# Patient Record
Sex: Male | Born: 1955 | Race: White | Hispanic: No | Smoking: Current every day smoker
Health system: Southern US, Community
[De-identification: ages and names within clinical notes are randomized; demographics above are authoritative.]

## PROBLEM LIST (undated history)

## (undated) DIAGNOSIS — I700 Atherosclerosis of aorta: Secondary | ICD-10-CM

## (undated) DIAGNOSIS — Z8673 Personal history of transient ischemic attack (TIA), and cerebral infarction without residual deficits: Secondary | ICD-10-CM

## (undated) DIAGNOSIS — M1990 Unspecified osteoarthritis, unspecified site: Secondary | ICD-10-CM

## (undated) DIAGNOSIS — IMO0001 Reserved for inherently not codable concepts without codable children: Secondary | ICD-10-CM

## (undated) DIAGNOSIS — F419 Anxiety disorder, unspecified: Secondary | ICD-10-CM

## (undated) DIAGNOSIS — I10 Essential (primary) hypertension: Secondary | ICD-10-CM

## (undated) DIAGNOSIS — I639 Cerebral infarction, unspecified: Secondary | ICD-10-CM

## (undated) DIAGNOSIS — F1910 Other psychoactive substance abuse, uncomplicated: Secondary | ICD-10-CM

## (undated) DIAGNOSIS — M549 Dorsalgia, unspecified: Secondary | ICD-10-CM

## (undated) DIAGNOSIS — R569 Unspecified convulsions: Secondary | ICD-10-CM

## (undated) DIAGNOSIS — H9190 Unspecified hearing loss, unspecified ear: Secondary | ICD-10-CM

## (undated) HISTORY — PX: INNER EAR SURGERY: SHX679

## (undated) HISTORY — DX: Atherosclerosis of aorta: I70.0

## (undated) HISTORY — DX: Personal history of transient ischemic attack (TIA), and cerebral infarction without residual deficits: Z86.73

---

## 1991-03-24 HISTORY — PX: BACK SURGERY: SHX140

## 1998-09-03 ENCOUNTER — Ambulatory Visit (HOSPITAL_COMMUNITY): Admission: RE | Admit: 1998-09-03 | Discharge: 1998-09-03 | Payer: Self-pay | Admitting: Neurosurgery

## 1998-09-03 ENCOUNTER — Encounter: Payer: Self-pay | Admitting: Neurosurgery

## 2001-03-23 HISTORY — PX: FINGER SURGERY: SHX640

## 2008-01-16 ENCOUNTER — Emergency Department (HOSPITAL_COMMUNITY): Admission: EM | Admit: 2008-01-16 | Discharge: 2008-01-16 | Payer: Self-pay | Admitting: Emergency Medicine

## 2008-06-23 ENCOUNTER — Emergency Department (HOSPITAL_COMMUNITY): Admission: EM | Admit: 2008-06-23 | Discharge: 2008-06-23 | Payer: Self-pay | Admitting: Emergency Medicine

## 2009-02-01 ENCOUNTER — Emergency Department (HOSPITAL_COMMUNITY): Admission: EM | Admit: 2009-02-01 | Discharge: 2009-02-02 | Payer: Self-pay | Admitting: Emergency Medicine

## 2010-04-16 ENCOUNTER — Emergency Department (HOSPITAL_COMMUNITY)
Admission: EM | Admit: 2010-04-16 | Discharge: 2010-04-16 | Payer: Self-pay | Source: Home / Self Care | Admitting: Emergency Medicine

## 2010-06-25 LAB — CBC
HCT: 47.4 % (ref 39.0–52.0)
Hemoglobin: 16.4 g/dL (ref 13.0–17.0)
MCHC: 34.5 g/dL (ref 30.0–36.0)
RDW: 13.8 % (ref 11.5–15.5)

## 2010-06-25 LAB — BASIC METABOLIC PANEL
CO2: 28 mEq/L (ref 19–32)
Calcium: 9.5 mg/dL (ref 8.4–10.5)
Glucose, Bld: 86 mg/dL (ref 70–99)
Sodium: 135 mEq/L (ref 135–145)

## 2010-06-25 LAB — RAPID URINE DRUG SCREEN, HOSP PERFORMED
Amphetamines: NOT DETECTED
Barbiturates: NOT DETECTED
Benzodiazepines: POSITIVE — AB
Cocaine: NOT DETECTED
Opiates: NOT DETECTED
Tetrahydrocannabinol: NOT DETECTED

## 2010-06-25 LAB — DIFFERENTIAL
Basophils Absolute: 0.1 10*3/uL (ref 0.0–0.1)
Basophils Relative: 1 % (ref 0–1)
Eosinophils Relative: 3 % (ref 0–5)
Monocytes Absolute: 0.7 10*3/uL (ref 0.1–1.0)

## 2011-06-05 ENCOUNTER — Other Ambulatory Visit (HOSPITAL_COMMUNITY): Payer: Self-pay | Admitting: Orthopaedic Surgery

## 2011-06-08 ENCOUNTER — Encounter (HOSPITAL_COMMUNITY): Payer: Self-pay | Admitting: Pharmacy Technician

## 2011-06-10 ENCOUNTER — Encounter (HOSPITAL_COMMUNITY): Payer: Self-pay

## 2011-06-10 ENCOUNTER — Encounter (HOSPITAL_COMMUNITY)
Admission: RE | Admit: 2011-06-10 | Discharge: 2011-06-10 | Disposition: A | Discharge status: Home / Self Care | Payer: PRIVATE HEALTH INSURANCE | Source: Ambulatory Visit | Attending: Orthopaedic Surgery | Admitting: Orthopaedic Surgery

## 2011-06-10 ENCOUNTER — Ambulatory Visit (HOSPITAL_COMMUNITY)
Admission: RE | Admit: 2011-06-10 | Discharge: 2011-06-10 | Disposition: A | Discharge status: Home / Self Care | Payer: PRIVATE HEALTH INSURANCE | Source: Ambulatory Visit | Attending: Anesthesiology | Admitting: Anesthesiology

## 2011-06-10 ENCOUNTER — Other Ambulatory Visit: Payer: Self-pay

## 2011-06-10 DIAGNOSIS — S43429A Sprain of unspecified rotator cuff capsule, initial encounter: Secondary | ICD-10-CM | POA: Insufficient documentation

## 2011-06-10 DIAGNOSIS — X58XXXA Exposure to other specified factors, initial encounter: Secondary | ICD-10-CM | POA: Insufficient documentation

## 2011-06-10 DIAGNOSIS — Z01818 Encounter for other preprocedural examination: Secondary | ICD-10-CM | POA: Insufficient documentation

## 2011-06-10 HISTORY — DX: Unspecified osteoarthritis, unspecified site: M19.90

## 2011-06-10 HISTORY — DX: Unspecified convulsions: R56.9

## 2011-06-10 HISTORY — DX: Unspecified hearing loss, unspecified ear: H91.90

## 2011-06-10 HISTORY — DX: Reserved for inherently not codable concepts without codable children: IMO0001

## 2011-06-10 HISTORY — DX: Dorsalgia, unspecified: M54.9

## 2011-06-10 HISTORY — DX: Anxiety disorder, unspecified: F41.9

## 2011-06-10 LAB — CBC
HCT: 41.9 % (ref 39.0–52.0)
MCHC: 35.1 g/dL (ref 30.0–36.0)
MCV: 96.1 fL (ref 78.0–100.0)
RDW: 12.2 % (ref 11.5–15.5)

## 2011-06-10 LAB — BASIC METABOLIC PANEL
BUN: 14 mg/dL (ref 6–23)
CO2: 30 mEq/L (ref 19–32)
Chloride: 91 mEq/L — ABNORMAL LOW (ref 96–112)
Creatinine, Ser: 1.06 mg/dL (ref 0.50–1.35)

## 2011-06-10 NOTE — Pre-Procedure Instructions (Signed)
20 BUD KAESER  06/10/2011   Your procedure is scheduled on:  Tues, Mar 26 @ 1230PM  Report to [facility] at 1030AM.  Call this number if you have problems the morning of surgery: [phone]   Remember:   Do not eat food:After Midnight.  May have clear liquids: up to 4 Hours before arrival.  Clear liquids include soda, tea, black coffee, apple or grape juice, broth.  Take these medicines the morning of surgery with A SIP OF WATER: Tenoretic,Valium,and Dilantin   Do not wear jewelry, make-up or nail polish.  Do not wear lotions, powders, or perfumes.    Do not bring valuables to the hospital.   Contacts, dentures or bridgework may not be worn into surgery.  Leave suitcase in the car. After surgery it may be brought to your room.  For patients admitted to the hospital, checkout time is 11:00 AM the day of discharge.   Patients discharged the day of surgery will not be allowed to drive home.    Special Instructions: CHG Shower Use Special Wash: 1/2 bottle night before surgery and 1/2 bottle morning of surgery.   Please read over the following fact sheets that you were given: Pain Booklet, Coughing and Deep Breathing, MRSA Information and Surgical Site Infection Prevention

## 2011-06-10 NOTE — Progress Notes (Deleted)
Pt denies ever having a heart attack but the health dept did an ekg about 6-62yrs ago and told him that he had had one

## 2011-06-10 NOTE — Progress Notes (Signed)
Pt doesn't have a cardiologist  Pt goes to [facility] for b/p meds  Denies having a heart cath/echo/stress test

## 2011-06-10 NOTE — Pre-Procedure Instructions (Signed)
20 Robert Walls  06/10/2011   Your procedure is scheduled on:  Tues, Mar 26 @ 1230pm  Report to [facility] at 1030 AM.  Call this number if you have problems the morning of surgery: [phone]   Remember:   Do not eat food:After Midnight.  May have clear liquids: up to 4 Hours before arrival.(until 6:30 am)  Clear liquids include soda, tea, black coffee, apple or grape juice, broth,water  Take these medicines the morning of surgery with A SIP OF WATER: Tenoretic,Dilantin,and Valium   Do not wear jewelry, make-up or nail polish.  Do not wear lotions, powders, or perfumes. You may wear deodorant.  Do not shave 48 hours prior to surgery.  Do not bring valuables to the hospital.  Contacts, dentures or bridgework may not be worn into surgery.  Leave suitcase in the car. After surgery it may be brought to your room.  For patients admitted to the hospital, checkout time is 11:00 AM the day of discharge.   Patients discharged the day of surgery will not be allowed to drive home.    Special Instructions: CHG Shower Use Special Wash: 1/2 bottle night before surgery and 1/2 bottle morning of surgery.   Please read over the following fact sheets that you were given: Pain Booklet, Coughing and Deep Breathing, MRSA Information and Surgical Site Infection Prevention

## 2011-06-10 NOTE — Progress Notes (Addendum)
Pt denies ever having a heart attack but states that 6-60yrs ago he had an EKG at the [facility] in [facility] that told him that he had   Requested ekg from [facility] for comparsion

## 2011-06-10 NOTE — Consult Note (Signed)
Anesthesia:  Patient is a 56 year old male scheduled for a right shoulder arthroscopy with debridement and possible rotator cuff tear repair on 06/16/11.  His history includes smoking, HTN, seizure (reportedly when he stopped Xanax too abruptly) and is now on Dilantin, history of concussion and spinal fracture in '93 after falling from a tree, anxiety, impaired hearing, arthritis, prior back surgery '93.  His PCP is through the [facility] Department.  Labs noted.  CXR no acute cardiopulmonary process.  EKG shows NSR, minimal voltage criteria for LVH, inferior T wave abnormality (III, avF).  These changes were present on an EKG from 06/25/10, but less apparent in 2010.  I called and spoke with Mr. Kimmons.  He denies CP, SOB.  He is not terribly active but continues to work in remodeling and can go up a flight of stairs without difficulty.    I reviewed above with Anesthesiologist Dr. Randa Evens.  Since he has had fairly recent (July 2012) and on-going evaluation by his PCP, has been asymptomatic, METS is >4 (probably a 5), and a stable EKG since last year, would plan to proceed unless there is a change in his status.

## 2011-06-10 NOTE — Progress Notes (Signed)
Per health dept no ekg from 6-60yrs ago on file but does have one from 2010/2012-to fax along with last visit

## 2011-06-15 MED ORDER — CEFAZOLIN SODIUM 1-5 GM-% IV SOLN
1.0000 g | INTRAVENOUS | Status: AC
  Administered 2011-06-16: 1 g via INTRAVENOUS
  Filled 2011-06-15: qty 50

## 2011-06-16 ENCOUNTER — Encounter (HOSPITAL_COMMUNITY): Payer: Self-pay | Admitting: Vascular Surgery

## 2011-06-16 ENCOUNTER — Ambulatory Visit (HOSPITAL_COMMUNITY)
Admission: RE | Admit: 2011-06-16 | Discharge: 2011-06-16 | Disposition: A | Discharge status: Home / Self Care | Payer: PRIVATE HEALTH INSURANCE | Source: Ambulatory Visit | Attending: Orthopaedic Surgery | Admitting: Orthopaedic Surgery

## 2011-06-16 ENCOUNTER — Encounter (HOSPITAL_COMMUNITY)
Admission: RE | Disposition: A | Discharge status: Home / Self Care | Payer: Self-pay | Source: Ambulatory Visit | Attending: Orthopaedic Surgery

## 2011-06-16 ENCOUNTER — Encounter (HOSPITAL_COMMUNITY): Payer: Self-pay | Admitting: *Deleted

## 2011-06-16 ENCOUNTER — Ambulatory Visit (HOSPITAL_COMMUNITY): Payer: PRIVATE HEALTH INSURANCE | Admitting: Vascular Surgery

## 2011-06-16 DIAGNOSIS — I10 Essential (primary) hypertension: Secondary | ICD-10-CM | POA: Insufficient documentation

## 2011-06-16 DIAGNOSIS — R569 Unspecified convulsions: Secondary | ICD-10-CM | POA: Insufficient documentation

## 2011-06-16 DIAGNOSIS — H9190 Unspecified hearing loss, unspecified ear: Secondary | ICD-10-CM | POA: Insufficient documentation

## 2011-06-16 DIAGNOSIS — F411 Generalized anxiety disorder: Secondary | ICD-10-CM | POA: Insufficient documentation

## 2011-06-16 DIAGNOSIS — M75101 Unspecified rotator cuff tear or rupture of right shoulder, not specified as traumatic: Secondary | ICD-10-CM

## 2011-06-16 DIAGNOSIS — M75120 Complete rotator cuff tear or rupture of unspecified shoulder, not specified as traumatic: Secondary | ICD-10-CM | POA: Insufficient documentation

## 2011-06-16 DIAGNOSIS — M12811 Other specific arthropathies, not elsewhere classified, right shoulder: Secondary | ICD-10-CM

## 2011-06-16 SURGERY — SHOULDER ARTHROSCOPY WITH ROTATOR CUFF REPAIR AND SUBACROMIAL DECOMPRESSION
Anesthesia: General | Site: Shoulder | Laterality: Right | Wound class: Clean

## 2011-06-16 MED ORDER — HYDROMORPHONE HCL PF 1 MG/ML IJ SOLN
0.2500 mg | INTRAMUSCULAR | Status: DC | PRN
  Administered 2011-06-16 (×4): 0.5 mg via INTRAVENOUS

## 2011-06-16 MED ORDER — PROPOFOL 10 MG/ML IV EMUL
INTRAVENOUS | Status: DC | PRN
  Administered 2011-06-16: 200 mg via INTRAVENOUS

## 2011-06-16 MED ORDER — ROCURONIUM BROMIDE 100 MG/10ML IV SOLN
INTRAVENOUS | Status: DC | PRN
  Administered 2011-06-16: 50 mg via INTRAVENOUS

## 2011-06-16 MED ORDER — FENTANYL CITRATE 0.05 MG/ML IJ SOLN
INTRAMUSCULAR | Status: DC | PRN
  Administered 2011-06-16: 50 ug via INTRAVENOUS
  Administered 2011-06-16 (×2): 100 ug via INTRAVENOUS

## 2011-06-16 MED ORDER — HYDROMORPHONE HCL PF 1 MG/ML IJ SOLN
INTRAMUSCULAR | Status: AC
  Filled 2011-06-16: qty 1

## 2011-06-16 MED ORDER — MIDAZOLAM HCL 5 MG/5ML IJ SOLN
INTRAMUSCULAR | Status: DC | PRN
  Administered 2011-06-16: 2 mg via INTRAVENOUS

## 2011-06-16 MED ORDER — VECURONIUM BROMIDE 10 MG IV SOLR
INTRAVENOUS | Status: DC | PRN
  Administered 2011-06-16: 2 mg via INTRAVENOUS
  Administered 2011-06-16: 3 mg via INTRAVENOUS
  Administered 2011-06-16: 1 mg via INTRAVENOUS

## 2011-06-16 MED ORDER — NEOSTIGMINE METHYLSULFATE 1 MG/ML IJ SOLN
INTRAMUSCULAR | Status: DC | PRN
  Administered 2011-06-16: 4 mg via INTRAVENOUS

## 2011-06-16 MED ORDER — BUPIVACAINE-EPINEPHRINE PF 0.5-1:200000 % IJ SOLN
INTRAMUSCULAR | Status: DC | PRN
  Administered 2011-06-16: 25 mL

## 2011-06-16 MED ORDER — ONDANSETRON HCL 4 MG/2ML IJ SOLN: 4.0000 mg | Freq: Once | INTRAMUSCULAR | Status: DC | PRN

## 2011-06-16 MED ORDER — LACTATED RINGERS IV SOLN
INTRAVENOUS | Status: DC
  Administered 2011-06-16 (×2): via INTRAVENOUS

## 2011-06-16 MED ORDER — GLYCOPYRROLATE 0.2 MG/ML IJ SOLN
INTRAMUSCULAR | Status: DC | PRN
  Administered 2011-06-16: 0.6 mg via INTRAVENOUS

## 2011-06-16 MED ORDER — OXYCODONE-ACETAMINOPHEN 5-325 MG PO TABS: 1.0000 | ORAL_TABLET | ORAL | Status: AC | PRN

## 2011-06-16 MED ORDER — SODIUM CHLORIDE 0.9 % IR SOLN
Status: DC | PRN
  Administered 2011-06-16: 3000 mL

## 2011-06-16 MED ORDER — EPHEDRINE SULFATE 50 MG/ML IJ SOLN
INTRAMUSCULAR | Status: DC | PRN
  Administered 2011-06-16: 15 mg via INTRAVENOUS
  Administered 2011-06-16 (×4): 5 mg via INTRAVENOUS

## 2011-06-16 MED ORDER — MORPHINE SULFATE 2 MG/ML IJ SOLN: 0.0500 mg/kg | INTRAMUSCULAR | Status: DC | PRN

## 2011-06-16 MED ORDER — ONDANSETRON HCL 4 MG/2ML IJ SOLN
INTRAMUSCULAR | Status: DC | PRN
  Administered 2011-06-16: 4 mg via INTRAVENOUS

## 2011-06-16 SURGICAL SUPPLY — 59 items
ANCH SUT 4.5 SUBCORTICAL WNG (Anchor) ×1 IMPLANT
ANCHOR PALADIN W/HIFI SUT 5.0 (Anchor) ×1 IMPLANT
ANCHOR SUT POPLOK 4.5 (Anchor) ×1 IMPLANT
BLADE CUDA 4.2 (BLADE) ×2 IMPLANT
BLADE SURG 11 STRL SS (BLADE) ×2 IMPLANT
BLADE SURG ROTATE 9660 (MISCELLANEOUS) IMPLANT
BUR OVAL 6.0 (BURR) ×1 IMPLANT
BUR VERTEX HOODED 4.5 (BURR) IMPLANT
CANNULA 5.75X7 CRYSTAL CLEAR (CANNULA) ×1 IMPLANT
CANNULA SHOULDER 7CM (CANNULA) ×4 IMPLANT
CANNULA TWIST IN 8.25X7CM (CANNULA) ×1 IMPLANT
CLOTH BEACON ORANGE TIMEOUT ST (SAFETY) ×2 IMPLANT
COVER SURGICAL LIGHT HANDLE (MISCELLANEOUS) ×2 IMPLANT
DECANTER SPIKE VIAL GLASS SM (MISCELLANEOUS) IMPLANT
DRAPE INCISE IOBAN 66X45 STRL (DRAPES) ×1 IMPLANT
DRAPE SHOULDER BEACH CHAIR (DRAPES) ×2 IMPLANT
DRAPE SURG 17X23 STRL (DRAPES) ×2 IMPLANT
DRAPE U-SHAPE 47X51 STRL (DRAPES) ×2 IMPLANT
DRSG PAD ABDOMINAL 8X10 ST (GAUZE/BANDAGES/DRESSINGS) ×3 IMPLANT
DURAPREP 26ML APPLICATOR (WOUND CARE) ×2 IMPLANT
ELECT REM PT RETURN 9FT ADLT (ELECTROSURGICAL)
ELECTRODE REM PT RTRN 9FT ADLT (ELECTROSURGICAL) IMPLANT
GAUZE XEROFORM 1X8 LF (GAUZE/BANDAGES/DRESSINGS) ×2 IMPLANT
GLOVE BIOGEL PI IND STRL 8 (GLOVE) ×1 IMPLANT
GLOVE BIOGEL PI INDICATOR 8 (GLOVE) ×1
GLOVE ORTHO TXT STRL SZ7.5 (GLOVE) ×2 IMPLANT
GOWN PREVENTION PLUS LG XLONG (DISPOSABLE) IMPLANT
GOWN PREVENTION PLUS XLARGE (GOWN DISPOSABLE) ×2 IMPLANT
GOWN STRL NON-REIN LRG LVL3 (GOWN DISPOSABLE) ×4 IMPLANT
KIT BASIN OR (CUSTOM PROCEDURE TRAY) ×2 IMPLANT
KIT ROOM TURNOVER OR (KITS) ×2 IMPLANT
KIT SHOULDER TRACTION (DRAPES) ×2 IMPLANT
MANIFOLD NEPTUNE II (INSTRUMENTS) ×2 IMPLANT
NDL 1/2 CIR CATGUT .05X1.09 (NEEDLE) IMPLANT
NDL HYPO 25GX1X1/2 BEV (NEEDLE) IMPLANT
NDL SCORPION (NEEDLE) IMPLANT
NDL SCORPION MULTI FIRE (NEEDLE) IMPLANT
NDL SPNL 18GX3.5 QUINCKE PK (NEEDLE) ×1 IMPLANT
NEEDLE 1/2 CIR CATGUT .05X1.09 (NEEDLE) IMPLANT
NEEDLE HYPO 25GX1X1/2 BEV (NEEDLE) IMPLANT
NEEDLE SCORPION (NEEDLE) IMPLANT
NEEDLE SCORPION MULTI FIRE (NEEDLE) ×2 IMPLANT
NEEDLE SPNL 18GX3.5 QUINCKE PK (NEEDLE) ×2 IMPLANT
NS IRRIG 1000ML POUR BTL (IV SOLUTION) ×2 IMPLANT
PACK SHOULDER (CUSTOM PROCEDURE TRAY) ×2 IMPLANT
PAD ARMBOARD 7.5X6 YLW CONV (MISCELLANEOUS) ×4 IMPLANT
SET ARTHROSCOPY TUBING (MISCELLANEOUS) ×2
SET ARTHROSCOPY TUBING LN (MISCELLANEOUS) ×1 IMPLANT
SPONGE GAUZE 4X4 12PLY (GAUZE/BANDAGES/DRESSINGS) ×2 IMPLANT
SPONGE LAP 4X18 X RAY DECT (DISPOSABLE) ×2 IMPLANT
STAPLER VISISTAT 35W (STAPLE) IMPLANT
STRIP CLOSURE SKIN 1/2X4 (GAUZE/BANDAGES/DRESSINGS) IMPLANT
SUT ETHILON 3 0 PS 1 (SUTURE) ×1 IMPLANT
SYR CONTROL 10ML LL (SYRINGE) ×1 IMPLANT
TAPE CLOTH SURG 6X10 WHT LF (GAUZE/BANDAGES/DRESSINGS) ×1 IMPLANT
TOWEL OR 17X24 6PK STRL BLUE (TOWEL DISPOSABLE) ×2 IMPLANT
TOWEL OR 17X26 10 PK STRL BLUE (TOWEL DISPOSABLE) ×2 IMPLANT
WAND 90 DEG TURBOVAC W/CORD (SURGICAL WAND) ×2 IMPLANT
WATER STERILE IRR 1000ML POUR (IV SOLUTION) ×2 IMPLANT

## 2011-06-16 NOTE — Progress Notes (Signed)
Orthopedic Tech Progress Note Patient Details:  Robert Walls August 31, 1955 [id number]  Other Ortho Devices Type of Ortho Device: Other (comment) Ortho Device Location: [name] shoulder sling Ortho Device Interventions: Application   [name] T 06/16/2011, 2:36 PM

## 2011-06-16 NOTE — Brief Op Note (Signed)
06/16/2011  2:30 PM  PATIENT:  Robert Walls  56 y.o. male  PRE-OPERATIVE DIAGNOSIS:  Right shoulder rotator cuff tear  POST-OPERATIVE DIAGNOSIS:  Right shoulder rotator cuff tear  PROCEDURE:  Procedure(s) (LRB): SHOULDER ARTHROSCOPY WITH ROTATOR CUFF REPAIR AND SUBACROMIAL DECOMPRESSION (Right)  SURGEON:  Surgeon(s) and Role:    * Kathryne Hitch, MD - Primary  PHYSICIAN ASSISTANT:   ASSISTANTS: none   ANESTHESIA:   regional and general  EBL:  Total I/O In: 1000 [I.V.:1000] Out: 50 [Blood:50]  BLOOD ADMINISTERED:none  DRAINS: none   LOCAL MEDICATIONS USED:  NONE  SPECIMEN:  No Specimen  DISPOSITION OF SPECIMEN:  N/A  COUNTS:  YES  TOURNIQUET:  * No tourniquets in log *  DICTATION: .Other Dictation: Dictation Number [id number]  PLAN OF CARE: Discharge to home after PACU  PATIENT DISPOSITION:  PACU - hemodynamically stable.   Delay start of Pharmacological VTE agent (>24hrs) due to surgical blood loss or risk of bleeding: not applicable

## 2011-06-16 NOTE — Preoperative (Signed)
Beta Blockers   Reason not to administer Beta Blockers:Not Applicable, last dose 06/16/11 at 05:30

## 2011-06-16 NOTE — Anesthesia Preprocedure Evaluation (Addendum)
Anesthesia Evaluation  Patient identified by MRN, date of birth, ID band Patient awake    Reviewed: Allergy & Precautions, H&P , NPO status , Patient's Chart, lab work & pertinent test results  Airway Mallampati: II TM Distance: >3 FB Neck ROM: Full    Dental  (+) Teeth Intact and Dental Advisory Given   Pulmonary  breath sounds clear to auscultation        Cardiovascular hypertension, Pt. on medications Rhythm:Regular Rate:Normal     Neuro/Psych Seizures -, Well Controlled,  Anxiety    GI/Hepatic   Endo/Other    Renal/GU      Musculoskeletal   Abdominal   Peds  Hematology   Anesthesia Other Findings   Reproductive/Obstetrics                          Anesthesia Physical Anesthesia Plan  ASA: II  Anesthesia Plan:    Post-op Pain Management:    Induction: Intravenous  Airway Management Planned: Oral ETT  Additional Equipment:   Intra-op Plan:   Post-operative Plan:   Informed Consent: I have reviewed the patients History and Physical, chart, labs and discussed the procedure including the risks, benefits and alternatives for the proposed anesthesia with the patient or authorized representative who has indicated his/her understanding and acceptance.   Dental advisory given  Plan Discussed with: CRNA, Anesthesiologist and Surgeon  Anesthesia Plan Comments:         Anesthesia Quick Evaluation

## 2011-06-16 NOTE — H&P (Signed)
Robert Walls is an 56 y.o. male.   Chief Complaint:   Right shoulder pain and weakness HPI:   56 yo male with right shoulder pain and weakness.  An ultrasound shows a full-thickness rotator cuff tear.  He wishes to proceed with a arthroscopic intervention to attempt to repair the cuff.  He understands fully the risks and benefits involved.  Past Medical History  Diagnosis Date  . Impaired hearing   . Hypertension     takes Tenoretic daily and Clonidine nightly  . Seizures     only 1 seizure 66yrs ago;takes Dilantin tid  . Concussion 1993  . Arthritis   . Back pain   . Joint pain   . Joint swelling   . Urinary frequency   . Nocturia   . Anxiety     takes Valium prn    Past Surgical History  Procedure Date  . Inner ear surgery     as a child  . Back surgery 1993  . Finger surgery 2003    left ring finger    Family History  Problem Relation Age of Onset  . Anesthesia problems Neg Hx   . Hypotension Neg Hx   . Malignant hyperthermia Neg Hx   . Pseudochol deficiency Neg Hx    Social History:  reports that he has been smoking.  He does not have any smokeless tobacco history on file. He reports that he drinks alcohol. He reports that he does not use illicit drugs.  Allergies:  Allergies  Allergen Reactions  . Lisinopril Other (See Comments)    Depleted sodium; resulted in hospitalization    Medications Prior to Admission  Medication Dose Route Frequency Provider Last Rate Last Dose  . ceFAZolin (ANCEF) IVPB 1 g/50 mL premix  1 g Intravenous 60 min Pre-Op Kathryne Hitch, MD      . lactated ringers infusion   Intravenous Continuous Rivka Barbara, MD 50 mL/hr at 06/16/11 1139     Medications Prior to Admission  Medication Sig Dispense Refill  . phenytoin (DILANTIN) 100 MG ER capsule Take 100 mg by mouth 3 (three) times daily.        No results found for this or any previous visit (from the past 48 hour(s)). No results found.  Review of Systems  All  other systems reviewed and are negative.    Blood pressure 162/98, pulse 66, temperature 98.5 F (36.9 C), temperature source Oral, resp. rate 20, SpO2 97.00%. Physical Exam  Constitutional: He is oriented to person, place, and time. He appears well-developed and well-nourished.  HENT:  Head: Normocephalic and atraumatic.  Eyes: EOM are normal. Pupils are equal, round, and reactive to light.  Neck: Normal range of motion. Neck supple.  Cardiovascular: Normal rate and regular rhythm.   Respiratory: Effort normal and breath sounds normal.  GI: Soft. Bowel sounds are normal.  Musculoskeletal:       Right shoulder: He exhibits decreased range of motion, tenderness and decreased strength.  Neurological: He is alert and oriented to person, place, and time.  Skin: Skin is warm and dry.  Psychiatric: He has a normal mood and affect.     Assessment/Plan To the OR for a right shoulder arthroscopy.  Kathryne Hitch 06/16/2011, 12:07 PM

## 2011-06-16 NOTE — Anesthesia Postprocedure Evaluation (Signed)
  Anesthesia Post-op Note  Patient: Robert Walls  Procedure(s) Performed: Procedure(s) (LRB): SHOULDER ARTHROSCOPY WITH ROTATOR CUFF REPAIR AND SUBACROMIAL DECOMPRESSION (Right)  Patient Location: PACU  Anesthesia Type: GA combined with regional for post-op pain  Level of Consciousness: awake, alert  and oriented  Airway and Oxygen Therapy: Patient Spontanous Breathing and Patient connected to nasal cannula oxygen  Post-op Pain: moderate  Post-op Assessment: Post-op Vital signs reviewed, Patient's Cardiovascular Status Stable, Respiratory Function Stable, Patent Airway, No signs of Nausea or vomiting and Pain level controlled  Post-op Vital Signs: Reviewed and stable  Complications: No apparent anesthesia complications

## 2011-06-16 NOTE — Discharge Instructions (Signed)
Instructions Following General Anesthetic, Adult A nurse specialized in giving anesthesia (anesthetist) or a doctor specialized in giving anesthesia (anesthesiologist) gave you a medicine that made you sleep while a procedure was performed. For as long as 24 hours following this procedure, you may feel:  Dizzy.   Weak.   Drowsy.  AFTER THE PROCEDURE After surgery, you will be taken to the recovery area where a nurse will monitor your progress. You will be allowed to go home when you are awake, stable, taking fluids well, and without complications. For the first 24 hours following an anesthetic:  Have a responsible person with you.   Do not drive a car. If you are alone, do not take public transportation.   Do not drink alcohol.   Do not take medicine that has not been prescribed by your caregiver.   Do not sign important papers or make important decisions.   You may resume normal diet and activities as directed.   Change bandages (dressings) as directed.   Only take over-the-counter or prescription medicines for pain, discomfort, or fever as directed by your caregiver.  If you have questions or problems that seem related to the anesthetic, call the hospital and ask for the anesthetist or anesthesiologist on call. SEEK IMMEDIATE MEDICAL CARE IF:   You develop a rash.   You have difficulty breathing.   You have chest pain.   You develop any allergic problems.  Document Released: 06/15/2000 Document Revised: 02/26/2011 Document Reviewed: 01/24/2007 Exit[employer] Patient Information 2012 [employer]. 

## 2011-06-16 NOTE — Anesthesia Procedure Notes (Addendum)
Anesthesia Regional Block:  Interscalene brachial plexus block  Pre-Anesthetic Checklist: ,, timeout performed, Correct Patient, Correct Site, Correct Laterality, Correct Procedure, Correct Position, site marked, Risks and benefits discussed,  Surgical consent,  Pre-op evaluation,  At surgeon's request and post-op pain management  Laterality: Right and Upper  Prep: chloraprep       Needles:  Injection technique: Single-shot  Needle Type: Echogenic Needle     Needle Length: 5cm 5 cm Needle Gauge: 22 and 22 G    Additional Needles:  Procedures: ultrasound guided Interscalene brachial plexus block Narrative:  Start time: 06/16/2011 12:21 PM End time: 06/16/2011 12:29 PM Injection made incrementally with aspirations every 5 mL.  Performed by: Personally  Anesthesiologist: Sheldon Silvan  Supraclavicular block Procedure Name: Intubation Date/Time: 06/16/2011 12:40 PM Performed by: De Nurse Pre-anesthesia Checklist: Patient identified, Emergency Drugs available, Suction available, Patient being monitored and Timeout performed Patient Re-evaluated:Patient Re-evaluated prior to inductionOxygen Delivery Method: Circle system utilized Preoxygenation: Pre-oxygenation with 100% oxygen Intubation Type: IV induction Ventilation: Mask ventilation without difficulty Laryngoscope Size: Mac and 4 Grade View: Grade I Tube type: Oral Tube size: 7.5 mm Number of attempts: 1 Airway Equipment and Method: Stylet Placement Confirmation: ETT inserted through vocal cords under direct vision,  positive ETCO2 and breath sounds checked- equal and bilateral Secured at: 23 cm Tube secured with: Tape Dental Injury: Teeth and Oropharynx as per pre-operative assessment

## 2011-06-16 NOTE — Transfer of Care (Signed)
Immediate Anesthesia Transfer of Care Note  Patient: Robert Walls  Procedure(s) Performed: Procedure(s) (LRB): SHOULDER ARTHROSCOPY WITH ROTATOR CUFF REPAIR AND SUBACROMIAL DECOMPRESSION (Right)  Patient Location: PACU  Anesthesia Type: General and GA combined with regional for post-op pain  Level of Consciousness: awake, alert  and oriented  Airway & Oxygen Therapy: Patient Spontanous Breathing and Patient connected to nasal cannula oxygen  Post-op Assessment: Report given to PACU RN and Post -op Vital signs reviewed and stable  Post vital signs: Reviewed and stable  Complications: No apparent anesthesia complications

## 2011-06-17 NOTE — Op Note (Signed)
NAME:  Robert Walls, REALI NO.:  [id number]  MEDICAL RECORD NO.:  [id number]  LOCATION:  [facility]                         FACILITY:  [facility]  PHYSICIAN:  Vanita Panda. Magnus Ivan, M.D.DATE OF BIRTH:  07-26-1955  DATE OF PROCEDURE:  06/16/2011 DATE OF DISCHARGE:  06/16/2011                              OPERATIVE REPORT   PREOPERATIVE DIAGNOSIS:  Right shoulder full-thickness rotator cuff tear.  POSTOPERATIVE DIAGNOSIS:  Right shoulder full-thickness rotator cuff tear.  PROCEDURE:  Right shoulder arthroscopy with extensive debridement, and arthroscopic rotator cuff repair.  SURGEON:  Vanita Panda. Magnus Ivan, MD  ANESTHESIA: 1. Regional right shoulder block. 2. General.  BLOOD LOSS:  Minimal.  COMPLICATIONS:  None.  INDICATIONS:  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  He has had shoulder pain for some time now.  He was sent to our office for vocational rehab hoping to rehabilitate his shoulder and get him back to a meaningful manual labor.  An ultrasound in our office did showed a full-thickness rotator cuff tear.  We recommended an arthroscopic intervention to the shoulder including debridement and repair the rotator cuff given his young age  and his desire to still perform at least moderate physical demand, labor and work with his arm overhead.  The risks and benefits of this were explained to him in detail, and he did wish to proceed with surgery.  PROCEDURE DESCRIPTION:  After informed consent was obtained, appropriate right shoulder was marked.  Anesthesia obtained with a regional shoulder block.  He was then brought to the operating room, placed supine on the operating table.  General anesthesia was then obtained.  He was then fashioned in a beach-chair position with appropriate positioning of the head and neck and padding of the down on operative left arm.  His right arm was then prepped  and draped with DuraPrep and sterile drapes and was placed in in-line skeletal traction with 45 degrees of forward flexion and neutral rotation.  A time-out was called, and he was identified as the correct patient, correct right shoulder.  I then made a posterior arthroscopy portal off the posterolateral edge of the acromion and glenohumeral joint.  Right away you could see there was significant partial tearing of the biceps tendon and the subscapularis.  To the rotator interval, I made an anterior incision and used a soft tissue ablation wand to debride significant synovitis in the glenohumeral joint as well as the undersurface of the rotator cuff, subscapularis, and biceps tendon.  Next, I entered the subpectoral space through the posterior portal and we could see that there was a full-thickness rotator cuff tear.  I cleaned the footprint of the rotator cuff using a high-speed bur and then arthroscopic shaver to clean the subacromial space. I also used a bur to perform partial acromioplasty.  I then made several portals for suture management.  I placed a Linvatec 5.0 suture anchor into the footprint area of the rotator cuff and then using the scorpion suture passer, placed horizontal mattress sutures from the front to the back of the rotator cuff.  I then tied these down over sliding knots and did  a supplemental PushLock on the subdeltoid area to bring the rotator cuff back down to a resting position.  I then removed all instrumentation and closed the portal sites with interrupted nylon suture.  Xeroform followed by well-padded sterile dressing was applied, and the patient's shoulder was placed in a shoulder abduction pillow sling.  He was then awakened, extubated, and taken to recovery room in stable condition.  All final counts were correct.  There were no complications noted.     Vanita Panda. Magnus Ivan, M.D.     CYB/MEDQ  D:  06/16/2011  T:  06/17/2011  Job:  [id number]

## 2012-12-21 ENCOUNTER — Telehealth: Payer: Self-pay | Admitting: Orthopedic Surgery

## 2012-12-21 ENCOUNTER — Encounter (HOSPITAL_COMMUNITY): Payer: Self-pay

## 2012-12-21 ENCOUNTER — Emergency Department (HOSPITAL_COMMUNITY)
Admission: EM | Admit: 2012-12-21 | Discharge: 2012-12-21 | Disposition: A | Discharge status: Home / Self Care | Payer: Self-pay | Attending: Emergency Medicine | Admitting: Emergency Medicine

## 2012-12-21 DIAGNOSIS — Y9389 Activity, other specified: Secondary | ICD-10-CM | POA: Insufficient documentation

## 2012-12-21 DIAGNOSIS — S39012A Strain of muscle, fascia and tendon of lower back, initial encounter: Secondary | ICD-10-CM

## 2012-12-21 DIAGNOSIS — Z8739 Personal history of other diseases of the musculoskeletal system and connective tissue: Secondary | ICD-10-CM | POA: Insufficient documentation

## 2012-12-21 DIAGNOSIS — G40909 Epilepsy, unspecified, not intractable, without status epilepticus: Secondary | ICD-10-CM | POA: Insufficient documentation

## 2012-12-21 DIAGNOSIS — F411 Generalized anxiety disorder: Secondary | ICD-10-CM | POA: Insufficient documentation

## 2012-12-21 DIAGNOSIS — G8929 Other chronic pain: Secondary | ICD-10-CM | POA: Insufficient documentation

## 2012-12-21 DIAGNOSIS — Z8669 Personal history of other diseases of the nervous system and sense organs: Secondary | ICD-10-CM | POA: Insufficient documentation

## 2012-12-21 DIAGNOSIS — F17200 Nicotine dependence, unspecified, uncomplicated: Secondary | ICD-10-CM | POA: Insufficient documentation

## 2012-12-21 DIAGNOSIS — Y929 Unspecified place or not applicable: Secondary | ICD-10-CM | POA: Insufficient documentation

## 2012-12-21 DIAGNOSIS — I10 Essential (primary) hypertension: Secondary | ICD-10-CM | POA: Insufficient documentation

## 2012-12-21 DIAGNOSIS — S335XXA Sprain of ligaments of lumbar spine, initial encounter: Secondary | ICD-10-CM | POA: Insufficient documentation

## 2012-12-21 DIAGNOSIS — Z79899 Other long term (current) drug therapy: Secondary | ICD-10-CM | POA: Insufficient documentation

## 2012-12-21 DIAGNOSIS — X500XXA Overexertion from strenuous movement or load, initial encounter: Secondary | ICD-10-CM | POA: Insufficient documentation

## 2012-12-21 MED ORDER — TRAMADOL HCL 50 MG PO TABS: 50.0000 mg | ORAL_TABLET | Freq: Four times a day (QID) | ORAL | Status: DC | PRN

## 2012-12-21 MED ORDER — PREDNISONE 10 MG PO TABS: ORAL_TABLET | ORAL | Status: DC

## 2012-12-21 MED ORDER — METHOCARBAMOL 500 MG PO TABS: ORAL_TABLET | ORAL | Status: DC

## 2012-12-21 NOTE — ED Notes (Signed)
Pt reports back pain for several years, denies any new or recent injury,

## 2012-12-21 NOTE — ED Notes (Signed)
Pt with chronic lower back pain but pain has been gradually increasing, denies new injury but his work requires repetitive movements that worsen pain, took hydrocodone 10mg  this morning with min. Relief, describes pain as aching

## 2012-12-21 NOTE — Telephone Encounter (Signed)
This is not a problem we see

## 2012-12-21 NOTE — Telephone Encounter (Signed)
i do not see any xrays  Have any been done ??

## 2012-12-21 NOTE — Telephone Encounter (Signed)
Robert Walls (Joshua Prosch's dad) went to [facility] for back pain.  He is requesting to be seen here as a followup.  Said Dr. Jeral Fruit put a rod in his back around 1994.  Told him I did not think we would be able to schedule here but would ask you.  He will be self pay and is aware of the minimum payment Please advise

## 2012-12-22 NOTE — ED Provider Notes (Signed)
CSN: [id number]     Arrival date & time 12/21/12  1121 History   First MD Initiated Contact with Patient 12/21/12 1133     Chief Complaint  Patient presents with  . Back Pain   (Consider location/radiation/quality/duration/timing/severity/associated sxs/prior Treatment) Patient is a 57 y.o. male presenting with back pain. The history is provided by the patient.  Back Pain Location:  Lumbar spine Quality:  Aching Radiates to:  Does not radiate Pain severity:  Moderate Pain is:  Same all the time Onset quality:  Gradual Timing:  Constant Progression:  Worsening Chronicity:  Chronic Context comment:  Patient reports bending over and lifting frequently at work Relieved by:  Nothing Worsened by:  Bending and twisting Ineffective treatments:  Heating pad and OTC medications Associated symptoms: no abdominal pain, no abdominal swelling, no bladder incontinence, no bowel incontinence, no chest pain, no dysuria, no fever, no headaches, no leg pain, no numbness, no paresthesias, no pelvic pain, no perianal numbness, no tingling and no weakness   Risk factors comment:  Hx of chronic low back pain since 1994, had "rods" placed in his lower back secondary to spinal fractures   Past Medical History  Diagnosis Date  . Impaired hearing   . Hypertension     takes Tenoretic daily and Clonidine nightly  . Seizures     only 1 seizure 6yrs ago;takes Dilantin tid  . Concussion 1993  . Arthritis   . Back pain   . Joint pain   . Joint swelling   . Urinary frequency   . Nocturia   . Anxiety     takes Valium prn   Past Surgical History  Procedure Laterality Date  . Inner ear surgery      as a child  . Back surgery  1993  . Finger surgery  2003    left ring finger   Family History  Problem Relation Age of Onset  . Anesthesia problems Neg Hx   . Hypotension Neg Hx   . Malignant hyperthermia Neg Hx   . Pseudochol deficiency Neg Hx    History  Substance Use Topics  . Smoking status:  Current Every Day Smoker -- 0.50 packs/day for 8 years    Types: Cigarettes  . Smokeless tobacco: Not on file  . Alcohol Use: Yes     Comment: weekends    Review of Systems  Constitutional: Negative for fever.  Respiratory: Negative for shortness of breath.   Cardiovascular: Negative for chest pain.  Gastrointestinal: Negative for vomiting, abdominal pain, constipation and bowel incontinence.  Genitourinary: Negative for bladder incontinence, dysuria, hematuria, flank pain, decreased urine volume, difficulty urinating and pelvic pain.       No perineal numbness or incontinence of urine or feces  Musculoskeletal: Positive for back pain. Negative for joint swelling.  Skin: Negative for rash.  Neurological: Negative for tingling, weakness, numbness, headaches and paresthesias.  All other systems reviewed and are negative.    Allergies  Lisinopril  Home Medications   Current Outpatient Rx  Name  Route  Sig  Dispense  Refill  . atenolol-chlorthalidone (TENORETIC) 50-25 MG per tablet   Oral   Take 1 tablet by mouth every morning.         . cloNIDine (CATAPRES) 0.1 MG tablet   Oral   Take 0.1 mg by mouth every evening.         . diazepam (VALIUM) 5 MG tablet   Oral   Take 5 mg by mouth every 8 (eight)  hours as needed. For anxiety         . folic acid (FOLVITE) 1 MG tablet   Oral   Take 1 mg by mouth daily.         . methocarbamol (ROBAXIN) 500 MG tablet      2 tablets by mouth 3 times a day prn muscle spasms   42 tablet   0   . phenytoin (DILANTIN) 100 MG ER capsule   Oral   Take 100 mg by mouth 3 (three) times daily.         . predniSONE (DELTASONE) 10 MG tablet      Take 6 tablets day one, 5 tablets day two, 4 tablets day three, 3 tablets day four, 2 tablets day five, then 1 tablet day six   21 tablet   0   . thiamine (VITAMIN B-1) 100 MG tablet   Oral   Take 100 mg by mouth daily.          BP 168/105  Pulse 80  Temp(Src) 98.1 F (36.7 C)  (Oral)  Resp 20  Ht 5\' 7"  (1.702 m)  Wt 160 lb (72.576 kg)  BMI 25.05 kg/m2  SpO2 99% Physical Exam  Nursing note and vitals reviewed. Constitutional: He is oriented to person, place, and time. He appears well-developed and well-nourished. No distress.  HENT:  Head: Normocephalic and atraumatic.  Neck: Normal range of motion. Neck supple.  Cardiovascular: Normal rate, regular rhythm, normal heart sounds and intact distal pulses.   No murmur heard. Pulmonary/Chest: Effort normal and breath sounds normal. No respiratory distress.  Abdominal: Soft. He exhibits no distension. There is no tenderness.  Musculoskeletal: He exhibits tenderness. He exhibits no edema.       Lumbar back: He exhibits tenderness and pain. He exhibits normal range of motion, no swelling, no deformity, no laceration and normal pulse.  ttp of the  lumbar spine and paraspinal muscles. Well healed surgical scars that extend from lower thoracic to lumbar region. DP pulses are brisk and symmetrical.  Distal sensation intact.  Hip Flexors/Extensors are intact  Neurological: He is alert and oriented to person, place, and time. He has normal strength. No sensory deficit. He exhibits normal muscle tone. Coordination and gait normal.  Reflex Scores:      Patellar reflexes are 2+ on the right side and 2+ on the left side.      Achilles reflexes are 2+ on the right side and 2+ on the left side. Skin: Skin is warm and dry. No rash noted.    ED Course  Procedures (including critical care time) Labs Review Labs Reviewed - No data to display Imaging Review No results found.  MDM   1. Lumbar strain, initial encounter    Patient has worsening of chronic low back pain.  Reports frequent bending and lifting at his job.  Patient ambulates well.  No focal neuro deficits.  No concerning sx's for emergent neurological or infectious process.   Patient requesting referral to Dr. Mort Sawyers office.  VSS.  Appears stable for discharge.       Paton Crum L. Trisha Mangle, PA-C 12/22/12 2120

## 2012-12-22 NOTE — Telephone Encounter (Signed)
Advised the patient of Dr. Mort Sawyers reply

## 2012-12-24 NOTE — ED Provider Notes (Signed)
Medical screening examination/treatment/procedure(s) were performed by non-physician practitioner and as supervising physician I was immediately available for consultation/collaboration.  Taeshaun Rames M Fahmida Jurich, MD 12/24/12 0903 

## 2013-09-20 DIAGNOSIS — Z8673 Personal history of transient ischemic attack (TIA), and cerebral infarction without residual deficits: Secondary | ICD-10-CM

## 2013-09-20 HISTORY — DX: Personal history of transient ischemic attack (TIA), and cerebral infarction without residual deficits: Z86.73

## 2013-10-09 ENCOUNTER — Emergency Department (HOSPITAL_COMMUNITY): Payer: Medicaid Other

## 2013-10-09 ENCOUNTER — Inpatient Hospital Stay (HOSPITAL_COMMUNITY)
Admission: EM | Admit: 2013-10-09 | Discharge: 2013-10-13 | DRG: 064 | Disposition: A | Discharge status: Home / Self Care | Payer: Medicaid Other | Attending: Family Medicine | Admitting: Family Medicine

## 2013-10-09 ENCOUNTER — Encounter (HOSPITAL_COMMUNITY): Payer: Self-pay | Admitting: Emergency Medicine

## 2013-10-09 DIAGNOSIS — R4182 Altered mental status, unspecified: Secondary | ICD-10-CM

## 2013-10-09 DIAGNOSIS — F1020 Alcohol dependence, uncomplicated: Secondary | ICD-10-CM | POA: Diagnosis present

## 2013-10-09 DIAGNOSIS — I10 Essential (primary) hypertension: Secondary | ICD-10-CM | POA: Diagnosis present

## 2013-10-09 DIAGNOSIS — Z23 Encounter for immunization: Secondary | ICD-10-CM

## 2013-10-09 DIAGNOSIS — H9190 Unspecified hearing loss, unspecified ear: Secondary | ICD-10-CM | POA: Diagnosis present

## 2013-10-09 DIAGNOSIS — G9340 Encephalopathy, unspecified: Secondary | ICD-10-CM | POA: Diagnosis present

## 2013-10-09 DIAGNOSIS — I639 Cerebral infarction, unspecified: Secondary | ICD-10-CM

## 2013-10-09 DIAGNOSIS — G40909 Epilepsy, unspecified, not intractable, without status epilepticus: Secondary | ICD-10-CM | POA: Diagnosis present

## 2013-10-09 DIAGNOSIS — F1210 Cannabis abuse, uncomplicated: Secondary | ICD-10-CM | POA: Diagnosis present

## 2013-10-09 DIAGNOSIS — F17200 Nicotine dependence, unspecified, uncomplicated: Secondary | ICD-10-CM | POA: Diagnosis present

## 2013-10-09 DIAGNOSIS — N179 Acute kidney failure, unspecified: Secondary | ICD-10-CM | POA: Diagnosis present

## 2013-10-09 DIAGNOSIS — M129 Arthropathy, unspecified: Secondary | ICD-10-CM | POA: Diagnosis present

## 2013-10-09 DIAGNOSIS — I6350 Cerebral infarction due to unspecified occlusion or stenosis of unspecified cerebral artery: Secondary | ICD-10-CM | POA: Diagnosis present

## 2013-10-09 DIAGNOSIS — Z8673 Personal history of transient ischemic attack (TIA), and cerebral infarction without residual deficits: Secondary | ICD-10-CM

## 2013-10-09 DIAGNOSIS — F1010 Alcohol abuse, uncomplicated: Secondary | ICD-10-CM

## 2013-10-09 DIAGNOSIS — R569 Unspecified convulsions: Secondary | ICD-10-CM

## 2013-10-09 DIAGNOSIS — F1011 Alcohol abuse, in remission: Secondary | ICD-10-CM

## 2013-10-09 HISTORY — DX: Essential (primary) hypertension: I10

## 2013-10-09 LAB — CBC WITH DIFFERENTIAL/PLATELET
BASOS PCT: 0 % (ref 0–1)
Basophils Absolute: 0 10*3/uL (ref 0.0–0.1)
EOS ABS: 0.2 10*3/uL (ref 0.0–0.7)
EOS PCT: 3 % (ref 0–5)
HCT: 40.4 % (ref 39.0–52.0)
Hemoglobin: 13.9 g/dL (ref 13.0–17.0)
LYMPHS ABS: 2.3 10*3/uL (ref 0.7–4.0)
Lymphocytes Relative: 29 % (ref 12–46)
MCH: 33 pg (ref 26.0–34.0)
MCHC: 34.4 g/dL (ref 30.0–36.0)
MCV: 96 fL (ref 78.0–100.0)
MONOS PCT: 8 % (ref 3–12)
Monocytes Absolute: 0.6 10*3/uL (ref 0.1–1.0)
Neutro Abs: 4.6 10*3/uL (ref 1.7–7.7)
Neutrophils Relative %: 60 % (ref 43–77)
PLATELETS: 197 10*3/uL (ref 150–400)
RBC: 4.21 MIL/uL — AB (ref 4.22–5.81)
RDW: 12.3 % (ref 11.5–15.5)
WBC: 7.8 10*3/uL (ref 4.0–10.5)

## 2013-10-09 LAB — URINE MICROSCOPIC-ADD ON

## 2013-10-09 LAB — COMPREHENSIVE METABOLIC PANEL
ALBUMIN: 3.8 g/dL (ref 3.5–5.2)
ALT: 20 U/L (ref 0–53)
ANION GAP: 11 (ref 5–15)
AST: 21 U/L (ref 0–37)
Alkaline Phosphatase: 69 U/L (ref 39–117)
BUN: 25 mg/dL — AB (ref 6–23)
CALCIUM: 8.9 mg/dL (ref 8.4–10.5)
CO2: 27 mEq/L (ref 19–32)
Chloride: 99 mEq/L (ref 96–112)
Creatinine, Ser: 1.72 mg/dL — ABNORMAL HIGH (ref 0.50–1.35)
GFR calc non Af Amer: 43 mL/min — ABNORMAL LOW (ref >90)
GFR, EST AFRICAN AMERICAN: 49 mL/min — AB (ref >90)
GLUCOSE: 103 mg/dL — AB (ref 70–99)
Potassium: 4.3 mEq/L (ref 3.7–5.3)
SODIUM: 137 meq/L (ref 137–147)
TOTAL PROTEIN: 7.4 g/dL (ref 6.0–8.3)
Total Bilirubin: 0.5 mg/dL (ref 0.3–1.2)

## 2013-10-09 LAB — URINALYSIS, ROUTINE W REFLEX MICROSCOPIC
Bilirubin Urine: NEGATIVE
Glucose, UA: NEGATIVE mg/dL
Ketones, ur: NEGATIVE mg/dL
LEUKOCYTES UA: NEGATIVE
NITRITE: NEGATIVE
PROTEIN: NEGATIVE mg/dL
Specific Gravity, Urine: 1.01 (ref 1.005–1.030)
UROBILINOGEN UA: 0.2 mg/dL (ref 0.0–1.0)
pH: 6 (ref 5.0–8.0)

## 2013-10-09 LAB — RAPID URINE DRUG SCREEN, HOSP PERFORMED
Amphetamines: NOT DETECTED
Barbiturates: NOT DETECTED
Benzodiazepines: POSITIVE — AB
Cocaine: NOT DETECTED
OPIATES: NOT DETECTED
TETRAHYDROCANNABINOL: POSITIVE — AB

## 2013-10-09 LAB — TROPONIN I: Troponin I: <0.3 ng/mL (ref <0.30)

## 2013-10-09 MED ORDER — THIAMINE HCL 100 MG/ML IJ SOLN
Freq: Once | INTRAVENOUS | Status: AC
  Administered 2013-10-09: 22:00:00 via INTRAVENOUS
  Filled 2013-10-09: qty 1000

## 2013-10-09 MED ORDER — LEVETIRACETAM 500 MG/5ML IV SOLN
INTRAVENOUS | Status: AC
  Filled 2013-10-09: qty 10

## 2013-10-09 MED ORDER — THIAMINE HCL 100 MG/ML IJ SOLN
INTRAMUSCULAR | Status: AC
  Filled 2013-10-09: qty 2

## 2013-10-09 MED ORDER — ONDANSETRON HCL 4 MG/2ML IJ SOLN
4.0000 mg | Freq: Four times a day (QID) | INTRAMUSCULAR | Status: DC | PRN
  Administered 2013-10-11: 4 mg via INTRAVENOUS
  Filled 2013-10-09: qty 2

## 2013-10-09 MED ORDER — M.V.I. ADULT IV INJ
INJECTION | INTRAVENOUS | Status: AC
  Filled 2013-10-09: qty 10

## 2013-10-09 MED ORDER — ONDANSETRON HCL 4 MG PO TABS: 4.0000 mg | ORAL_TABLET | Freq: Four times a day (QID) | ORAL | Status: DC | PRN

## 2013-10-09 MED ORDER — LEVETIRACETAM IN NACL 1000 MG/100ML IV SOLN
1000.0000 mg | Freq: Once | INTRAVENOUS | Status: AC
  Administered 2013-10-09: 1000 mg via INTRAVENOUS
  Filled 2013-10-09: qty 100

## 2013-10-09 MED ORDER — FOLIC ACID 5 MG/ML IJ SOLN
INTRAMUSCULAR | Status: AC
  Filled 2013-10-09: qty 0.2

## 2013-10-09 MED ORDER — ACETAMINOPHEN 325 MG PO TABS
650.0000 mg | ORAL_TABLET | Freq: Four times a day (QID) | ORAL | Status: DC | PRN
  Administered 2013-10-09 – 2013-10-10 (×2): 650 mg via ORAL
  Filled 2013-10-09 (×2): qty 2

## 2013-10-09 MED ORDER — TRAZODONE HCL 50 MG PO TABS
50.0000 mg | ORAL_TABLET | Freq: Every evening | ORAL | Status: DC | PRN
  Administered 2013-10-09 – 2013-10-12 (×4): 50 mg via ORAL
  Filled 2013-10-09 (×4): qty 1

## 2013-10-09 MED ORDER — SODIUM CHLORIDE 0.9 % IV SOLN
INTRAVENOUS | Status: DC
  Administered 2013-10-10 – 2013-10-11 (×3): via INTRAVENOUS

## 2013-10-09 MED ORDER — ASPIRIN 325 MG PO TABS
325.0000 mg | ORAL_TABLET | Freq: Every day | ORAL | Status: DC
  Administered 2013-10-09 – 2013-10-12 (×4): 325 mg via ORAL
  Filled 2013-10-09 (×4): qty 1

## 2013-10-09 MED ORDER — CLONIDINE HCL 0.2 MG PO TABS
0.3000 mg | ORAL_TABLET | Freq: Two times a day (BID) | ORAL | Status: DC
  Administered 2013-10-09 – 2013-10-10 (×2): 0.3 mg via ORAL
  Filled 2013-10-09 (×4): qty 1

## 2013-10-09 MED ORDER — LEVETIRACETAM IN NACL 500 MG/100ML IV SOLN
500.0000 mg | Freq: Two times a day (BID) | INTRAVENOUS | Status: DC
  Administered 2013-10-10 – 2013-10-12 (×6): 500 mg via INTRAVENOUS
  Filled 2013-10-09 (×8): qty 100

## 2013-10-09 MED ORDER — HEPARIN SODIUM (PORCINE) 5000 UNIT/ML IJ SOLN
5000.0000 [IU] | Freq: Three times a day (TID) | INTRAMUSCULAR | Status: DC
  Administered 2013-10-09 – 2013-10-13 (×9): 5000 [IU] via SUBCUTANEOUS
  Filled 2013-10-09 (×10): qty 1

## 2013-10-09 MED ORDER — LORAZEPAM 2 MG/ML IJ SOLN
1.0000 mg | INTRAMUSCULAR | Status: DC | PRN
  Administered 2013-10-10 – 2013-10-13 (×10): 1 mg via INTRAVENOUS
  Filled 2013-10-09 (×9): qty 1

## 2013-10-09 MED ORDER — SODIUM CHLORIDE 0.9 % IV SOLN: INTRAVENOUS | Status: DC

## 2013-10-09 NOTE — ED Provider Notes (Signed)
CSN: [id number]     Arrival date & time 10/09/13  1328 History  This chart was scribed for Glynn Octave, MD by Ardelia Mems, ED Scribe. This patient was seen in room APA04/APA04 and the patient's care was started at 2:30 PM.  Chief Complaint  Patient presents with  . Memory Loss    The history is provided by the patient. No language interpreter was used.    HPI Comments: Robert Walls is a 58 y.o. male with a history of HTN and seizures who presents to the Emergency Department complaining of memory loss. Pt was admitted last week for 3 days at [facility] after having had a seizure. Pt's son states that pt's memory has been very poor since being discharged from [place]. Pt's son states that he had 2 additional seizures last night. Pt reports that he had a history of seizures 2 years ago, which started recurring frequently over the past week. He states that he has been prescribed Dilantin in the past. Per pt's son, pt has been confused and asking repetitive questions since his initial seizure 1 week ago. Pt is complaining of a headache and lightheadness currently. He also states that he noticed bleeding out of right ear last night. He states that he began taking Clonidine for blood pressure after being discharged from [facility]. He states that he was not given any anti-seizure medications. He states that he is an occasionall alcohol user, (drinks 2 40s occasionally) and a former daily alcohol user. He denies any chest pain, abdominal pain, back pain,    Past Medical History  Diagnosis Date  . Impaired hearing   . Hypertension     takes Tenoretic daily and Clonidine nightly  . Seizures     only 1 seizure 56yrs ago;takes Dilantin tid  . Concussion 1993  . Arthritis   . Back pain   . Joint pain   . Joint swelling   . Urinary frequency   . Nocturia   . Anxiety     takes Valium prn   Past Surgical History  Procedure Laterality Date  . Inner ear surgery      as a child  . Back surgery   1993  . Finger surgery  2003    left ring finger   Family History  Problem Relation Age of Onset  . Anesthesia problems Neg Hx   . Hypotension Neg Hx   . Malignant hyperthermia Neg Hx   . Pseudochol deficiency Neg Hx    History  Substance Use Topics  . Smoking status: Current Every Day Smoker -- 0.50 packs/day for 8 years    Types: Cigarettes  . Smokeless tobacco: Not on file  . Alcohol Use: Yes     Comment: weekends    Review of Systems A complete 10 system review of systems was obtained and all systems are negative except as noted in the HPI and PMH.   Allergies  Lisinopril  Home Medications   Prior to Admission medications   Medication Sig Start Date End Date Taking? Authorizing Provider  Aspirin-Acetaminophen-Caffeine (GOODY HEADACHE PO) Take 1 packet by mouth 2 (two) times daily as needed (headaches/ backache).   Yes Historical Provider, MD  cloNIDine (CATAPRES) 0.3 MG tablet Take 0.3 mg by mouth 2 (two) times daily.   Yes Historical Provider, MD  traMADol (ULTRAM) 50 MG tablet Take 50 mg by mouth once.   Yes Historical Provider, MD   Triage Vitals: BP 144/74  Pulse 69  Temp(Src) 98.8 F (37.1  C) (Oral)  Ht 5\' 7"  (1.702 m)  Wt 155 lb (70.308 kg)  BMI 24.27 kg/m2  SpO2 99%  Physical Exam  Nursing note and vitals reviewed. Constitutional: He is oriented to person, place, and time. He appears well-developed and well-nourished. No distress.  HENT:  Head: Normocephalic and atraumatic.  Mouth/Throat: Oropharynx is clear and moist. No oropharyngeal exudate.  Right ear has dried blood in the canal. TM not visible. No active bleeding. No tragus or mastoid pain. Negative battle sign.   Eyes: Conjunctivae and EOM are normal. Pupils are equal, round, and reactive to light.  Neck: Normal range of motion. Neck supple.  Diffuse paraspinal C-spine pain  Cardiovascular: Normal rate, regular rhythm, normal heart sounds and intact distal pulses.   No murmur  heard. Pulmonary/Chest: Effort normal and breath sounds normal. No respiratory distress.  Abdominal: Soft. There is no tenderness. There is no rebound and no guarding.  Musculoskeletal: Normal range of motion. He exhibits no edema and no tenderness.  Neurological: He is alert and oriented to person, place, and time. No cranial nerve deficit. He exhibits normal muscle tone. Coordination normal.  No ataxia on finger to nose bilaterally. No pronator drift. 5/5 strength throughout. CN 2-12 intact. Negative Romberg. Equal grip strength. Sensation intact.  Skin: Skin is warm.  Appears pale  Psychiatric: He has a normal mood and affect. His behavior is normal.    ED Course  Procedures (including critical care time)  DIAGNOSTIC STUDIES: Oxygen Saturation is 99% on RA, normal by my interpretation.    COORDINATION OF CARE: 2:41 PM- Pt advised of plan for treatment and pt agrees.  Labs Review Labs Reviewed  CBC WITH DIFFERENTIAL - Abnormal; Notable for the following:    RBC 4.21 (*)    All other components within normal limits  COMPREHENSIVE METABOLIC PANEL - Abnormal; Notable for the following:    Glucose, Bld 103 (*)    BUN 25 (*)    Creatinine, Ser 1.72 (*)    GFR calc non Af Amer 43 (*)    GFR calc Af Amer 49 (*)    All other components within normal limits  URINALYSIS, ROUTINE W REFLEX MICROSCOPIC - Abnormal; Notable for the following:    APPearance HAZY (*)    Hgb urine dipstick MODERATE (*)    All other components within normal limits  URINE RAPID DRUG SCREEN (HOSP PERFORMED) - Abnormal; Notable for the following:    Benzodiazepines POSITIVE (*)    Tetrahydrocannabinol POSITIVE (*)    All other components within normal limits  URINE MICROSCOPIC-ADD ON - Abnormal; Notable for the following:    Squamous Epithelial / LPF FEW (*)    Bacteria, UA FEW (*)    Casts GRANULAR CAST (*)    All other components within normal limits  TROPONIN I  COMPREHENSIVE METABOLIC PANEL  CBC   PROTIME-INR  LIPID PANEL    Imaging Review Ct Head Wo Contrast  10/09/2013   CLINICAL DATA:  Seizures. Memory loss. Hit head during a seizure with bleeding from right ear. Neck pain, headache, and lightheadedness.  EXAM: CT HEAD WITHOUT CONTRAST  CT TEMPORAL BONES WITHOUT CONTRAST  CT CERVICAL SPINE WITHOUT CONTRAST  TECHNIQUE: Multidetector CT imaging of the head, temporal bones, and cervical spine were performed using the standard protocol without intravenous contrast. Multiplanar CT image reconstructions of the cervical spine and temporal bones were also generated.  COMPARISON:  None.  FINDINGS: CT HEAD FINDINGS  There is no evidence of acute cortical infarct,  intracranial hemorrhage, mass, midline shift, or extra-axial fluid collection. There is mild cerebral atrophy. Patchy periventricular white matter hypoattenuation is nonspecific but is compatible with minimal chronic small vessel ischemic disease. The visualized orbits are unremarkable. Visualized paranasal sinuses are clear. There are bilateral mastoid effusions.  CT TEMPORAL BONES FINDINGS  RIGHT: A small amount of debris is present in the external auditory canal. Superior portion of the tympanic membrane is not well seen. There is a moderate amount of fluid/ soft tissue throughout the tympanic cavity. The malleus and incus appear intact. The stapes is partially obscured by fluid/soft tissue. The internal auditory canal, cochlea, vestibule, and semicircular canals are unremarkable. The mastoid air cells are completely opacified. There are areas of osseous thinning involving the tegmen mastoideum, and dehiscence cannot be excluded.  LEFT: Focal soft tissue thickening/ cerumen is noted along the superior aspect of the external auditory canal. Tympanic membrane does not appear thickened, with portions of it are not well seen. There is a small amount of fluid/soft tissue in the tympanic cavity, predominantly in the epitympanum. The ossicles appear  intact. The ossicles appear intact. The tympanic cavity is clear. The internal auditory canal, cochlea, vestibule, and semicircular canals are unremarkable. There is complete opacification of the mastoid air cells. There are areas of osseous thinning involving the tegmen mastoideum, and dehiscence cannot be excluded.  CT CERVICAL SPINE FINDINGS  Vertebral alignment is normal. Vertebral body heights are preserved without compression fracture. Prevertebral soft tissues are within normal limits. Moderate anterior osteophyte formation is present at C5-6 and C6-7. Mild disc space narrowing is noted at these two levels, with small amount of vacuum disc phenomenon present at C5-6. Moderate osseous overgrowth is noted at the atlantodental articulation. No acute cervical spine fracture is identified. Mild to moderate facet arthrosis is noted in the mid to upper cervical spine. Mild biapical pleural thickening is partially visualized.  IMPRESSION: 1. No evidence of acute intracranial abnormality. 2. Complete opacification of the mastoid air cells bilaterally with partial opacification of the right greater than left middle year. No temporal bone fracture is identified, and findings may reflect otomastoiditis. 3. No acute osseous abnormality identified in the cervical spine.   Electronically Signed   By: Sebastian Ache   On: 10/09/2013 16:26   Ct Cervical Spine Wo Contrast  10/09/2013   CLINICAL DATA:  Seizures. Memory loss. Hit head during a seizure with bleeding from right ear. Neck pain, headache, and lightheadedness.  EXAM: CT HEAD WITHOUT CONTRAST  CT TEMPORAL BONES WITHOUT CONTRAST  CT CERVICAL SPINE WITHOUT CONTRAST  TECHNIQUE: Multidetector CT imaging of the head, temporal bones, and cervical spine were performed using the standard protocol without intravenous contrast. Multiplanar CT image reconstructions of the cervical spine and temporal bones were also generated.  COMPARISON:  None.  FINDINGS: CT HEAD FINDINGS   There is no evidence of acute cortical infarct, intracranial hemorrhage, mass, midline shift, or extra-axial fluid collection. There is mild cerebral atrophy. Patchy periventricular white matter hypoattenuation is nonspecific but is compatible with minimal chronic small vessel ischemic disease. The visualized orbits are unremarkable. Visualized paranasal sinuses are clear. There are bilateral mastoid effusions.  CT TEMPORAL BONES FINDINGS  RIGHT: A small amount of debris is present in the external auditory canal. Superior portion of the tympanic membrane is not well seen. There is a moderate amount of fluid/ soft tissue throughout the tympanic cavity. The malleus and incus appear intact. The stapes is partially obscured by fluid/soft tissue. The internal auditory canal,  cochlea, vestibule, and semicircular canals are unremarkable. The mastoid air cells are completely opacified. There are areas of osseous thinning involving the tegmen mastoideum, and dehiscence cannot be excluded.  LEFT: Focal soft tissue thickening/ cerumen is noted along the superior aspect of the external auditory canal. Tympanic membrane does not appear thickened, with portions of it are not well seen. There is a small amount of fluid/soft tissue in the tympanic cavity, predominantly in the epitympanum. The ossicles appear intact. The ossicles appear intact. The tympanic cavity is clear. The internal auditory canal, cochlea, vestibule, and semicircular canals are unremarkable. There is complete opacification of the mastoid air cells. There are areas of osseous thinning involving the tegmen mastoideum, and dehiscence cannot be excluded.  CT CERVICAL SPINE FINDINGS  Vertebral alignment is normal. Vertebral body heights are preserved without compression fracture. Prevertebral soft tissues are within normal limits. Moderate anterior osteophyte formation is present at C5-6 and C6-7. Mild disc space narrowing is noted at these two levels, with small  amount of vacuum disc phenomenon present at C5-6. Moderate osseous overgrowth is noted at the atlantodental articulation. No acute cervical spine fracture is identified. Mild to moderate facet arthrosis is noted in the mid to upper cervical spine. Mild biapical pleural thickening is partially visualized.  IMPRESSION: 1. No evidence of acute intracranial abnormality. 2. Complete opacification of the mastoid air cells bilaterally with partial opacification of the right greater than left middle year. No temporal bone fracture is identified, and findings may reflect otomastoiditis. 3. No acute osseous abnormality identified in the cervical spine.   Electronically Signed   By: Sebastian Ache   On: 10/09/2013 16:26   Mr Brain Wo Contrast  10/09/2013   CLINICAL DATA:  58 year old male with recent seizure activity. Headache and memory loss. Initial encounter.  EXAM: MRI HEAD WITHOUT CONTRAST  TECHNIQUE: Multiplanar, multiecho pulse sequences of the brain and surrounding structures were obtained without intravenous contrast.  COMPARISON:  Head, cervical spine and temporal bone CT without contrast 10/09/2013.  FINDINGS: Small curvilinear focus of restricted diffusion in the dorsal left thalamus very near but not definitely affecting the tail of the left hippocampus (series 100, image 17 and series 101, image 18). No hippocampal complex restricted diffusion. Small left of midline linear superior cerebellar artery region focus of restricted diffusion (series 100, image 12 and series 101, image 16).  Furthermore there is a subtle area of left insula diffusion restriction suspected on series 100, image 18.  No right hemisphere diffusion restriction. Major intracranial vascular flow voids are preserved, with some intracranial artery dolichoectasia.  Bilateral mastoid effusions, with facilitated diffusion. Mild paranasal sinus inflammatory changes. Negative visualized nasopharynx. Visualized orbit soft tissues are within normal  limits.  Chronic T2 and FLAIR hyperintensity in the right cerebellar ganglia (series 7, image 6). Scattered nonspecific cerebral white matter T2 and FLAIR hyperintensity, most pronounced in the anterior frontal lobes and greater on the left. There is also fairly extensive chronic encephalomalacia of the inferior right temporal lobe at the fusiform gyrus (series 10, image 25). T2 signal in the hippocampal complexes is within normal limits and symmetric.  No acute or chronic intracranial hemorrhage identified. No midline shift, mass effect, or evidence of intracranial mass lesion. No ventriculomegaly. Negative pituitary. Negative cervicomedullary junction and visualized cervical spine.  Normal bone marrow signal. Visualized scalp soft tissues are within normal limits. Tortuous cervical carotid arteries.  IMPRESSION: 1. 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 These are compatible with recent left posterior fossa small vessel ischemia. No associated mass effect or hemorrhage. 2. Superimposed small acute to subacute left insula infarct. This is in the left MCA territory, and could reflect synchronous small vessel disease or indicate recent Mild embolic event. 3. Nonspecific right temporal lobe encephalomalacia. Scattered nonspecific cerebral and cerebellar white matter signal abnormality. 4. Intra and extracranial artery dolichoectasia. 5. Bilateral mastoid effusions, appear to be simple/sterile.   Electronically Signed   By: Augusto Gamble M.D.   On: 10/09/2013 18:52   Ct Temporal Bones W/o Cm  10/09/2013   CLINICAL DATA:  Seizures. Memory loss. Hit head during a seizure with bleeding from right ear. Neck pain, headache, and lightheadedness.  EXAM: CT HEAD WITHOUT CONTRAST  CT TEMPORAL BONES WITHOUT CONTRAST  CT CERVICAL SPINE WITHOUT CONTRAST  TECHNIQUE: Multidetector CT imaging of the head, temporal bones, and cervical  spine were performed using the standard protocol without intravenous contrast. Multiplanar CT image reconstructions of the cervical spine and temporal bones were also generated.  COMPARISON:  None.  FINDINGS: CT HEAD FINDINGS  There is no evidence of acute cortical infarct, intracranial hemorrhage, mass, midline shift, or extra-axial fluid collection. There is mild cerebral atrophy. Patchy periventricular white matter hypoattenuation is nonspecific but is compatible with minimal chronic small vessel ischemic disease. The visualized orbits are unremarkable. Visualized paranasal sinuses are clear. There are bilateral mastoid effusions.  CT TEMPORAL BONES FINDINGS  RIGHT: A small amount of debris is present in the external auditory canal. Superior portion of the tympanic membrane is not well seen. There is a moderate amount of fluid/ soft tissue throughout the tympanic cavity. The malleus and incus appear intact. The stapes is partially obscured by fluid/soft tissue. The internal auditory canal, cochlea, vestibule, and semicircular canals are unremarkable. The mastoid air cells are completely opacified. There are areas of osseous thinning involving the tegmen mastoideum, and dehiscence cannot be excluded.  LEFT: Focal soft tissue thickening/ cerumen is noted along the superior aspect of the external auditory canal. Tympanic membrane does not appear thickened, with portions of it are not well seen. There is a small amount of fluid/soft tissue in the tympanic cavity, predominantly in the epitympanum. The ossicles appear intact. The ossicles appear intact. The tympanic cavity is clear. The internal auditory canal, cochlea, vestibule, and semicircular canals are unremarkable. There is complete opacification of the mastoid air cells. There are areas of osseous thinning involving the tegmen mastoideum, and dehiscence cannot be excluded.  CT CERVICAL SPINE FINDINGS  Vertebral alignment is normal. Vertebral body heights are  preserved without compression fracture. Prevertebral soft tissues are within normal limits. Moderate anterior osteophyte formation is present at C5-6 and C6-7. Mild disc space narrowing is noted at these two levels, with small amount of vacuum disc phenomenon present at C5-6. Moderate osseous overgrowth is noted at the atlantodental articulation. No acute cervical spine fracture is identified. Mild to moderate facet arthrosis is noted in the mid to upper cervical spine. Mild biapical pleural thickening is partially visualized.  IMPRESSION: 1. No evidence of acute intracranial abnormality. 2. Complete opacification of the mastoid air cells bilaterally with partial opacification of the right greater than left middle year. No temporal bone fracture is identified, and findings may reflect otomastoiditis. 3. No acute osseous abnormality identified in the cervical spine.   Electronically Signed   By: Sebastian Ache   On: 10/09/2013 16:26     EKG Interpretation   Date/Time:  Monday October 09 2013 14:54:28 EDT Ventricular Rate:  55 PR Interval:  148 QRS Duration: 86 QT Interval:  413 QTC Calculation: 395 R Axis:   81 Text Interpretation:  Sinus rhythm Probable LVH with secondary repol abnrm  Anterior Q waves, possibly due to LVH T wave inversions laterally  Confirmed by Manus GunningANCOUR  MD, Tin Engram [phone]([id number]) on 10/09/2013 4:42:52 PM      MDM   Final diagnoses:  Altered mental status, unspecified altered mental status type  Seizure disorder   Altered mental status for the past week. Admission to the [facility] for seizures discharged 3 days ago. History of seizures in the past but does not take medication. Denies heavy alcohol abuse. Denies regular Ultram use.  [facility] records obtained. Patient was admitted with suspected alcohol withdrawal. He had normal CT head. He was admitted for IV hydration and alcohol withdrawal protocol. He received Ativan twice. His Klonopin was increased. He was referred to  neurology as an outpatient.  CT head is negative.  no evidence of skull fracture. Temporal bones appear to be intact. There is complete opacification of mastoids bilaterally.  Case is discussed with Dr. Gerilyn Pilgrimoonquah of neurology who agrees with 1 g loading dose of Keppra and MRI Dr Karilyn CotaGosrani will admit patient.  Do not suspect patient's seizures are due to alcohol withdrawal as does not drink daily.  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 These may be responsible for patient's seizures. No evidence of alcohol withdrawal currently.      I personally performed the services described in this documentation, which was scribed in my presence. The recorded information has been reviewed and is accurate.   Glynn OctaveStephen Terril Chestnut, MD 10/09/13 2135

## 2013-10-09 NOTE — ED Notes (Signed)
Hospitalist in to eval  ?

## 2013-10-09 NOTE — ED Notes (Signed)
Pt and son are concerned that pt is have memory issues. Pt states he can remember bits and pieces about things and situations but at times cannot recall anything that has happened

## 2013-10-09 NOTE — ED Notes (Signed)
[facility] patient had memory loss which improved during hospital stay and was discharged 7/17. Since discharge patients son reports patient memory has become very poor, pt often repeats same questions, comments. Pt suffered two seizures last night, which per son lasted around 3-4 minutes each, during one of which he hit the right side of his head on couch. Right ear bleeding and headache started Sunday after seizures. Patient cannot recall any events from this weekend. Can not recall what he was doing prior to coming to ER although with sons prompting was able to recall going to his sisters. Short term memory intact to person, place. Pt unable to recall tidbits of information from long term, such as residence. Per son pt is normally "sharp" memory.

## 2013-10-09 NOTE — H&P (Signed)
[facility] History and Physical  Robert Walls ZOX:[id number] DOB: 21-Aug-1955 DOA: 10/09/2013  Referring physician: ER PCP: No PCP Per Patient   Chief Complaint: Altered mental status.  HPI: Robert Walls is a 57 y.o. male  This is a 59 year old man who presents with altered mental status. According to the ER notes, family member had witnessed the patient had 2 seizures yesterday. The patient apparently has had seizures before and these were thought perhaps to do with alcohol withdrawal. The patient tells me that he drinks two 24 ounce cans of beer almost every night. He has not had any alcohol in the last 3 nights. This morning he was confused and still appears to be confused by his own admission, but thinks he is getting better. He is now being admitted for further investigation and management.   Review of Systems:  Constitutional:  No weight loss, night sweats, Fevers, chills, fatigue.  HEENT:  No headaches, Difficulty swallowing,Tooth/dental problems,Sore throat,  No sneezing, itching, ear ache, nasal congestion, post nasal drip,  Cardio-vascular:  No chest pain, Orthopnea, PND, swelling in lower extremities, anasarca, dizziness, palpitations  GI:  No heartburn, indigestion, abdominal pain, nausea, vomiting, diarrhea, change in bowel habits, loss of appetite  Resp:  No shortness of breath with exertion or at rest. No excess mucus, no productive cough, No non-productive cough, No coughing up of blood.No change in color of mucus.No wheezing.No chest wall deformity  Skin:  no rash or lesions.  GU:  no dysuria, change in color of urine, no urgency or frequency. No flank pain.  Musculoskeletal:  No joint pain or swelling. No decreased range of motion. No back pain.  Psych:  No change in mood or affect. No depression or anxiety. No memory loss.   Past Medical History  Diagnosis Date  . Impaired hearing   . Hypertension     takes Tenoretic daily and Clonidine nightly  .  Seizures     only 1 seizure 31yrs ago;takes Dilantin tid  . Concussion 1993  . Arthritis   . Back pain   . Joint pain   . Joint swelling   . Urinary frequency   . Nocturia   . Anxiety     takes Valium prn   Past Surgical History  Procedure Laterality Date  . Inner ear surgery      as a child  . Back surgery  1993  . Finger surgery  2003    left ring finger   Social History:  reports that he has been smoking Cigarettes.  He has a 4 pack-year smoking history. He does not have any smokeless tobacco history on file. He reports that he drinks alcohol. He reports that he uses illicit drugs (Marijuana).  Allergies  Allergen Reactions  . Lisinopril Other (See Comments)    Depleted sodium; resulted in hospitalization, with the HCTZ     Family History  Problem Relation Age of Onset  . Anesthesia problems Neg Hx   . Hypotension Neg Hx   . Malignant hyperthermia Neg Hx   . Pseudochol deficiency Neg Hx      Prior to Admission medications   Medication Sig Start Date End Date Taking? Authorizing Provider  Aspirin-Acetaminophen-Caffeine (GOODY HEADACHE PO) Take 1 packet by mouth 2 (two) times daily as needed (headaches/ backache).   Yes Historical Provider, MD  cloNIDine (CATAPRES) 0.3 MG tablet Take 0.3 mg by mouth 2 (two) times daily.   Yes Historical Provider, MD  traMADol (ULTRAM) 50 MG tablet  Take 50 mg by mouth once.   Yes Historical Provider, MD   Physical Exam: Filed Vitals:   10/09/13 1332 10/09/13 1737  BP: 144/74 166/95  Pulse: 69 52  Temp: 98.8 F (37.1 C) 98 F (36.7 C)  TempSrc: Oral Oral  Resp:  14  Height: 5\' 7"  (1.702 m)   Weight: 70.308 kg (155 lb)   SpO2: 99%     Wt Readings from Last 3 Encounters:  10/09/13 70.308 kg (155 lb)  12/21/12 72.576 kg (160 lb)  06/16/11 77.565 kg (171 lb)    General:  Appears calm and comfortable. Appears to be alert and orientated with me in the emergency room. Eyes: PERRL, normal lids, irises & conjunctiva ENT: grossly  normal hearing, lips & tongue Neck: no LAD, masses or thyromegaly Cardiovascular: RRR, no m/r/g. No LE edema. Telemetry: SR, no arrhythmias  Respiratory: CTA bilaterally, no w/r/r. Normal respiratory effort. Abdomen: soft, ntnd Skin: no rash or induration seen on limited exam Musculoskeletal: grossly normal tone BUE/BLE Psychiatric: grossly normal mood and affect, speech fluent and appropriate Neurologic: grossly non-focal.          Labs on Admission:  Basic Metabolic Panel:  Recent Labs Lab 10/09/13 1516  NA 137  K 4.3  CL 99  CO2 27  GLUCOSE 103*  BUN 25*  CREATININE 1.72*  CALCIUM 8.9   Liver Function Tests:  Recent Labs Lab 10/09/13 1516  AST 21  ALT 20  ALKPHOS 69  BILITOT 0.5  PROT 7.4  ALBUMIN 3.8   No results found for this basename: LIPASE, AMYLASE,  in the last 168 hours No results found for this basename: AMMONIA,  in the last 168 hours CBC:  Recent Labs Lab 10/09/13 1516  WBC 7.8  NEUTROABS 4.6  HGB 13.9  HCT 40.4  MCV 96.0  PLT 197   Cardiac Enzymes:  Recent Labs Lab 10/09/13 1516  TROPONINI <0.30    BNP (last 3 results) No results found for this basename: PROBNP,  in the last 8760 hours CBG: No results found for this basename: GLUCAP,  in the last 168 hours  Radiological Exams on Admission: Ct Head Wo Contrast  10/09/2013   CLINICAL DATA:  Seizures. Memory loss. Hit head during a seizure with bleeding from right ear. Neck pain, headache, and lightheadedness.  EXAM: CT HEAD WITHOUT CONTRAST  CT TEMPORAL BONES WITHOUT CONTRAST  CT CERVICAL SPINE WITHOUT CONTRAST  TECHNIQUE: Multidetector CT imaging of the head, temporal bones, and cervical spine were performed using the standard protocol without intravenous contrast. Multiplanar CT image reconstructions of the cervical spine and temporal bones were also generated.  COMPARISON:  None.  FINDINGS: CT HEAD FINDINGS  There is no evidence of acute cortical infarct, intracranial hemorrhage,  mass, midline shift, or extra-axial fluid collection. There is mild cerebral atrophy. Patchy periventricular white matter hypoattenuation is nonspecific but is compatible with minimal chronic small vessel ischemic disease. The visualized orbits are unremarkable. Visualized paranasal sinuses are clear. There are bilateral mastoid effusions.  CT TEMPORAL BONES FINDINGS  RIGHT: A small amount of debris is present in the external auditory canal. Superior portion of the tympanic membrane is not well seen. There is a moderate amount of fluid/ soft tissue throughout the tympanic cavity. The malleus and incus appear intact. The stapes is partially obscured by fluid/soft tissue. The internal auditory canal, cochlea, vestibule, and semicircular canals are unremarkable. The mastoid air cells are completely opacified. There are areas of osseous thinning involving the tegmen mastoideum,  and dehiscence cannot be excluded.  LEFT: Focal soft tissue thickening/ cerumen is noted along the superior aspect of the external auditory canal. Tympanic membrane does not appear thickened, with portions of it are not well seen. There is a small amount of fluid/soft tissue in the tympanic cavity, predominantly in the epitympanum. The ossicles appear intact. The ossicles appear intact. The tympanic cavity is clear. The internal auditory canal, cochlea, vestibule, and semicircular canals are unremarkable. There is complete opacification of the mastoid air cells. There are areas of osseous thinning involving the tegmen mastoideum, and dehiscence cannot be excluded.  CT CERVICAL SPINE FINDINGS  Vertebral alignment is normal. Vertebral body heights are preserved without compression fracture. Prevertebral soft tissues are within normal limits. Moderate anterior osteophyte formation is present at C5-6 and C6-7. Mild disc space narrowing is noted at these two levels, with small amount of vacuum disc phenomenon present at C5-6. Moderate osseous  overgrowth is noted at the atlantodental articulation. No acute cervical spine fracture is identified. Mild to moderate facet arthrosis is noted in the mid to upper cervical spine. Mild biapical pleural thickening is partially visualized.  IMPRESSION: 1. No evidence of acute intracranial abnormality. 2. Complete opacification of the mastoid air cells bilaterally with partial opacification of the right greater than left middle year. No temporal bone fracture is identified, and findings may reflect otomastoiditis. 3. No acute osseous abnormality identified in the cervical spine.   Electronically Signed   By: Sebastian Ache   On: 10/09/2013 16:26   Ct Cervical Spine Wo Contrast  10/09/2013   CLINICAL DATA:  Seizures. Memory loss. Hit head during a seizure with bleeding from right ear. Neck pain, headache, and lightheadedness.  EXAM: CT HEAD WITHOUT CONTRAST  CT TEMPORAL BONES WITHOUT CONTRAST  CT CERVICAL SPINE WITHOUT CONTRAST  TECHNIQUE: Multidetector CT imaging of the head, temporal bones, and cervical spine were performed using the standard protocol without intravenous contrast. Multiplanar CT image reconstructions of the cervical spine and temporal bones were also generated.  COMPARISON:  None.  FINDINGS: CT HEAD FINDINGS  There is no evidence of acute cortical infarct, intracranial hemorrhage, mass, midline shift, or extra-axial fluid collection. There is mild cerebral atrophy. Patchy periventricular white matter hypoattenuation is nonspecific but is compatible with minimal chronic small vessel ischemic disease. The visualized orbits are unremarkable. Visualized paranasal sinuses are clear. There are bilateral mastoid effusions.  CT TEMPORAL BONES FINDINGS  RIGHT: A small amount of debris is present in the external auditory canal. Superior portion of the tympanic membrane is not well seen. There is a moderate amount of fluid/ soft tissue throughout the tympanic cavity. The malleus and incus appear intact. The  stapes is partially obscured by fluid/soft tissue. The internal auditory canal, cochlea, vestibule, and semicircular canals are unremarkable. The mastoid air cells are completely opacified. There are areas of osseous thinning involving the tegmen mastoideum, and dehiscence cannot be excluded.  LEFT: Focal soft tissue thickening/ cerumen is noted along the superior aspect of the external auditory canal. Tympanic membrane does not appear thickened, with portions of it are not well seen. There is a small amount of fluid/soft tissue in the tympanic cavity, predominantly in the epitympanum. The ossicles appear intact. The ossicles appear intact. The tympanic cavity is clear. The internal auditory canal, cochlea, vestibule, and semicircular canals are unremarkable. There is complete opacification of the mastoid air cells. There are areas of osseous thinning involving the tegmen mastoideum, and dehiscence cannot be excluded.  CT CERVICAL SPINE FINDINGS  Vertebral alignment is normal. Vertebral body heights are preserved without compression fracture. Prevertebral soft tissues are within normal limits. Moderate anterior osteophyte formation is present at C5-6 and C6-7. Mild disc space narrowing is noted at these two levels, with small amount of vacuum disc phenomenon present at C5-6. Moderate osseous overgrowth is noted at the atlantodental articulation. No acute cervical spine fracture is identified. Mild to moderate facet arthrosis is noted in the mid to upper cervical spine. Mild biapical pleural thickening is partially visualized.  IMPRESSION: 1. No evidence of acute intracranial abnormality. 2. Complete opacification of the mastoid air cells bilaterally with partial opacification of the right greater than left middle year. No temporal bone fracture is identified, and findings may reflect otomastoiditis. 3. No acute osseous abnormality identified in the cervical spine.   Electronically Signed   By: Sebastian Ache   On:  10/09/2013 16:26   Mr Brain Wo Contrast  10/09/2013   CLINICAL DATA:  58 year old male with recent seizure activity. Headache and memory loss. Initial encounter.  EXAM: MRI HEAD WITHOUT CONTRAST  TECHNIQUE: Multiplanar, multiecho pulse sequences of the brain and surrounding structures were obtained without intravenous contrast.  COMPARISON:  Head, cervical spine and temporal bone CT without contrast 10/09/2013.  FINDINGS: Small curvilinear focus of restricted diffusion in the dorsal left thalamus very near but not definitely affecting the tail of the left hippocampus (series 100, image 17 and series 101, image 18). No hippocampal complex restricted diffusion. Small left of midline linear superior cerebellar artery region focus of restricted diffusion (series 100, image 12 and series 101, image 16).  Furthermore there is a subtle area of left insula diffusion restriction suspected on series 100, image 18.  No right hemisphere diffusion restriction. Major intracranial vascular flow voids are preserved, with some intracranial artery dolichoectasia.  Bilateral mastoid effusions, with facilitated diffusion. Mild paranasal sinus inflammatory changes. Negative visualized nasopharynx. Visualized orbit soft tissues are within normal limits.  Chronic T2 and FLAIR hyperintensity in the right cerebellar ganglia (series 7, image 6). Scattered nonspecific cerebral white matter T2 and FLAIR hyperintensity, most pronounced in the anterior frontal lobes and greater on the left. There is also fairly extensive chronic encephalomalacia of the inferior right temporal lobe at the fusiform gyrus (series 10, image 25). T2 signal in the hippocampal complexes is within normal limits and symmetric.  No acute or chronic intracranial hemorrhage identified. No midline shift, mass effect, or evidence of intracranial mass lesion. No ventriculomegaly. Negative pituitary. Negative cervicomedullary junction and visualized cervical spine.  Normal  bone marrow signal. Visualized scalp soft tissues are within normal limits. Tortuous cervical carotid arteries.  IMPRESSION: 1. 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 These are compatible with recent left posterior fossa small vessel ischemia. No associated mass effect or hemorrhage. 2. Superimposed small acute to subacute left insula infarct. This is in the left MCA territory, and could reflect synchronous small vessel disease or indicate recent Mild embolic event. 3. Nonspecific right temporal lobe encephalomalacia. Scattered nonspecific cerebral and cerebellar white matter signal abnormality. 4. Intra and extracranial artery dolichoectasia. 5. Bilateral mastoid effusions, appear to be simple/sterile.   Electronically Signed   By: Augusto Gamble M.D.   On: 10/09/2013 18:52   Ct Temporal Bones W/o Cm  10/09/2013   CLINICAL DATA:  Seizures. Memory loss. Hit head during a seizure with bleeding from right ear. Neck pain, headache, and lightheadedness.  EXAM: CT  HEAD WITHOUT CONTRAST  CT TEMPORAL BONES WITHOUT CONTRAST  CT CERVICAL SPINE WITHOUT CONTRAST  TECHNIQUE: Multidetector CT imaging of the head, temporal bones, and cervical spine were performed using the standard protocol without intravenous contrast. Multiplanar CT image reconstructions of the cervical spine and temporal bones were also generated.  COMPARISON:  None.  FINDINGS: CT HEAD FINDINGS  There is no evidence of acute cortical infarct, intracranial hemorrhage, mass, midline shift, or extra-axial fluid collection. There is mild cerebral atrophy. Patchy periventricular white matter hypoattenuation is nonspecific but is compatible with minimal chronic small vessel ischemic disease. The visualized orbits are unremarkable. Visualized paranasal sinuses are clear. There are bilateral mastoid effusions.  CT TEMPORAL BONES FINDINGS  RIGHT: A small amount of debris is  present in the external auditory canal. Superior portion of the tympanic membrane is not well seen. There is a moderate amount of fluid/ soft tissue throughout the tympanic cavity. The malleus and incus appear intact. The stapes is partially obscured by fluid/soft tissue. The internal auditory canal, cochlea, vestibule, and semicircular canals are unremarkable. The mastoid air cells are completely opacified. There are areas of osseous thinning involving the tegmen mastoideum, and dehiscence cannot be excluded.  LEFT: Focal soft tissue thickening/ cerumen is noted along the superior aspect of the external auditory canal. Tympanic membrane does not appear thickened, with portions of it are not well seen. There is a small amount of fluid/soft tissue in the tympanic cavity, predominantly in the epitympanum. The ossicles appear intact. The ossicles appear intact. The tympanic cavity is clear. The internal auditory canal, cochlea, vestibule, and semicircular canals are unremarkable. There is complete opacification of the mastoid air cells. There are areas of osseous thinning involving the tegmen mastoideum, and dehiscence cannot be excluded.  CT CERVICAL SPINE FINDINGS  Vertebral alignment is normal. Vertebral body heights are preserved without compression fracture. Prevertebral soft tissues are within normal limits. Moderate anterior osteophyte formation is present at C5-6 and C6-7. Mild disc space narrowing is noted at these two levels, with small amount of vacuum disc phenomenon present at C5-6. Moderate osseous overgrowth is noted at the atlantodental articulation. No acute cervical spine fracture is identified. Mild to moderate facet arthrosis is noted in the mid to upper cervical spine. Mild biapical pleural thickening is partially visualized.  IMPRESSION: 1. No evidence of acute intracranial abnormality. 2. Complete opacification of the mastoid air cells bilaterally with partial opacification of the right greater  than left middle year. No temporal bone fracture is identified, and findings may reflect otomastoiditis. 3. No acute osseous abnormality identified in the cervical spine.   Electronically Signed   By: Sebastian AcheAllen  Grady   On: 10/09/2013 16:26     Assessment/Plan   1. Altered mental status with probable seizures yesterday. MRI brain scan shows the presence of small acute to subacute infarcts in the dorsal left thalamus and superior left cerebellum. There is also a superimposed small acute to subacute left insula infarct. 2. Ongoing alcohol abuse based on current alcohol quantity consumption. 3. Hypertension.  Plan: 1. Admit to telemetry. 2. Stroke workup including echocardiogram, carotid Dopplers. 3. Neurology consultation. 4. Intravenous Keppra for further prevention of seizure. 5. Watch for alcohol withdrawal, does not have any features at the present time.  Further recommendations will depend on patient's hospital progress.   Code Status: Full code.  DVT Prophylaxis: Heparin.  Family Communication: I discussed the plan with the patient at the bedside.   Disposition Plan: Home when medically stable.   Time  spent: 60 minutes.  Wilson SingerGOSRANI,NIMISH C [facility] Pager [phone].  **Disclaimer: This note may have been dictated with voice recognition software. Similar sounding words can inadvertently be transcribed and this note may contain transcription errors which may not have been corrected upon publication of note.**

## 2013-10-09 NOTE — ED Notes (Signed)
Pt has had no seizure activity while in the ED. Pt aware the plan is to admit him to the hospital

## 2013-10-10 ENCOUNTER — Encounter (HOSPITAL_COMMUNITY): Payer: Self-pay | Admitting: *Deleted

## 2013-10-10 ENCOUNTER — Inpatient Hospital Stay (HOSPITAL_COMMUNITY)
Admit: 2013-10-10 | Discharge: 2013-10-10 | Disposition: A | Discharge status: Home / Self Care | Payer: Medicaid Other | Attending: Neurology | Admitting: Neurology

## 2013-10-10 ENCOUNTER — Inpatient Hospital Stay (HOSPITAL_COMMUNITY): Payer: Medicaid Other

## 2013-10-10 DIAGNOSIS — Z8673 Personal history of transient ischemic attack (TIA), and cerebral infarction without residual deficits: Secondary | ICD-10-CM

## 2013-10-10 LAB — LIPID PANEL
CHOLESTEROL: 179 mg/dL (ref 0–200)
HDL: 53 mg/dL (ref >39)
LDL Cholesterol: 100 mg/dL — ABNORMAL HIGH (ref 0–99)
TRIGLYCERIDES: 132 mg/dL (ref <150)
Total CHOL/HDL Ratio: 3.4 RATIO
VLDL: 26 mg/dL (ref 0–40)

## 2013-10-10 LAB — COMPREHENSIVE METABOLIC PANEL
ALT: 19 U/L (ref 0–53)
AST: 20 U/L (ref 0–37)
Albumin: 3.7 g/dL (ref 3.5–5.2)
Alkaline Phosphatase: 66 U/L (ref 39–117)
Anion gap: 10 (ref 5–15)
BILIRUBIN TOTAL: 0.5 mg/dL (ref 0.3–1.2)
BUN: 21 mg/dL (ref 6–23)
CHLORIDE: 104 meq/L (ref 96–112)
CO2: 26 mEq/L (ref 19–32)
Calcium: 9 mg/dL (ref 8.4–10.5)
Creatinine, Ser: 1.6 mg/dL — ABNORMAL HIGH (ref 0.50–1.35)
GFR calc Af Amer: 54 mL/min — ABNORMAL LOW (ref >90)
GFR calc non Af Amer: 47 mL/min — ABNORMAL LOW (ref >90)
GLUCOSE: 90 mg/dL (ref 70–99)
POTASSIUM: 4.2 meq/L (ref 3.7–5.3)
SODIUM: 140 meq/L (ref 137–147)
TOTAL PROTEIN: 7.1 g/dL (ref 6.0–8.3)

## 2013-10-10 LAB — PROTIME-INR
INR: 1.07 (ref 0.00–1.49)
Prothrombin Time: 13.9 seconds (ref 11.6–15.2)

## 2013-10-10 LAB — CBC
HEMATOCRIT: 39.3 % (ref 39.0–52.0)
HEMOGLOBIN: 13.5 g/dL (ref 13.0–17.0)
MCH: 33.1 pg (ref 26.0–34.0)
MCHC: 34.4 g/dL (ref 30.0–36.0)
MCV: 96.3 fL (ref 78.0–100.0)
Platelets: 180 10*3/uL (ref 150–400)
RBC: 4.08 MIL/uL — ABNORMAL LOW (ref 4.22–5.81)
RDW: 12.3 % (ref 11.5–15.5)
WBC: 6.8 10*3/uL (ref 4.0–10.5)

## 2013-10-10 MED ORDER — NICOTINE 21 MG/24HR TD PT24
21.0000 mg | MEDICATED_PATCH | Freq: Every day | TRANSDERMAL | Status: DC
  Administered 2013-10-10 – 2013-10-12 (×3): 21 mg via TRANSDERMAL
  Filled 2013-10-10 (×3): qty 1

## 2013-10-10 MED ORDER — HYDROCODONE-ACETAMINOPHEN 5-325 MG PO TABS
1.0000 | ORAL_TABLET | Freq: Four times a day (QID) | ORAL | Status: DC | PRN
  Administered 2013-10-10 – 2013-10-13 (×8): 1 via ORAL
  Filled 2013-10-10 (×9): qty 1

## 2013-10-10 MED ORDER — CLONIDINE HCL 0.2 MG PO TABS
0.3000 mg | ORAL_TABLET | Freq: Two times a day (BID) | ORAL | Status: DC | PRN
  Administered 2013-10-11: 0.3 mg via ORAL
  Filled 2013-10-10 (×2): qty 1

## 2013-10-10 NOTE — ED Notes (Signed)
Dr Lama at bedside,  

## 2013-10-10 NOTE — ED Notes (Signed)
Pt co headache, tylenol given per prn,

## 2013-10-10 NOTE — ED Notes (Signed)
Pt does not seem to understand why he is in hospital when asked, pt on monitor NSR, update given,

## 2013-10-10 NOTE — ED Notes (Signed)
Faxed release of medical records to [facility],

## 2013-10-10 NOTE — Progress Notes (Addendum)
Patient left unit involuntary. Patient has IV cath still in. Security called, MD called and [place] PD being called. Bed alarm was on continuous and patient kept turning the bed alarm. Educated the patient and family about the importance of the bed alarm. Family was at the bedside when I educated the patient about staying in the room and not touching his IV catheter. Spoke with son about how important it is for his father to return to the hospital for further evaluation. Son states it ok for his father to be committed for his safety.

## 2013-10-10 NOTE — Care Management (Signed)
InterQual Version:  InterQual 2014  Review date:  10-10-2013  Review Status:  In Primary  Product:  NHR:VACQP Adult  Criteria subset:  General Medical  Criteria status:  Criteria Not Met  (Symptom or finding within 24h)  (Excludes PO medications unless noted)   Select Day, One:     Episode Day 1, One:       ACUTE, >= One:         Neurological, >= One: [See note 1]           Neurological deficit, new onset (excludes TIA or stroke),           Both: [See note 2]             Intervention, Both:               CNS imaging study, scheduled or performed within 24h               Neurological assessment at least 6x/24h    INTERQUAL REVIEW NOTES:  Note 1[Neurological, >= One:]: Per PA second level review, IP  appropriate.  Note 2[Neurological deficit, new onset (excludes TIA or stroke),  Both:]: increased memory issues since DC from [facility] 7/17 where he  had been admitted for seizures. LOS there was 3 days.

## 2013-10-10 NOTE — Consult Note (Signed)
[facility] A. Merlene Laughter, MD     [email or web address]          Robert Walls is an 58 y.o. male.   ASSESSMENT/PLAN: 1. Acute encephalopathy likely due to postictal lethargy. His second etiology of the patient's seizures is not clear at this time. However, she does seem to have right frontal encephalomalacia consistent with an old infarct which could be the substrate of the patient's seizures. Additionally, there is some indication that he may have all call withdrawal seizures. In any situation, I agree with the patient being placed on antiseizure medications. An EEG will be obtained. Seizure precaution is advised. The patient should not operate machinery or drive vehicles. The patient is on Ultram outpatient which can be associated with seizures. It may be a good idea to discontinue this medication.  2. 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 The patient therefore should have a TEE and an event monitor to evaluate for current embolic sources. In the meantime, on antiplatelet agents are recommended.   3. Alcoholism.  4. Hypertension.  This is a 58 year old white male who has had 2 seizures 2 days ago. The patient was seen at [facility]. Because of a history of alcoholism, the diagnosis was thought to be due to a cold which all seizures. The patient tells me that he has had seizures a few years ago associated with abrupt withdrawal from benzodiazepines. The patient can't provided history but there is significant lapses in his history due to have a memory impairment status post recent seizures. The patient does not report a history of head trauma or strokes. He does complain of having headaches in the frontal and occipital regions. He denies any chest pain at this time. There is no history of shortness of breath. He tells me that he is had heart attack in the past which was diagnosed on  routine EKG. He denies any palpitations at this time but may of had this in the past. There is no GI, GU symptoms or other focal neurological symptoms. Review of systems otherwise negative.  GENERAL: He is in no acute distress.  HEENT: Supple. Atraumatic normocephalic. Poor dentition.  ABDOMEN: soft  EXTREMITIES: No edema   BACK: Normal.  SKIN: Normal by inspection.    MENTAL STATUS: He is awake and alert. He is oriented to person, place but not to time. He thinks his 22. He does follow commands well.  CRANIAL NERVES: Pupils are equal, round and reactive to light and accommodation; extra ocular movements are full, there is no significant nystagmus; visual fields are full; upper and lower facial muscles are normal in strength and symmetric, there is no flattening of the nasolabial folds; tongue is midline; uvula is midline; shoulder elevation is normal.  MOTOR: Normal tone, bulk and strength; no pronator drift.  COORDINATION: Left finger to nose is normal, right finger to nose is normal, No rest tremor; no intention tremor; no postural tremor; no bradykinesia.   REFLEXES: Deep tendon reflexes are symmetrical and normal. Babinski reflexes are flexor bilaterally.   SENSATION: Normal to light touch.   The brain MRI is reviewed in person. There is increased signal seen on diffusion imaging consistent with acute infarct. There is concurrent reduced signal seen on the ADC scan. These areas involve the left superior cerebellum, left thalamic region and left insular cortex. There is a small to moderate size encephalomalacia involving the right anterior frontal region.    Past  Medical History  Diagnosis Date  . Impaired hearing   . Hypertension     takes Tenoretic daily and Clonidine nightly  . Seizures     only 1 seizure 64yr ago;takes Dilantin tid  . Concussion 1993  . Arthritis   . Back pain   . Joint pain   . Joint swelling   . Urinary frequency   . Nocturia   . Anxiety      takes Valium prn    Past Surgical History  Procedure Laterality Date  . Inner ear surgery      as a child  . Back surgery  1993  . Finger surgery  2003    left ring finger    Family History  Problem Relation Age of Onset  . Anesthesia problems Neg Hx   . Hypotension Neg Hx   . Malignant hyperthermia Neg Hx   . Pseudochol deficiency Neg Hx     Social History:  reports that he has been smoking Cigarettes.  He has a 4 pack-year smoking history. He does not have any smokeless tobacco history on file. He reports that he drinks alcohol. He reports that he uses illicit drugs (Marijuana).  Allergies:  Allergies  Allergen Reactions  . Lisinopril Other (See Comments)    Depleted sodium; resulted in hospitalization, with the HCTZ     Medications: Prior to Admission medications   Medication Sig Start Date End Date Taking? Authorizing Provider  Aspirin-Acetaminophen-Caffeine (GOODY HEADACHE PO) Take 1 packet by mouth 2 (two) times daily as needed (headaches/ backache).   Yes Historical Provider, MD  cloNIDine (CATAPRES) 0.3 MG tablet Take 0.3 mg by mouth 2 (two) times daily.   Yes Historical Provider, MD  traMADol (ULTRAM) 50 MG tablet Take 50 mg by mouth once.   Yes Historical Provider, MD    Scheduled Meds: . aspirin  325 mg Oral Daily  . cloNIDine  0.3 mg Oral BID  . heparin  5,000 Units Subcutaneous 3 times per day  . levETIRAcetam  500 mg Intravenous Q12H   Continuous Infusions: . sodium chloride 75 mL/hr at 10/10/13 0924   PRN Meds:.acetaminophen, LORazepam, ondansetron (ZOFRAN) IV, ondansetron, traZODone   Blood pressure 174/98, pulse 67, temperature 97.6 F (36.4 C), temperature source Oral, resp. rate 19, height 5' 7" (1.702 m), weight 70.308 kg (155 lb), SpO2 97.00%.   Results for orders placed during the hospital encounter of 10/09/13 (from the past 48 hour(s))  URINALYSIS, ROUTINE W REFLEX MICROSCOPIC     Status: Abnormal   Collection Time    10/09/13  3:02 PM        Result Value Ref Range   Color, Urine YELLOW  YELLOW   APPearance HAZY (*) CLEAR   Specific Gravity, Urine 1.010  1.005 - 1.030   pH 6.0  5.0 - 8.0   Glucose, UA NEGATIVE  NEGATIVE mg/dL   Hgb urine dipstick MODERATE (*) NEGATIVE   Bilirubin Urine NEGATIVE  NEGATIVE   Ketones, ur NEGATIVE  NEGATIVE mg/dL   Protein, ur NEGATIVE  NEGATIVE mg/dL   Urobilinogen, UA 0.2  0.0 - 1.0 mg/dL   Nitrite NEGATIVE  NEGATIVE   Leukocytes, UA NEGATIVE  NEGATIVE  URINE RAPID DRUG SCREEN (HOSP PERFORMED)     Status: Abnormal   Collection Time    10/09/13  3:02 PM      Result Value Ref Range   Opiates NONE DETECTED  NONE DETECTED   Cocaine NONE DETECTED  NONE DETECTED   Benzodiazepines POSITIVE (*)  NONE DETECTED   Amphetamines NONE DETECTED  NONE DETECTED   Tetrahydrocannabinol POSITIVE (*) NONE DETECTED   Barbiturates NONE DETECTED  NONE DETECTED   Comment:            DRUG SCREEN FOR MEDICAL PURPOSES     ONLY.  IF CONFIRMATION IS NEEDED     FOR ANY PURPOSE, NOTIFY LAB     WITHIN 5 DAYS.                LOWEST DETECTABLE LIMITS     FOR URINE DRUG SCREEN     Drug Class       Cutoff (ng/mL)     Amphetamine      1000     Barbiturate      200     Benzodiazepine   235     Tricyclics       361     Opiates          300     Cocaine          300     THC              50  URINE MICROSCOPIC-ADD ON     Status: Abnormal   Collection Time    10/09/13  3:02 PM      Result Value Ref Range   Squamous Epithelial / LPF FEW (*) RARE   WBC, UA 3-6  <3 WBC/hpf   RBC / HPF 3-6  <3 RBC/hpf   Bacteria, UA FEW (*) RARE   Casts GRANULAR CAST (*) NEGATIVE  CBC WITH DIFFERENTIAL     Status: Abnormal   Collection Time    10/09/13  3:16 PM      Result Value Ref Range   WBC 7.8  4.0 - 10.5 K/uL   RBC 4.21 (*) 4.22 - 5.81 MIL/uL   Hemoglobin 13.9  13.0 - 17.0 g/dL   HCT 40.4  39.0 - 52.0 %   MCV 96.0  78.0 - 100.0 fL   MCH 33.0  26.0 - 34.0 pg   MCHC 34.4  30.0 - 36.0 g/dL   RDW 12.3  11.5 - 15.5 %    Platelets 197  150 - 400 K/uL   Neutrophils Relative % 60  43 - 77 %   Neutro Abs 4.6  1.7 - 7.7 K/uL   Lymphocytes Relative 29  12 - 46 %   Lymphs Abs 2.3  0.7 - 4.0 K/uL   Monocytes Relative 8  3 - 12 %   Monocytes Absolute 0.6  0.1 - 1.0 K/uL   Eosinophils Relative 3  0 - 5 %   Eosinophils Absolute 0.2  0.0 - 0.7 K/uL   Basophils Relative 0  0 - 1 %   Basophils Absolute 0.0  0.0 - 0.1 K/uL  COMPREHENSIVE METABOLIC PANEL     Status: Abnormal   Collection Time    10/09/13  3:16 PM      Result Value Ref Range   Sodium 137  137 - 147 mEq/L   Potassium 4.3  3.7 - 5.3 mEq/L   Chloride 99  96 - 112 mEq/L   CO2 27  19 - 32 mEq/L   Glucose, Bld 103 (*) 70 - 99 mg/dL   BUN 25 (*) 6 - 23 mg/dL   Creatinine, Ser 1.72 (*) 0.50 - 1.35 mg/dL   Calcium 8.9  8.4 - 10.5 mg/dL   Total Protein 7.4  6.0 - 8.3 g/dL  Albumin 3.8  3.5 - 5.2 g/dL   AST 21  0 - 37 U/L   ALT 20  0 - 53 U/L   Alkaline Phosphatase 69  39 - 117 U/L   Total Bilirubin 0.5  0.3 - 1.2 mg/dL   GFR calc non Af Amer 43 (*) >90 mL/min   GFR calc Af Amer 49 (*) >90 mL/min   Comment: (NOTE)     The eGFR has been calculated using the CKD EPI equation.     This calculation has not been validated in all clinical situations.     eGFR's persistently <90 mL/min signify possible Chronic Kidney     Disease.   Anion gap 11  5 - 15  TROPONIN I     Status: None   Collection Time    10/09/13  3:16 PM      Result Value Ref Range   Troponin I <0.30  <0.30 ng/mL   Comment:            Due to the release kinetics of cTnI,     a negative result within the first hours     of the onset of symptoms does not rule out     myocardial infarction with certainty.     If myocardial infarction is still suspected,     repeat the test at appropriate intervals.  COMPREHENSIVE METABOLIC PANEL     Status: Abnormal   Collection Time    10/10/13  5:41 AM      Result Value Ref Range   Sodium 140  137 - 147 mEq/L   Potassium 4.2  3.7 - 5.3 mEq/L    Chloride 104  96 - 112 mEq/L   CO2 26  19 - 32 mEq/L   Glucose, Bld 90  70 - 99 mg/dL   BUN 21  6 - 23 mg/dL   Creatinine, Ser 1.60 (*) 0.50 - 1.35 mg/dL   Calcium 9.0  8.4 - 10.5 mg/dL   Total Protein 7.1  6.0 - 8.3 g/dL   Albumin 3.7  3.5 - 5.2 g/dL   AST 20  0 - 37 U/L   ALT 19  0 - 53 U/L   Alkaline Phosphatase 66  39 - 117 U/L   Total Bilirubin 0.5  0.3 - 1.2 mg/dL   GFR calc non Af Amer 47 (*) >90 mL/min   GFR calc Af Amer 54 (*) >90 mL/min   Comment: (NOTE)     The eGFR has been calculated using the CKD EPI equation.     This calculation has not been validated in all clinical situations.     eGFR's persistently <90 mL/min signify possible Chronic Kidney     Disease.   Anion gap 10  5 - 15  CBC     Status: Abnormal   Collection Time    10/10/13  5:41 AM      Result Value Ref Range   WBC 6.8  4.0 - 10.5 K/uL   RBC 4.08 (*) 4.22 - 5.81 MIL/uL   Hemoglobin 13.5  13.0 - 17.0 g/dL   HCT 39.3  39.0 - 52.0 %   MCV 96.3  78.0 - 100.0 fL   MCH 33.1  26.0 - 34.0 pg   MCHC 34.4  30.0 - 36.0 g/dL   RDW 12.3  11.5 - 15.5 %   Platelets 180  150 - 400 K/uL  PROTIME-INR     Status: None   Collection Time    10/10/13  5:41 AM      Result Value Ref Range   Prothrombin Time 13.9  11.6 - 15.2 seconds   INR 1.07  0.00 - 1.49  LIPID PANEL     Status: Abnormal   Collection Time    10/10/13  5:41 AM      Result Value Ref Range   Cholesterol 179  0 - 200 mg/dL   Triglycerides 132  <150 mg/dL   HDL 53  >39 mg/dL   Total CHOL/HDL Ratio 3.4     VLDL 26  0 - 40 mg/dL   LDL Cholesterol 100 (*) 0 - 99 mg/dL   Comment:            Total Cholesterol/HDL:CHD Risk     Coronary Heart Disease Risk Table                         Men   Women      1/2 Average Risk   3.4   3.3      Average Risk       5.0   4.4      2 X Average Risk   9.6   7.1      3 X Average Risk  23.4   11.0                Use the calculated Patient Ratio     above and the CHD Risk Table     to determine the patient's CHD  Risk.                ATP III CLASSIFICATION (LDL):      <100     mg/dL   Optimal      100-129  mg/dL   Near or Above                        Optimal      130-159  mg/dL   Borderline      160-189  mg/dL   High      >190     mg/dL   Very High    Ct Head Wo Contrast  10/09/2013   CLINICAL DATA:  Seizures. Memory loss. Hit head during a seizure with bleeding from right ear. Neck pain, headache, and lightheadedness.  EXAM: CT HEAD WITHOUT CONTRAST  CT TEMPORAL BONES WITHOUT CONTRAST  CT CERVICAL SPINE WITHOUT CONTRAST  TECHNIQUE: Multidetector CT imaging of the head, temporal bones, and cervical spine were performed using the standard protocol without intravenous contrast. Multiplanar CT image reconstructions of the cervical spine and temporal bones were also generated.  COMPARISON:  None.  FINDINGS: CT HEAD FINDINGS  There is no evidence of acute cortical infarct, intracranial hemorrhage, mass, midline shift, or extra-axial fluid collection. There is mild cerebral atrophy. Patchy periventricular white matter hypoattenuation is nonspecific but is compatible with minimal chronic small vessel ischemic disease. The visualized orbits are unremarkable. Visualized paranasal sinuses are clear. There are bilateral mastoid effusions.  CT TEMPORAL BONES FINDINGS  RIGHT: A small amount of debris is present in the external auditory canal. Superior portion of the tympanic membrane is not well seen. There is a moderate amount of fluid/ soft tissue throughout the tympanic cavity. The malleus and incus appear intact. The stapes is partially obscured by fluid/soft tissue. The internal auditory canal, cochlea, vestibule, and semicircular canals are unremarkable. The mastoid air cells are completely opacified. There are areas of osseous thinning  involving the tegmen mastoideum, and dehiscence cannot be excluded.  LEFT: Focal soft tissue thickening/ cerumen is noted along the superior aspect of the external auditory canal.  Tympanic membrane does not appear thickened, with portions of it are not well seen. There is a small amount of fluid/soft tissue in the tympanic cavity, predominantly in the epitympanum. The ossicles appear intact. The ossicles appear intact. The tympanic cavity is clear. The internal auditory canal, cochlea, vestibule, and semicircular canals are unremarkable. There is complete opacification of the mastoid air cells. There are areas of osseous thinning involving the tegmen mastoideum, and dehiscence cannot be excluded.  CT CERVICAL SPINE FINDINGS  Vertebral alignment is normal. Vertebral body heights are preserved without compression fracture. Prevertebral soft tissues are within normal limits. Moderate anterior osteophyte formation is present at C5-6 and C6-7. Mild disc space narrowing is noted at these two levels, with small amount of vacuum disc phenomenon present at C5-6. Moderate osseous overgrowth is noted at the atlantodental articulation. No acute cervical spine fracture is identified. Mild to moderate facet arthrosis is noted in the mid to upper cervical spine. Mild biapical pleural thickening is partially visualized.  IMPRESSION: 1. No evidence of acute intracranial abnormality. 2. Complete opacification of the mastoid air cells bilaterally with partial opacification of the right greater than left middle year. No temporal bone fracture is identified, and findings may reflect otomastoiditis. 3. No acute osseous abnormality identified in the cervical spine.   Electronically Signed   By: Logan Bores   On: 10/09/2013 16:26   Ct Cervical Spine Wo Contrast  10/09/2013   CLINICAL DATA:  Seizures. Memory loss. Hit head during a seizure with bleeding from right ear. Neck pain, headache, and lightheadedness.  EXAM: CT HEAD WITHOUT CONTRAST  CT TEMPORAL BONES WITHOUT CONTRAST  CT CERVICAL SPINE WITHOUT CONTRAST  TECHNIQUE: Multidetector CT imaging of the head, temporal bones, and cervical spine were performed  using the standard protocol without intravenous contrast. Multiplanar CT image reconstructions of the cervical spine and temporal bones were also generated.  COMPARISON:  None.  FINDINGS: CT HEAD FINDINGS  There is no evidence of acute cortical infarct, intracranial hemorrhage, mass, midline shift, or extra-axial fluid collection. There is mild cerebral atrophy. Patchy periventricular white matter hypoattenuation is nonspecific but is compatible with minimal chronic small vessel ischemic disease. The visualized orbits are unremarkable. Visualized paranasal sinuses are clear. There are bilateral mastoid effusions.  CT TEMPORAL BONES FINDINGS  RIGHT: A small amount of debris is present in the external auditory canal. Superior portion of the tympanic membrane is not well seen. There is a moderate amount of fluid/ soft tissue throughout the tympanic cavity. The malleus and incus appear intact. The stapes is partially obscured by fluid/soft tissue. The internal auditory canal, cochlea, vestibule, and semicircular canals are unremarkable. The mastoid air cells are completely opacified. There are areas of osseous thinning involving the tegmen mastoideum, and dehiscence cannot be excluded.  LEFT: Focal soft tissue thickening/ cerumen is noted along the superior aspect of the external auditory canal. Tympanic membrane does not appear thickened, with portions of it are not well seen. There is a small amount of fluid/soft tissue in the tympanic cavity, predominantly in the epitympanum. The ossicles appear intact. The ossicles appear intact. The tympanic cavity is clear. The internal auditory canal, cochlea, vestibule, and semicircular canals are unremarkable. There is complete opacification of the mastoid air cells. There are areas of osseous thinning involving the tegmen mastoideum, and dehiscence cannot be excluded.  CT CERVICAL SPINE FINDINGS  Vertebral alignment is normal. Vertebral body heights are preserved without  compression fracture. Prevertebral soft tissues are within normal limits. Moderate anterior osteophyte formation is present at C5-6 and C6-7. Mild disc space narrowing is noted at these two levels, with small amount of vacuum disc phenomenon present at C5-6. Moderate osseous overgrowth is noted at the atlantodental articulation. No acute cervical spine fracture is identified. Mild to moderate facet arthrosis is noted in the mid to upper cervical spine. Mild biapical pleural thickening is partially visualized.  IMPRESSION: 1. No evidence of acute intracranial abnormality. 2. Complete opacification of the mastoid air cells bilaterally with partial opacification of the right greater than left middle year. No temporal bone fracture is identified, and findings may reflect otomastoiditis. 3. No acute osseous abnormality identified in the cervical spine.   Electronically Signed   By: Logan Bores   On: 10/09/2013 16:26   Mr Brain Wo Contrast  10/09/2013   CLINICAL DATA:  58 year old male with recent seizure activity. Headache and memory loss. Initial encounter.  EXAM: MRI HEAD WITHOUT CONTRAST  TECHNIQUE: Multiplanar, multiecho pulse sequences of the brain and surrounding structures were obtained without intravenous contrast.  COMPARISON:  Head, cervical spine and temporal bone CT without contrast 10/09/2013.  FINDINGS: Small curvilinear focus of restricted diffusion in the dorsal left thalamus very near but not definitely affecting the tail of the left hippocampus (series 100, image 17 and series 101, image 18). No hippocampal complex restricted diffusion. Small left of midline linear superior cerebellar artery region focus of restricted diffusion (series 100, image 12 and series 101, image 16).  Furthermore there is a subtle area of left insula diffusion restriction suspected on series 100, image 18.  No right hemisphere diffusion restriction. Major intracranial vascular flow voids are preserved, with some  intracranial artery dolichoectasia.  Bilateral mastoid effusions, with facilitated diffusion. Mild paranasal sinus inflammatory changes. Negative visualized nasopharynx. Visualized orbit soft tissues are within normal limits.  Chronic T2 and FLAIR hyperintensity in the right cerebellar ganglia (series 7, image 6). Scattered nonspecific cerebral white matter T2 and FLAIR hyperintensity, most pronounced in the anterior frontal lobes and greater on the left. There is also fairly extensive chronic encephalomalacia of the inferior right temporal lobe at the fusiform gyrus (series 10, image 25). T2 signal in the hippocampal complexes is within normal limits and symmetric.  No acute or chronic intracranial hemorrhage identified. No midline shift, mass effect, or evidence of intracranial mass lesion. No ventriculomegaly. Negative pituitary. Negative cervicomedullary junction and visualized cervical spine.  Normal bone marrow signal. Visualized scalp soft tissues are within normal limits. Tortuous cervical carotid arteries.  IMPRESSION: 1. 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 These are compatible with recent left posterior fossa small vessel ischemia. No associated mass effect or hemorrhage. 2. Superimposed small acute to subacute left insula infarct. This is in the left MCA territory, and could reflect synchronous small vessel disease or indicate recent Mild embolic event. 3. Nonspecific right temporal lobe encephalomalacia. Scattered nonspecific cerebral and cerebellar white matter signal abnormality. 4. Intra and extracranial artery dolichoectasia. 5. Bilateral mastoid effusions, appear to be simple/sterile.   Electronically Signed   By: Lars Pinks M.D.   On: 10/09/2013 18:52   US Carotid Bilateral  10/10/2013   CLINICAL DATA:  Stroke  EXAM: BILATERAL CAROTID DUPLEX ULTRASOUND  TECHNIQUE: Pearline Cables scale imaging, color  Doppler and duplex ultrasound  were performed of bilateral carotid and vertebral arteries in the neck.  COMPARISON:  None.  FINDINGS: Criteria: Quantification of carotid stenosis is based on velocity parameters that correlate the residual internal carotid diameter with NASCET-based stenosis levels, using the diameter of the distal internal carotid lumen as the denominator for stenosis measurement.  The following velocity measurements were obtained:  RIGHT  ICA:  74 cm/sec  CCA:  74 cm/sec  SYSTOLIC ICA/CCA RATIO:  1.0  DIASTOLIC ICA/CCA RATIO:  ECA:  78 cm/sec  LEFT  ICA:  70 cm/sec  CCA:  54 cm/sec  SYSTOLIC ICA/CCA RATIO:  1.3  DIASTOLIC ICA/CCA RATIO:  ECA:  80 cm/sec  RIGHT CAROTID ARTERY: Mild calcified plaque in the lower internal carotid. Minimal intimal thickening in the bulb. Low resistance internal carotid Doppler pattern.  RIGHT VERTEBRAL ARTERY:  Antegrade.  Low resistance Doppler pattern.  LEFT CAROTID ARTERY: Minimal scattered calcified plaque in the bulb. Low resistance internal carotid Doppler pattern. Mild calcified plaque in the lower internal carotid.  LEFT VERTEBRAL ARTERY:  Antegrade.  Normal Doppler pattern.  IMPRESSION: Less than 50% stenosis in the right and left internal carotid arteries.   Electronically Signed   By: Maryclare Bean M.D.   On: 10/10/2013 09:28   Ct Temporal Bones W/o Cm  10/09/2013   CLINICAL DATA:  Seizures. Memory loss. Hit head during a seizure with bleeding from right ear. Neck pain, headache, and lightheadedness.  EXAM: CT HEAD WITHOUT CONTRAST  CT TEMPORAL BONES WITHOUT CONTRAST  CT CERVICAL SPINE WITHOUT CONTRAST  TECHNIQUE: Multidetector CT imaging of the head, temporal bones, and cervical spine were performed using the standard protocol without intravenous contrast. Multiplanar CT image reconstructions of the cervical spine and temporal bones were also generated.  COMPARISON:  None.  FINDINGS: CT HEAD FINDINGS  There is no evidence of acute cortical infarct,  intracranial hemorrhage, mass, midline shift, or extra-axial fluid collection. There is mild cerebral atrophy. Patchy periventricular white matter hypoattenuation is nonspecific but is compatible with minimal chronic small vessel ischemic disease. The visualized orbits are unremarkable. Visualized paranasal sinuses are clear. There are bilateral mastoid effusions.  CT TEMPORAL BONES FINDINGS  RIGHT: A small amount of debris is present in the external auditory canal. Superior portion of the tympanic membrane is not well seen. There is a moderate amount of fluid/ soft tissue throughout the tympanic cavity. The malleus and incus appear intact. The stapes is partially obscured by fluid/soft tissue. The internal auditory canal, cochlea, vestibule, and semicircular canals are unremarkable. The mastoid air cells are completely opacified. There are areas of osseous thinning involving the tegmen mastoideum, and dehiscence cannot be excluded.  LEFT: Focal soft tissue thickening/ cerumen is noted along the superior aspect of the external auditory canal. Tympanic membrane does not appear thickened, with portions of it are not well seen. There is a small amount of fluid/soft tissue in the tympanic cavity, predominantly in the epitympanum. The ossicles appear intact. The ossicles appear intact. The tympanic cavity is clear. The internal auditory canal, cochlea, vestibule, and semicircular canals are unremarkable. There is complete opacification of the mastoid air cells. There are areas of osseous thinning involving the tegmen mastoideum, and dehiscence cannot be excluded.  CT CERVICAL SPINE FINDINGS  Vertebral alignment is normal. Vertebral body heights are preserved without compression fracture. Prevertebral soft tissues are within normal limits. Moderate anterior osteophyte formation is present at C5-6 and C6-7. Mild disc space narrowing is noted at these two levels, with small amount of vacuum  disc phenomenon present at C5-6.  Moderate osseous overgrowth is noted at the atlantodental articulation. No acute cervical spine fracture is identified. Mild to moderate facet arthrosis is noted in the mid to upper cervical spine. Mild biapical pleural thickening is partially visualized.  IMPRESSION: 1. No evidence of acute intracranial abnormality. 2. Complete opacification of the mastoid air cells bilaterally with partial opacification of the right greater than left middle year. No temporal bone fracture is identified, and findings may reflect otomastoiditis. 3. No acute osseous abnormality identified in the cervical spine.   Electronically Signed   By: Logan Bores   On: 10/09/2013 16:26        Doniesha Landau A. Merlene Laughter, M.D.  Diplomate, Tax adviser of Psychiatry and Neurology ( Neurology). 10/10/2013, 9:59 AM

## 2013-10-10 NOTE — ED Notes (Signed)
Pt had hooked himself from iv line and monitor, out to nursing desk, pt instructed that he would need utilize call bell to assistance. Pt expressed understanding,

## 2013-10-10 NOTE — Progress Notes (Signed)
EEG Completed; Results Pending  

## 2013-10-10 NOTE — ED Notes (Signed)
Dr Gerilyn Pilgrimoonquah at bedside,

## 2013-10-10 NOTE — ED Notes (Signed)
Found wandering in hall near nurses station, apparently has unplugged all monitoring and unscrewed his IV. Asked for a phone to call his son because he's "done now" and needs a ride home. Pt. Cooperative, polite. Does not remember conversations we had @ 10pm about his hospitalization. Knows he's in [facility]. Looking at the monitor and says, "my heart has never been that slow" I explained to him again that is why we need to monitor him. All leads and BP replaced. IV still patent.states his headache is almost gone, but then gives it a 6/10 rating.

## 2013-10-10 NOTE — Progress Notes (Addendum)
[facility] PROGRESS NOTE  Robert Walls WJX:[id number] DOB: 22-Aug-1955 DOA: 10/09/2013 PCP: No PCP Per Patient  Assessment/Plan: 1. CVA- patient came with altered mental status, MRI brain shows small subacute to acute infarct in the dorsal left thalamus, and superior Left cerebellum.  Superimposed small acute to subacute left insular infarct. Neurology has seen the patient, and recommend TEE to further ascertain the cause of possible embolic shower. Will get cardiology consultation and TEE. Carotid Dopplers did not show significant blockage of the carotid arteries, will cancel the transthoracic ultrasound. Continue aspirin 325 mg by mouth daily. 2. Seizures- patient has history of alcohol abuse, and there was a question of possible alcohol withdrawal seizures. Patient had seizures on 10/04/2013 at that time he was admitted to [facility]. Will continue with Keppra, EEG has been ordered. Neurology following 3. Hypertension- patient's blood pressure is elevated, will hold clonidine and change it to when necessary for blood pressure greater than 210/120, for permissive hypertension. 4. Acute kidney injury- creatinine came down from 1.72-1.60, continue IV fluids. Follow BMP in a.m. 5. Alcohol abuse- patient is not an alcohol.this time continue Ativan when necessary 6. DVT prophylaxis- heparin  Code Status: Full code Family Communication: *No family at bedside, discussed with patient Disposition Plan: Home when stable   Consultants:  Neurology   Procedures:  None  Antibiotics:  None  HPI/Subjective:  This is a 58 year old man who presents with altered mental status. According to the ER notes, family member had witnessed the patient had 2 seizures yesterday. The patient apparently has had seizures before and these were thought perhaps to do with alcohol withdrawal. The patient tells me that he drinks two 24 ounce cans of beer almost every night. He has not had any alcohol in the  last 3 nights. This morning he was confused and still appears to be confused by his own admission, but thinks he is getting better. He is now being admitted for further investigation and management.  He is alert, oriented to person and place, denies any new complaints.   Objective: Filed Vitals:   10/10/13 1358  BP: 176/86  Pulse: 55  Temp: 97.7 F (36.5 C)  Resp: 18    Intake/Output Summary (Last 24 hours) at 10/10/13 1424 Last data filed at 10/10/13 1358  Gross per 24 hour  Intake    960 ml  Output   1100 ml  Net   -140 ml   Filed Weights   10/09/13 1332 10/10/13 1102  Weight: 70.308 kg (155 lb) 70.4 kg (155 lb 3.3 oz)    Exam:  Physical Exam: Head: Normocephalic, atraumatic.  Lungs: Normal respiratory effort. B/L Clear to auscultation, no crackles or wheezes.  Heart: Regular RR. S1 and S2 normal  Abdomen: BS normoactive. Soft, Nondistended, non-tender.  Extremities: No pretibial edema, no erythema Neuro : AO x 2, not oriented to time, no focal defecits  Data Reviewed: Basic Metabolic Panel:  Recent Labs Lab 10/09/13 1516 10/10/13 0541  NA 137 140  K 4.3 4.2  CL 99 104  CO2 27 26  GLUCOSE 103* 90  BUN 25* 21  CREATININE 1.72* 1.60*  CALCIUM 8.9 9.0   Liver Function Tests:  Recent Labs Lab 10/09/13 1516 10/10/13 0541  AST 21 20  ALT 20 19  ALKPHOS 69 66  BILITOT 0.5 0.5  PROT 7.4 7.1  ALBUMIN 3.8 3.7   No results found for this basename: LIPASE, AMYLASE,  in the last 168 hours No results found for this basename:  AMMONIA,  in the last 168 hours CBC:  Recent Labs Lab 10/09/13 1516 10/10/13 0541  WBC 7.8 6.8  NEUTROABS 4.6  --   HGB 13.9 13.5  HCT 40.4 39.3  MCV 96.0 96.3  PLT 197 180   Cardiac Enzymes:  Recent Labs Lab 10/09/13 1516  TROPONINI <0.30     Studies: Ct Head Wo Contrast  10/09/2013   CLINICAL DATA:  Seizures. Memory loss. Hit head during a seizure with bleeding from right ear. Neck pain, headache, and  lightheadedness.  EXAM: CT HEAD WITHOUT CONTRAST  CT TEMPORAL BONES WITHOUT CONTRAST  CT CERVICAL SPINE WITHOUT CONTRAST  TECHNIQUE: Multidetector CT imaging of the head, temporal bones, and cervical spine were performed using the standard protocol without intravenous contrast. Multiplanar CT image reconstructions of the cervical spine and temporal bones were also generated.  COMPARISON:  None.  FINDINGS: CT HEAD FINDINGS  There is no evidence of acute cortical infarct, intracranial hemorrhage, mass, midline shift, or extra-axial fluid collection. There is mild cerebral atrophy. Patchy periventricular white matter hypoattenuation is nonspecific but is compatible with minimal chronic small vessel ischemic disease. The visualized orbits are unremarkable. Visualized paranasal sinuses are clear. There are bilateral mastoid effusions.  CT TEMPORAL BONES FINDINGS  RIGHT: A small amount of debris is present in the external auditory canal. Superior portion of the tympanic membrane is not well seen. There is a moderate amount of fluid/ soft tissue throughout the tympanic cavity. The malleus and incus appear intact. The stapes is partially obscured by fluid/soft tissue. The internal auditory canal, cochlea, vestibule, and semicircular canals are unremarkable. The mastoid air cells are completely opacified. There are areas of osseous thinning involving the tegmen mastoideum, and dehiscence cannot be excluded.  LEFT: Focal soft tissue thickening/ cerumen is noted along the superior aspect of the external auditory canal. Tympanic membrane does not appear thickened, with portions of it are not well seen. There is a small amount of fluid/soft tissue in the tympanic cavity, predominantly in the epitympanum. The ossicles appear intact. The ossicles appear intact. The tympanic cavity is clear. The internal auditory canal, cochlea, vestibule, and semicircular canals are unremarkable. There is complete opacification of the mastoid air  cells. There are areas of osseous thinning involving the tegmen mastoideum, and dehiscence cannot be excluded.  CT CERVICAL SPINE FINDINGS  Vertebral alignment is normal. Vertebral body heights are preserved without compression fracture. Prevertebral soft tissues are within normal limits. Moderate anterior osteophyte formation is present at C5-6 and C6-7. Mild disc space narrowing is noted at these two levels, with small amount of vacuum disc phenomenon present at C5-6. Moderate osseous overgrowth is noted at the atlantodental articulation. No acute cervical spine fracture is identified. Mild to moderate facet arthrosis is noted in the mid to upper cervical spine. Mild biapical pleural thickening is partially visualized.  IMPRESSION: 1. No evidence of acute intracranial abnormality. 2. Complete opacification of the mastoid air cells bilaterally with partial opacification of the right greater than left middle year. No temporal bone fracture is identified, and findings may reflect otomastoiditis. 3. No acute osseous abnormality identified in the cervical spine.   Electronically Signed   By: Sebastian Ache   On: 10/09/2013 16:26   Ct Cervical Spine Wo Contrast  10/09/2013   CLINICAL DATA:  Seizures. Memory loss. Hit head during a seizure with bleeding from right ear. Neck pain, headache, and lightheadedness.  EXAM: CT HEAD WITHOUT CONTRAST  CT TEMPORAL BONES WITHOUT CONTRAST  CT CERVICAL SPINE WITHOUT  CONTRAST  TECHNIQUE: Multidetector CT imaging of the head, temporal bones, and cervical spine were performed using the standard protocol without intravenous contrast. Multiplanar CT image reconstructions of the cervical spine and temporal bones were also generated.  COMPARISON:  None.  FINDINGS: CT HEAD FINDINGS  There is no evidence of acute cortical infarct, intracranial hemorrhage, mass, midline shift, or extra-axial fluid collection. There is mild cerebral atrophy. Patchy periventricular white matter hypoattenuation  is nonspecific but is compatible with minimal chronic small vessel ischemic disease. The visualized orbits are unremarkable. Visualized paranasal sinuses are clear. There are bilateral mastoid effusions.  CT TEMPORAL BONES FINDINGS  RIGHT: A small amount of debris is present in the external auditory canal. Superior portion of the tympanic membrane is not well seen. There is a moderate amount of fluid/ soft tissue throughout the tympanic cavity. The malleus and incus appear intact. The stapes is partially obscured by fluid/soft tissue. The internal auditory canal, cochlea, vestibule, and semicircular canals are unremarkable. The mastoid air cells are completely opacified. There are areas of osseous thinning involving the tegmen mastoideum, and dehiscence cannot be excluded.  LEFT: Focal soft tissue thickening/ cerumen is noted along the superior aspect of the external auditory canal. Tympanic membrane does not appear thickened, with portions of it are not well seen. There is a small amount of fluid/soft tissue in the tympanic cavity, predominantly in the epitympanum. The ossicles appear intact. The ossicles appear intact. The tympanic cavity is clear. The internal auditory canal, cochlea, vestibule, and semicircular canals are unremarkable. There is complete opacification of the mastoid air cells. There are areas of osseous thinning involving the tegmen mastoideum, and dehiscence cannot be excluded.  CT CERVICAL SPINE FINDINGS  Vertebral alignment is normal. Vertebral body heights are preserved without compression fracture. Prevertebral soft tissues are within normal limits. Moderate anterior osteophyte formation is present at C5-6 and C6-7. Mild disc space narrowing is noted at these two levels, with small amount of vacuum disc phenomenon present at C5-6. Moderate osseous overgrowth is noted at the atlantodental articulation. No acute cervical spine fracture is identified. Mild to moderate facet arthrosis is noted  in the mid to upper cervical spine. Mild biapical pleural thickening is partially visualized.  IMPRESSION: 1. No evidence of acute intracranial abnormality. 2. Complete opacification of the mastoid air cells bilaterally with partial opacification of the right greater than left middle year. No temporal bone fracture is identified, and findings may reflect otomastoiditis. 3. No acute osseous abnormality identified in the cervical spine.   Electronically Signed   By: Sebastian Ache   On: 10/09/2013 16:26   Mr Brain Wo Contrast  10/09/2013   CLINICAL DATA:  58 year old male with recent seizure activity. Headache and memory loss. Initial encounter.  EXAM: MRI HEAD WITHOUT CONTRAST  TECHNIQUE: Multiplanar, multiecho pulse sequences of the brain and surrounding structures were obtained without intravenous contrast.  COMPARISON:  Head, cervical spine and temporal bone CT without contrast 10/09/2013.  FINDINGS: Small curvilinear focus of restricted diffusion in the dorsal left thalamus very near but not definitely affecting the tail of the left hippocampus (series 100, image 17 and series 101, image 18). No hippocampal complex restricted diffusion. Small left of midline linear superior cerebellar artery region focus of restricted diffusion (series 100, image 12 and series 101, image 16).  Furthermore there is a subtle area of left insula diffusion restriction suspected on series 100, image 18.  No right hemisphere diffusion restriction. Major intracranial vascular flow voids are preserved, with some  intracranial artery dolichoectasia.  Bilateral mastoid effusions, with facilitated diffusion. Mild paranasal sinus inflammatory changes. Negative visualized nasopharynx. Visualized orbit soft tissues are within normal limits.  Chronic T2 and FLAIR hyperintensity in the right cerebellar ganglia (series 7, image 6). Scattered nonspecific cerebral white matter T2 and FLAIR hyperintensity, most pronounced in the anterior frontal  lobes and greater on the left. There is also fairly extensive chronic encephalomalacia of the inferior right temporal lobe at the fusiform gyrus (series 10, image 25). T2 signal in the hippocampal complexes is within normal limits and symmetric.  No acute or chronic intracranial hemorrhage identified. No midline shift, mass effect, or evidence of intracranial mass lesion. No ventriculomegaly. Negative pituitary. Negative cervicomedullary junction and visualized cervical spine.  Normal bone marrow signal. Visualized scalp soft tissues are within normal limits. Tortuous cervical carotid arteries.  IMPRESSION: 1. 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 These are compatible with recent left posterior fossa small vessel ischemia. No associated mass effect or hemorrhage. 2. Superimposed small acute to subacute left insula infarct. This is in the left MCA territory, and could reflect synchronous small vessel disease or indicate recent Mild embolic event. 3. Nonspecific right temporal lobe encephalomalacia. Scattered nonspecific cerebral and cerebellar white matter signal abnormality. 4. Intra and extracranial artery dolichoectasia. 5. Bilateral mastoid effusions, appear to be simple/sterile.   Electronically Signed   By: Augusto GambleLee  Hall M.D.   On: 10/09/2013 18:52   [place] Carotid Bilateral  10/10/2013   CLINICAL DATA:  Stroke  EXAM: BILATERAL CAROTID DUPLEX ULTRASOUND  TECHNIQUE: Wallace CullensGray scale imaging, color Doppler and duplex ultrasound were performed of bilateral carotid and vertebral arteries in the neck.  COMPARISON:  None.  FINDINGS: Criteria: Quantification of carotid stenosis is based on velocity parameters that correlate the residual internal carotid diameter with NASCET-based stenosis levels, using the diameter of the distal internal carotid lumen as the denominator for stenosis measurement.  The following velocity  measurements were obtained:  RIGHT  ICA:  74 cm/sec  CCA:  74 cm/sec  SYSTOLIC ICA/CCA RATIO:  1.0  DIASTOLIC ICA/CCA RATIO:  ECA:  78 cm/sec  LEFT  ICA:  70 cm/sec  CCA:  54 cm/sec  SYSTOLIC ICA/CCA RATIO:  1.3  DIASTOLIC ICA/CCA RATIO:  ECA:  80 cm/sec  RIGHT CAROTID ARTERY: Mild calcified plaque in the lower internal carotid. Minimal intimal thickening in the bulb. Low resistance internal carotid Doppler pattern.  RIGHT VERTEBRAL ARTERY:  Antegrade.  Low resistance Doppler pattern.  LEFT CAROTID ARTERY: Minimal scattered calcified plaque in the bulb. Low resistance internal carotid Doppler pattern. Mild calcified plaque in the lower internal carotid.  LEFT VERTEBRAL ARTERY:  Antegrade.  Normal Doppler pattern.  IMPRESSION: Less than 50% stenosis in the right and left internal carotid arteries.   Electronically Signed   By: Maryclare BeanArt  Hoss M.D.   On: 10/10/2013 09:28   Ct Temporal Bones W/o Cm  10/09/2013   CLINICAL DATA:  Seizures. Memory loss. Hit head during a seizure with bleeding from right ear. Neck pain, headache, and lightheadedness.  EXAM: CT HEAD WITHOUT CONTRAST  CT TEMPORAL BONES WITHOUT CONTRAST  CT CERVICAL SPINE WITHOUT CONTRAST  TECHNIQUE: Multidetector CT imaging of the head, temporal bones, and cervical spine were performed using the standard protocol without intravenous contrast. Multiplanar CT image reconstructions of the cervical spine and temporal bones were also generated.  COMPARISON:  None.  FINDINGS: CT HEAD FINDINGS  There is  no evidence of acute cortical infarct, intracranial hemorrhage, mass, midline shift, or extra-axial fluid collection. There is mild cerebral atrophy. Patchy periventricular white matter hypoattenuation is nonspecific but is compatible with minimal chronic small vessel ischemic disease. The visualized orbits are unremarkable. Visualized paranasal sinuses are clear. There are bilateral mastoid effusions.  CT TEMPORAL BONES FINDINGS  RIGHT: A small amount of debris is  present in the external auditory canal. Superior portion of the tympanic membrane is not well seen. There is a moderate amount of fluid/ soft tissue throughout the tympanic cavity. The malleus and incus appear intact. The stapes is partially obscured by fluid/soft tissue. The internal auditory canal, cochlea, vestibule, and semicircular canals are unremarkable. The mastoid air cells are completely opacified. There are areas of osseous thinning involving the tegmen mastoideum, and dehiscence cannot be excluded.  LEFT: Focal soft tissue thickening/ cerumen is noted along the superior aspect of the external auditory canal. Tympanic membrane does not appear thickened, with portions of it are not well seen. There is a small amount of fluid/soft tissue in the tympanic cavity, predominantly in the epitympanum. The ossicles appear intact. The ossicles appear intact. The tympanic cavity is clear. The internal auditory canal, cochlea, vestibule, and semicircular canals are unremarkable. There is complete opacification of the mastoid air cells. There are areas of osseous thinning involving the tegmen mastoideum, and dehiscence cannot be excluded.  CT CERVICAL SPINE FINDINGS  Vertebral alignment is normal. Vertebral body heights are preserved without compression fracture. Prevertebral soft tissues are within normal limits. Moderate anterior osteophyte formation is present at C5-6 and C6-7. Mild disc space narrowing is noted at these two levels, with small amount of vacuum disc phenomenon present at C5-6. Moderate osseous overgrowth is noted at the atlantodental articulation. No acute cervical spine fracture is identified. Mild to moderate facet arthrosis is noted in the mid to upper cervical spine. Mild biapical pleural thickening is partially visualized.  IMPRESSION: 1. No evidence of acute intracranial abnormality. 2. Complete opacification of the mastoid air cells bilaterally with partial opacification of the right greater  than left middle year. No temporal bone fracture is identified, and findings may reflect otomastoiditis. 3. No acute osseous abnormality identified in the cervical spine.   Electronically Signed   By: Sebastian Ache   On: 10/09/2013 16:26    Scheduled Meds: . aspirin  325 mg Oral Daily  . cloNIDine  0.3 mg Oral BID  . heparin  5,000 Units Subcutaneous 3 times per day  . levETIRAcetam  500 mg Intravenous Q12H   Continuous Infusions: . sodium chloride 75 mL/hr at 10/10/13 6045    Active Problems:   Altered mental status   Seizure   Alcohol abuse   HTN (hypertension)    Time spent: 25 min    [facility]  [facility] Pager [phone]*. If 7PM-7AM, please contact night-coverage at [email or web address], password [facility] 10/10/2013, 2:24 PM  LOS: 1 day

## 2013-10-11 DIAGNOSIS — I6340 Cerebral infarction due to embolism of unspecified cerebral artery: Secondary | ICD-10-CM

## 2013-10-11 MED ORDER — AMLODIPINE BESYLATE 5 MG PO TABS
10.0000 mg | ORAL_TABLET | Freq: Every day | ORAL | Status: DC
  Administered 2013-10-11 – 2013-10-12 (×2): 10 mg via ORAL
  Filled 2013-10-11 (×2): qty 2

## 2013-10-11 MED ORDER — HYDRALAZINE HCL 20 MG/ML IJ SOLN
5.0000 mg | Freq: Four times a day (QID) | INTRAMUSCULAR | Status: DC | PRN
  Administered 2013-10-12 (×2): 5 mg via INTRAVENOUS
  Filled 2013-10-11 (×2): qty 1

## 2013-10-11 MED ORDER — SODIUM CHLORIDE 0.9 % IV SOLN
INTRAVENOUS | Status: DC
  Administered 2013-10-11 – 2013-10-12 (×2): via INTRAVENOUS

## 2013-10-11 NOTE — Progress Notes (Signed)
Patient ID: Robert Walls, male   DOB: 01-13-1956, 58 y.o.   MRN: [id number]  [place] A. Robert Laughter, MD     [email or web address]          Robert Walls is an 58 y.o. male.   Assessment/Plan: 1. Acute encephalopathy likely due to postictal lethargy. His second etiology of the patient's seizures is not clear at this time. However, she does seem to have right frontal encephalomalacia consistent with an old infarct which could be the substrate of the patient's seizures. Additionally, there is some indication that he may have all call withdrawal seizures. In any situation, I agree with the patient being placed on antiseizure medications. An EEG will be obtained. Seizure precaution is advised. The patient should not operate machinery or drive vehicles. The patient is on Ultram outpatient which can be associated with seizures. It may be a good idea to discontinue this medication.  2. 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 The patient therefore should have a TEE and an event monitor to evaluate for current embolic sources. In the meantime, on antiplatelet agents are recommended. The case is discussed with the cardiologist Dr. Harl Bowie And also the hospitalist. 3. Alcoholism.  4. Hypertension.   Continues to complain of some posterior headaches and neck pain. He tells me this is a chronic issue however. No clinical seizures reported since being admitted.  GENERAL: He is in no acute distress.  HEENT: Supple. Atraumatic normocephalic. Poor dentition.  ABDOMEN: soft  EXTREMITIES: No edema  BACK: Normal.  SKIN: Normal by inspection.  MENTAL STATUS: He is awake and alert. He is oriented to person, place but not to time. He thinks his 15. He does follow commands well.  CRANIAL NERVES: Pupils are equal, round and reactive to light and accommodation; extra ocular movements are full, there is no significant  nystagmus; visual fields are full; upper and lower facial muscles are normal in strength and symmetric, there is no flattening of the nasolabial folds; tongue is midline; uvula is midline; shoulder elevation is normal.  MOTOR: Normal tone, bulk and strength; no pronator drift.  COORDINATION: Left finger to nose is normal, right finger to nose is normal, No rest tremor; no intention tremor; no postural tremor; no bradykinesia.       Objective: Vital signs in last 24 hours: Temp:  [97.6 F (36.4 C)-98 F (36.7 C)] 97.9 F (36.6 C) (07/22 0625) Pulse Rate:  [55-70] 70 (07/22 0625) Resp:  [18-20] 20 (07/22 0625) BP: (168-210)/(86-107) 210/98 mmHg (07/22 0625) SpO2:  [96 %-100 %] 100 % (07/22 0625) Weight:  [70.4 kg (155 lb 3.3 oz)] 70.4 kg (155 lb 3.3 oz) (07/21 1102)  Intake/Output from previous day: 07/21 0701 - 07/22 0700 In: 1440 [P.O.:1440] Out: 1100 [Urine:1100] Intake/Output this shift:   Nutritional status: General   Lab Results: Results for orders placed during the hospital encounter of 10/09/13 (from the past 48 hour(s))  URINALYSIS, ROUTINE W REFLEX MICROSCOPIC     Status: Abnormal   Collection Time    10/09/13  3:02 PM      Result Value Ref Range   Color, Urine YELLOW  YELLOW   APPearance HAZY (*) CLEAR   Specific Gravity, Urine 1.010  1.005 - 1.030   pH 6.0  5.0 - 8.0   Glucose, UA NEGATIVE  NEGATIVE mg/dL   Hgb urine dipstick MODERATE (*) NEGATIVE   Bilirubin Urine NEGATIVE  NEGATIVE   Ketones, ur  NEGATIVE  NEGATIVE mg/dL   Protein, ur NEGATIVE  NEGATIVE mg/dL   Urobilinogen, UA 0.2  0.0 - 1.0 mg/dL   Nitrite NEGATIVE  NEGATIVE   Leukocytes, UA NEGATIVE  NEGATIVE  URINE RAPID DRUG SCREEN (HOSP PERFORMED)     Status: Abnormal   Collection Time    10/09/13  3:02 PM      Result Value Ref Range   Opiates NONE DETECTED  NONE DETECTED   Cocaine NONE DETECTED  NONE DETECTED   Benzodiazepines POSITIVE (*) NONE DETECTED   Amphetamines NONE DETECTED  NONE  DETECTED   Tetrahydrocannabinol POSITIVE (*) NONE DETECTED   Barbiturates NONE DETECTED  NONE DETECTED   Comment:            DRUG SCREEN FOR MEDICAL PURPOSES     ONLY.  IF CONFIRMATION IS NEEDED     FOR ANY PURPOSE, NOTIFY LAB     WITHIN 5 DAYS.                LOWEST DETECTABLE LIMITS     FOR URINE DRUG SCREEN     Drug Class       Cutoff (ng/mL)     Amphetamine      1000     Barbiturate      200     Benzodiazepine   673     Tricyclics       419     Opiates          300     Cocaine          300     THC              50  URINE MICROSCOPIC-ADD ON     Status: Abnormal   Collection Time    10/09/13  3:02 PM      Result Value Ref Range   Squamous Epithelial / LPF FEW (*) RARE   WBC, UA 3-6  <3 WBC/hpf   RBC / HPF 3-6  <3 RBC/hpf   Bacteria, UA FEW (*) RARE   Casts GRANULAR CAST (*) NEGATIVE  CBC WITH DIFFERENTIAL     Status: Abnormal   Collection Time    10/09/13  3:16 PM      Result Value Ref Range   WBC 7.8  4.0 - 10.5 K/uL   RBC 4.21 (*) 4.22 - 5.81 MIL/uL   Hemoglobin 13.9  13.0 - 17.0 g/dL   HCT 40.4  39.0 - 52.0 %   MCV 96.0  78.0 - 100.0 fL   MCH 33.0  26.0 - 34.0 pg   MCHC 34.4  30.0 - 36.0 g/dL   RDW 12.3  11.5 - 15.5 %   Platelets 197  150 - 400 K/uL   Neutrophils Relative % 60  43 - 77 %   Neutro Abs 4.6  1.7 - 7.7 K/uL   Lymphocytes Relative 29  12 - 46 %   Lymphs Abs 2.3  0.7 - 4.0 K/uL   Monocytes Relative 8  3 - 12 %   Monocytes Absolute 0.6  0.1 - 1.0 K/uL   Eosinophils Relative 3  0 - 5 %   Eosinophils Absolute 0.2  0.0 - 0.7 K/uL   Basophils Relative 0  0 - 1 %   Basophils Absolute 0.0  0.0 - 0.1 K/uL  COMPREHENSIVE METABOLIC PANEL     Status: Abnormal   Collection Time    10/09/13  3:16 PM      Result  Value Ref Range   Sodium 137  137 - 147 mEq/L   Potassium 4.3  3.7 - 5.3 mEq/L   Chloride 99  96 - 112 mEq/L   CO2 27  19 - 32 mEq/L   Glucose, Bld 103 (*) 70 - 99 mg/dL   BUN 25 (*) 6 - 23 mg/dL   Creatinine, Ser 1.72 (*) 0.50 - 1.35 mg/dL    Calcium 8.9  8.4 - 10.5 mg/dL   Total Protein 7.4  6.0 - 8.3 g/dL   Albumin 3.8  3.5 - 5.2 g/dL   AST 21  0 - 37 U/L   ALT 20  0 - 53 U/L   Alkaline Phosphatase 69  39 - 117 U/L   Total Bilirubin 0.5  0.3 - 1.2 mg/dL   GFR calc non Af Amer 43 (*) >90 mL/min   GFR calc Af Amer 49 (*) >90 mL/min   Comment: (NOTE)     The eGFR has been calculated using the CKD EPI equation.     This calculation has not been validated in all clinical situations.     eGFR's persistently <90 mL/min signify possible Chronic Kidney     Disease.   Anion gap 11  5 - 15  TROPONIN I     Status: None   Collection Time    10/09/13  3:16 PM      Result Value Ref Range   Troponin I <0.30  <0.30 ng/mL   Comment:            Due to the release kinetics of cTnI,     a negative result within the first hours     of the onset of symptoms does not rule out     myocardial infarction with certainty.     If myocardial infarction is still suspected,     repeat the test at appropriate intervals.  COMPREHENSIVE METABOLIC PANEL     Status: Abnormal   Collection Time    10/10/13  5:41 AM      Result Value Ref Range   Sodium 140  137 - 147 mEq/L   Potassium 4.2  3.7 - 5.3 mEq/L   Chloride 104  96 - 112 mEq/L   CO2 26  19 - 32 mEq/L   Glucose, Bld 90  70 - 99 mg/dL   BUN 21  6 - 23 mg/dL   Creatinine, Ser 1.60 (*) 0.50 - 1.35 mg/dL   Calcium 9.0  8.4 - 10.5 mg/dL   Total Protein 7.1  6.0 - 8.3 g/dL   Albumin 3.7  3.5 - 5.2 g/dL   AST 20  0 - 37 U/L   ALT 19  0 - 53 U/L   Alkaline Phosphatase 66  39 - 117 U/L   Total Bilirubin 0.5  0.3 - 1.2 mg/dL   GFR calc non Af Amer 47 (*) >90 mL/min   GFR calc Af Amer 54 (*) >90 mL/min   Comment: (NOTE)     The eGFR has been calculated using the CKD EPI equation.     This calculation has not been validated in all clinical situations.     eGFR's persistently <90 mL/min signify possible Chronic Kidney     Disease.   Anion gap 10  5 - 15  CBC     Status: Abnormal   Collection  Time    10/10/13  5:41 AM      Result Value Ref Range   WBC 6.8  4.0 -  10.5 K/uL   RBC 4.08 (*) 4.22 - 5.81 MIL/uL   Hemoglobin 13.5  13.0 - 17.0 g/dL   HCT 39.3  39.0 - 52.0 %   MCV 96.3  78.0 - 100.0 fL   MCH 33.1  26.0 - 34.0 pg   MCHC 34.4  30.0 - 36.0 g/dL   RDW 12.3  11.5 - 15.5 %   Platelets 180  150 - 400 K/uL  PROTIME-INR     Status: None   Collection Time    10/10/13  5:41 AM      Result Value Ref Range   Prothrombin Time 13.9  11.6 - 15.2 seconds   INR 1.07  0.00 - 1.49  LIPID PANEL     Status: Abnormal   Collection Time    10/10/13  5:41 AM      Result Value Ref Range   Cholesterol 179  0 - 200 mg/dL   Triglycerides 132  <150 mg/dL   HDL 53  >39 mg/dL   Total CHOL/HDL Ratio 3.4     VLDL 26  0 - 40 mg/dL   LDL Cholesterol 100 (*) 0 - 99 mg/dL   Comment:            Total Cholesterol/HDL:CHD Risk     Coronary Heart Disease Risk Table                         Men   Women      1/2 Average Risk   3.4   3.3      Average Risk       5.0   4.4      2 X Average Risk   9.6   7.1      3 X Average Risk  23.4   11.0                Use the calculated Patient Ratio     above and the CHD Risk Table     to determine the patient's CHD Risk.                ATP III CLASSIFICATION (LDL):      <100     mg/dL   Optimal      100-129  mg/dL   Near or Above                        Optimal      130-159  mg/dL   Borderline      160-189  mg/dL   High      >190     mg/dL   Very High    Lipid Panel  Recent Labs  10/10/13 0541  CHOL 179  TRIG 132  HDL 53  CHOLHDL 3.4  VLDL 26  LDLCALC 100*    Studies/Results: Ct Head Wo Contrast  10/09/2013   CLINICAL DATA:  Seizures. Memory loss. Hit head during a seizure with bleeding from right ear. Neck pain, headache, and lightheadedness.  EXAM: CT HEAD WITHOUT CONTRAST  CT TEMPORAL BONES WITHOUT CONTRAST  CT CERVICAL SPINE WITHOUT CONTRAST  TECHNIQUE: Multidetector CT imaging of the head, temporal bones, and cervical spine were  performed using the standard protocol without intravenous contrast. Multiplanar CT image reconstructions of the cervical spine and temporal bones were also generated.  COMPARISON:  None.  FINDINGS: CT HEAD FINDINGS  There is no evidence of acute cortical infarct, intracranial hemorrhage, mass, midline shift,  or extra-axial fluid collection. There is mild cerebral atrophy. Patchy periventricular white matter hypoattenuation is nonspecific but is compatible with minimal chronic small vessel ischemic disease. The visualized orbits are unremarkable. Visualized paranasal sinuses are clear. There are bilateral mastoid effusions.  CT TEMPORAL BONES FINDINGS  RIGHT: A small amount of debris is present in the external auditory canal. Superior portion of the tympanic membrane is not well seen. There is a moderate amount of fluid/ soft tissue throughout the tympanic cavity. The malleus and incus appear intact. The stapes is partially obscured by fluid/soft tissue. The internal auditory canal, cochlea, vestibule, and semicircular canals are unremarkable. The mastoid air cells are completely opacified. There are areas of osseous thinning involving the tegmen mastoideum, and dehiscence cannot be excluded.  LEFT: Focal soft tissue thickening/ cerumen is noted along the superior aspect of the external auditory canal. Tympanic membrane does not appear thickened, with portions of it are not well seen. There is a small amount of fluid/soft tissue in the tympanic cavity, predominantly in the epitympanum. The ossicles appear intact. The ossicles appear intact. The tympanic cavity is clear. The internal auditory canal, cochlea, vestibule, and semicircular canals are unremarkable. There is complete opacification of the mastoid air cells. There are areas of osseous thinning involving the tegmen mastoideum, and dehiscence cannot be excluded.  CT CERVICAL SPINE FINDINGS  Vertebral alignment is normal. Vertebral body heights are preserved  without compression fracture. Prevertebral soft tissues are within normal limits. Moderate anterior osteophyte formation is present at C5-6 and C6-7. Mild disc space narrowing is noted at these two levels, with small amount of vacuum disc phenomenon present at C5-6. Moderate osseous overgrowth is noted at the atlantodental articulation. No acute cervical spine fracture is identified. Mild to moderate facet arthrosis is noted in the mid to upper cervical spine. Mild biapical pleural thickening is partially visualized.  IMPRESSION: 1. No evidence of acute intracranial abnormality. 2. Complete opacification of the mastoid air cells bilaterally with partial opacification of the right greater than left middle year. No temporal bone fracture is identified, and findings may reflect otomastoiditis. 3. No acute osseous abnormality identified in the cervical spine.   Electronically Signed   By: Logan Bores   On: 10/09/2013 16:26   Ct Cervical Spine Wo Contrast  10/09/2013   CLINICAL DATA:  Seizures. Memory loss. Hit head during a seizure with bleeding from right ear. Neck pain, headache, and lightheadedness.  EXAM: CT HEAD WITHOUT CONTRAST  CT TEMPORAL BONES WITHOUT CONTRAST  CT CERVICAL SPINE WITHOUT CONTRAST  TECHNIQUE: Multidetector CT imaging of the head, temporal bones, and cervical spine were performed using the standard protocol without intravenous contrast. Multiplanar CT image reconstructions of the cervical spine and temporal bones were also generated.  COMPARISON:  None.  FINDINGS: CT HEAD FINDINGS  There is no evidence of acute cortical infarct, intracranial hemorrhage, mass, midline shift, or extra-axial fluid collection. There is mild cerebral atrophy. Patchy periventricular white matter hypoattenuation is nonspecific but is compatible with minimal chronic small vessel ischemic disease. The visualized orbits are unremarkable. Visualized paranasal sinuses are clear. There are bilateral mastoid effusions.  CT  TEMPORAL BONES FINDINGS  RIGHT: A small amount of debris is present in the external auditory canal. Superior portion of the tympanic membrane is not well seen. There is a moderate amount of fluid/ soft tissue throughout the tympanic cavity. The malleus and incus appear intact. The stapes is partially obscured by fluid/soft tissue. The internal auditory canal, cochlea, vestibule, and semicircular canals  are unremarkable. The mastoid air cells are completely opacified. There are areas of osseous thinning involving the tegmen mastoideum, and dehiscence cannot be excluded.  LEFT: Focal soft tissue thickening/ cerumen is noted along the superior aspect of the external auditory canal. Tympanic membrane does not appear thickened, with portions of it are not well seen. There is a small amount of fluid/soft tissue in the tympanic cavity, predominantly in the epitympanum. The ossicles appear intact. The ossicles appear intact. The tympanic cavity is clear. The internal auditory canal, cochlea, vestibule, and semicircular canals are unremarkable. There is complete opacification of the mastoid air cells. There are areas of osseous thinning involving the tegmen mastoideum, and dehiscence cannot be excluded.  CT CERVICAL SPINE FINDINGS  Vertebral alignment is normal. Vertebral body heights are preserved without compression fracture. Prevertebral soft tissues are within normal limits. Moderate anterior osteophyte formation is present at C5-6 and C6-7. Mild disc space narrowing is noted at these two levels, with small amount of vacuum disc phenomenon present at C5-6. Moderate osseous overgrowth is noted at the atlantodental articulation. No acute cervical spine fracture is identified. Mild to moderate facet arthrosis is noted in the mid to upper cervical spine. Mild biapical pleural thickening is partially visualized.  IMPRESSION: 1. No evidence of acute intracranial abnormality. 2. Complete opacification of the mastoid air cells  bilaterally with partial opacification of the right greater than left middle year. No temporal bone fracture is identified, and findings may reflect otomastoiditis. 3. No acute osseous abnormality identified in the cervical spine.   Electronically Signed   By: Logan Bores   On: 10/09/2013 16:26   Mr Brain Wo Contrast  10/09/2013   CLINICAL DATA:  58 year old male with recent seizure activity. Headache and memory loss. Initial encounter.  EXAM: MRI HEAD WITHOUT CONTRAST  TECHNIQUE: Multiplanar, multiecho pulse sequences of the brain and surrounding structures were obtained without intravenous contrast.  COMPARISON:  Head, cervical spine and temporal bone CT without contrast 10/09/2013.  FINDINGS: Small curvilinear focus of restricted diffusion in the dorsal left thalamus very near but not definitely affecting the tail of the left hippocampus (series 100, image 17 and series 101, image 18). No hippocampal complex restricted diffusion. Small left of midline linear superior cerebellar artery region focus of restricted diffusion (series 100, image 12 and series 101, image 16).  Furthermore there is a subtle area of left insula diffusion restriction suspected on series 100, image 18.  No right hemisphere diffusion restriction. Major intracranial vascular flow voids are preserved, with some intracranial artery dolichoectasia.  Bilateral mastoid effusions, with facilitated diffusion. Mild paranasal sinus inflammatory changes. Negative visualized nasopharynx. Visualized orbit soft tissues are within normal limits.  Chronic T2 and FLAIR hyperintensity in the right cerebellar ganglia (series 7, image 6). Scattered nonspecific cerebral white matter T2 and FLAIR hyperintensity, most pronounced in the anterior frontal lobes and greater on the left. There is also fairly extensive chronic encephalomalacia of the inferior right temporal lobe at the fusiform gyrus (series 10, image 25). T2 signal in the hippocampal complexes is  within normal limits and symmetric.  No acute or chronic intracranial hemorrhage identified. No midline shift, mass effect, or evidence of intracranial mass lesion. No ventriculomegaly. Negative pituitary. Negative cervicomedullary junction and visualized cervical spine.  Normal bone marrow signal. Visualized scalp soft tissues are within normal limits. Tortuous cervical carotid arteries.  IMPRESSION: 1. 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  These are compatible with recent left posterior fossa small vessel ischemia. No associated mass effect or hemorrhage. 2. Superimposed small acute to subacute left insula infarct. This is in the left MCA territory, and could reflect synchronous small vessel disease or indicate recent Mild embolic event. 3. Nonspecific right temporal lobe encephalomalacia. Scattered nonspecific cerebral and cerebellar white matter signal abnormality. 4. Intra and extracranial artery dolichoectasia. 5. Bilateral mastoid effusions, appear to be simple/sterile.   Electronically Signed   By: Lars Pinks M.D.   On: 10/09/2013 18:52   US Carotid Bilateral  10/10/2013   CLINICAL DATA:  Stroke  EXAM: BILATERAL CAROTID DUPLEX ULTRASOUND  TECHNIQUE: Pearline Cables scale imaging, color Doppler and duplex ultrasound were performed of bilateral carotid and vertebral arteries in the neck.  COMPARISON:  None.  FINDINGS: Criteria: Quantification of carotid stenosis is based on velocity parameters that correlate the residual internal carotid diameter with NASCET-based stenosis levels, using the diameter of the distal internal carotid lumen as the denominator for stenosis measurement.  The following velocity measurements were obtained:  RIGHT  ICA:  74 cm/sec  CCA:  74 cm/sec  SYSTOLIC ICA/CCA RATIO:  1.0  DIASTOLIC ICA/CCA RATIO:  ECA:  78 cm/sec  LEFT  ICA:  70 cm/sec  CCA:  54 cm/sec  SYSTOLIC ICA/CCA RATIO:  1.3   DIASTOLIC ICA/CCA RATIO:  ECA:  80 cm/sec  RIGHT CAROTID ARTERY: Mild calcified plaque in the lower internal carotid. Minimal intimal thickening in the bulb. Low resistance internal carotid Doppler pattern.  RIGHT VERTEBRAL ARTERY:  Antegrade.  Low resistance Doppler pattern.  LEFT CAROTID ARTERY: Minimal scattered calcified plaque in the bulb. Low resistance internal carotid Doppler pattern. Mild calcified plaque in the lower internal carotid.  LEFT VERTEBRAL ARTERY:  Antegrade.  Normal Doppler pattern.  IMPRESSION: Less than 50% stenosis in the right and left internal carotid arteries.   Electronically Signed   By: Maryclare Bean M.D.   On: 10/10/2013 09:28   Ct Temporal Bones W/o Cm  10/09/2013   CLINICAL DATA:  Seizures. Memory loss. Hit head during a seizure with bleeding from right ear. Neck pain, headache, and lightheadedness.  EXAM: CT HEAD WITHOUT CONTRAST  CT TEMPORAL BONES WITHOUT CONTRAST  CT CERVICAL SPINE WITHOUT CONTRAST  TECHNIQUE: Multidetector CT imaging of the head, temporal bones, and cervical spine were performed using the standard protocol without intravenous contrast. Multiplanar CT image reconstructions of the cervical spine and temporal bones were also generated.  COMPARISON:  None.  FINDINGS: CT HEAD FINDINGS  There is no evidence of acute cortical infarct, intracranial hemorrhage, mass, midline shift, or extra-axial fluid collection. There is mild cerebral atrophy. Patchy periventricular white matter hypoattenuation is nonspecific but is compatible with minimal chronic small vessel ischemic disease. The visualized orbits are unremarkable. Visualized paranasal sinuses are clear. There are bilateral mastoid effusions.  CT TEMPORAL BONES FINDINGS  RIGHT: A small amount of debris is present in the external auditory canal. Superior portion of the tympanic membrane is not well seen. There is a moderate amount of fluid/ soft tissue throughout the tympanic cavity. The malleus and incus appear  intact. The stapes is partially obscured by fluid/soft tissue. The internal auditory canal, cochlea, vestibule, and semicircular canals are unremarkable. The mastoid air cells are completely opacified. There are areas of osseous thinning involving the tegmen mastoideum, and dehiscence cannot be excluded.  LEFT: Focal soft tissue thickening/ cerumen is noted along the superior aspect of the external auditory canal. Tympanic membrane does not appear thickened, with  portions of it are not well seen. There is a small amount of fluid/soft tissue in the tympanic cavity, predominantly in the epitympanum. The ossicles appear intact. The ossicles appear intact. The tympanic cavity is clear. The internal auditory canal, cochlea, vestibule, and semicircular canals are unremarkable. There is complete opacification of the mastoid air cells. There are areas of osseous thinning involving the tegmen mastoideum, and dehiscence cannot be excluded.  CT CERVICAL SPINE FINDINGS  Vertebral alignment is normal. Vertebral body heights are preserved without compression fracture. Prevertebral soft tissues are within normal limits. Moderate anterior osteophyte formation is present at C5-6 and C6-7. Mild disc space narrowing is noted at these two levels, with small amount of vacuum disc phenomenon present at C5-6. Moderate osseous overgrowth is noted at the atlantodental articulation. No acute cervical spine fracture is identified. Mild to moderate facet arthrosis is noted in the mid to upper cervical spine. Mild biapical pleural thickening is partially visualized.  IMPRESSION: 1. No evidence of acute intracranial abnormality. 2. Complete opacification of the mastoid air cells bilaterally with partial opacification of the right greater than left middle year. No temporal bone fracture is identified, and findings may reflect otomastoiditis. 3. No acute osseous abnormality identified in the cervical spine.   Electronically Signed   By: Logan Bores   On: 10/09/2013 16:26    Medications:  Scheduled Meds: . aspirin  325 mg Oral Daily  . heparin  5,000 Units Subcutaneous 3 times per day  . levETIRAcetam  500 mg Intravenous Q12H  . nicotine  21 mg Transdermal Daily   Continuous Infusions: . sodium chloride 75 mL/hr at 10/11/13 0148   PRN Meds:.cloNIDine, HYDROcodone-acetaminophen, LORazepam, ondansetron (ZOFRAN) IV, ondansetron, traZODone     LOS: 2 days   Skylor Schnapp A. Robert Walls, M.D.  Diplomate, [employer] adviser of Psychiatry and Neurology ( Neurology).

## 2013-10-11 NOTE — Progress Notes (Signed)
[facility] PROGRESS NOTE  AUSTIN HERD NWG:[id number] DOB: 07-Oct-1955 DOA: 10/09/2013 PCP: No PCP Per Patient  Subjective: Does not remember exactly what is going on him, but denies other complaints  Assessment/Plan:  CVA Patient came with altered mental status, MRI brain shows small subacute to acute infarct in the dorsal left thalamus, and superior Left cerebellum.  Superimposed small acute to subacute left insular infarct. Neurology has seen the patient, and recommend TEE to further ascertain the cause of possible embolic shower.  Cardiology notified for TEE. Carotid Dopplers did not show significant blockage of the carotid arteries, will cancel the transthoracic ultrasound. Continue aspirin 325 mg by mouth daily.  Seizures Patient has history of alcohol abuse, and there was a question of possible alcohol withdrawal seizures. Patient had seizures on 10/04/2013 at that time he was admitted to [facility] Will continue with Keppra, EEG has been ordered. Neurology following  Hypertension Patient's blood pressure is elevated, will hold clonidine and change it to when necessary Likely more than 48 hours after the acute stroke, we'll try to control blood pressure with oral antihypertensives.  Acute kidney injury- creatinine came down from 1.72-1.60, continue IV fluids. Follow BMP in a.m.  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 According to the ER notes, family member had witnessed the patient had 2 seizures yesterday. The patient apparently has had seizures before and these were thought perhaps to do with  alcohol withdrawal. The patient tells me that he drinks two 24 ounce cans of beer almost every night. He has not had any alcohol in the last 3 nights. This morning he was confused and still appears to be confused by his own admission, but thinks he is getting better. He is now being admitted for further investigation and management.  He is alert, oriented to person and place, denies any new complaints.   Objective: Filed Vitals:   10/11/13 1402  BP: 194/99  Pulse: 67  Temp: 98.4 F (36.9 C)  Resp: 20    Intake/Output Summary (Last 24 hours) at 10/11/13 1409 Last data filed at 10/11/13 1200  Gross per 24 hour  Intake    960 ml  Output      0 ml  Net    960 ml   Filed Weights   10/09/13 1332 10/10/13 1102  Weight: 70.308 kg (155 lb) 70.4 kg (155 lb 3.3 oz)    Exam:  Physical Exam: Head: Normocephalic, atraumatic.  Lungs: Normal respiratory effort. B/L Clear to auscultation, no crackles or wheezes.  Heart: Regular RR. S1 and S2 normal  Abdomen: BS normoactive. Soft, Nondistended, non-tender.  Extremities: No pretibial edema, no erythema Neuro : AO x 2, not oriented to time, no focal defecits  Data Reviewed: Basic Metabolic Panel:  Recent Labs Lab 10/09/13 1516 10/10/13 0541  NA 137 140  K 4.3 4.2  CL 99 104  CO2 27 26  GLUCOSE 103* 90  BUN 25* 21  CREATININE 1.72* 1.60*  CALCIUM 8.9 9.0   Liver Function Tests:  Recent Labs Lab 10/09/13 1516 10/10/13 0541  AST 21 20  ALT 20 19  ALKPHOS 69 66  BILITOT 0.5 0.5  PROT 7.4 7.1  ALBUMIN 3.8 3.7   No results found for this basename: LIPASE, AMYLASE,  in the last 168 hours No results found for this basename: AMMONIA,  in the last 168 hours CBC:  Recent Labs Lab 10/09/13 1516 10/10/13 0541  WBC 7.8 6.8  NEUTROABS 4.6  --   HGB 13.9 13.5  HCT 40.4 39.3  MCV 96.0 96.3  PLT 197 180   Cardiac Enzymes:  Recent Labs Lab 10/09/13 1516  TROPONINI <0.30     Studies: Ct Head Wo Contrast  10/09/2013    CLINICAL DATA:  Seizures. Memory loss. Hit head during a seizure with bleeding from right ear. Neck pain, headache, and lightheadedness.  EXAM: CT HEAD WITHOUT CONTRAST  CT TEMPORAL BONES WITHOUT CONTRAST  CT CERVICAL SPINE WITHOUT CONTRAST  TECHNIQUE: Multidetector CT imaging of the head, temporal bones, and cervical spine were performed using the standard protocol without intravenous contrast. Multiplanar CT image reconstructions of the cervical spine and temporal bones were also generated.  COMPARISON:  None.  FINDINGS: CT HEAD FINDINGS  There is no evidence of acute cortical infarct, intracranial hemorrhage, mass, midline shift, or extra-axial fluid collection. There is mild cerebral atrophy. Patchy periventricular white matter hypoattenuation is nonspecific but is compatible with minimal chronic small vessel ischemic disease. The visualized orbits are unremarkable. Visualized paranasal sinuses are clear. There are bilateral mastoid effusions.  CT TEMPORAL BONES FINDINGS  RIGHT: A small amount of debris is present in the external auditory canal. Superior portion of the tympanic membrane is not well seen. There is a moderate amount of fluid/ soft tissue throughout the tympanic cavity. The malleus and incus appear intact. The stapes is partially obscured by fluid/soft tissue. The internal auditory canal, cochlea, vestibule, and semicircular canals are unremarkable. The mastoid air cells are completely opacified. There are areas of osseous thinning involving the tegmen mastoideum, and dehiscence cannot be excluded.  LEFT: Focal soft tissue thickening/ cerumen is noted along the superior aspect of the external auditory canal. Tympanic membrane does not appear thickened, with portions of it are not well seen. There is a small amount of fluid/soft tissue in the tympanic cavity, predominantly in the epitympanum. The ossicles appear intact. The ossicles appear intact. The tympanic cavity is clear. The internal auditory  canal, cochlea, vestibule, and semicircular canals are unremarkable. There is complete opacification of the mastoid air cells. There are areas of osseous thinning involving the tegmen mastoideum, and dehiscence cannot be excluded.  CT CERVICAL SPINE FINDINGS  Vertebral alignment is normal. Vertebral body heights are preserved without compression fracture. Prevertebral soft tissues are within normal limits. Moderate anterior osteophyte formation is present at C5-6 and C6-7. Mild disc space narrowing is noted at these two levels, with small amount of vacuum disc phenomenon present at C5-6. Moderate osseous overgrowth is noted at the atlantodental articulation. No acute cervical spine fracture is identified. Mild to moderate facet arthrosis is noted in the mid to upper cervical spine. Mild biapical pleural thickening is partially visualized.  IMPRESSION: 1. No evidence of acute intracranial abnormality. 2. Complete opacification of the mastoid air cells bilaterally with partial opacification of the right greater than left middle year. No temporal bone fracture is identified, and findings may reflect otomastoiditis. 3. No acute osseous abnormality identified in the cervical spine.   Electronically Signed   By: Sebastian Ache   On: 10/09/2013 16:26   Ct Cervical Spine Wo Contrast  10/09/2013   CLINICAL DATA:  Seizures. Memory loss. Hit head during  a seizure with bleeding from right ear. Neck pain, headache, and lightheadedness.  EXAM: CT HEAD WITHOUT CONTRAST  CT TEMPORAL BONES WITHOUT CONTRAST  CT CERVICAL SPINE WITHOUT CONTRAST  TECHNIQUE: Multidetector CT imaging of the head, temporal bones, and cervical spine were performed using the standard protocol without intravenous contrast. Multiplanar CT image reconstructions of the cervical spine and temporal bones were also generated.  COMPARISON:  None.  FINDINGS: CT HEAD FINDINGS  There is no evidence of acute cortical infarct, intracranial hemorrhage, mass, midline  shift, or extra-axial fluid collection. There is mild cerebral atrophy. Patchy periventricular white matter hypoattenuation is nonspecific but is compatible with minimal chronic small vessel ischemic disease. The visualized orbits are unremarkable. Visualized paranasal sinuses are clear. There are bilateral mastoid effusions.  CT TEMPORAL BONES FINDINGS  RIGHT: A small amount of debris is present in the external auditory canal. Superior portion of the tympanic membrane is not well seen. There is a moderate amount of fluid/ soft tissue throughout the tympanic cavity. The malleus and incus appear intact. The stapes is partially obscured by fluid/soft tissue. The internal auditory canal, cochlea, vestibule, and semicircular canals are unremarkable. The mastoid air cells are completely opacified. There are areas of osseous thinning involving the tegmen mastoideum, and dehiscence cannot be excluded.  LEFT: Focal soft tissue thickening/ cerumen is noted along the superior aspect of the external auditory canal. Tympanic membrane does not appear thickened, with portions of it are not well seen. There is a small amount of fluid/soft tissue in the tympanic cavity, predominantly in the epitympanum. The ossicles appear intact. The ossicles appear intact. The tympanic cavity is clear. The internal auditory canal, cochlea, vestibule, and semicircular canals are unremarkable. There is complete opacification of the mastoid air cells. There are areas of osseous thinning involving the tegmen mastoideum, and dehiscence cannot be excluded.  CT CERVICAL SPINE FINDINGS  Vertebral alignment is normal. Vertebral body heights are preserved without compression fracture. Prevertebral soft tissues are within normal limits. Moderate anterior osteophyte formation is present at C5-6 and C6-7. Mild disc space narrowing is noted at these two levels, with small amount of vacuum disc phenomenon present at C5-6. Moderate osseous overgrowth is noted at  the atlantodental articulation. No acute cervical spine fracture is identified. Mild to moderate facet arthrosis is noted in the mid to upper cervical spine. Mild biapical pleural thickening is partially visualized.  IMPRESSION: 1. No evidence of acute intracranial abnormality. 2. Complete opacification of the mastoid air cells bilaterally with partial opacification of the right greater than left middle year. No temporal bone fracture is identified, and findings may reflect otomastoiditis. 3. No acute osseous abnormality identified in the cervical spine.   Electronically Signed   By: Sebastian Ache   On: 10/09/2013 16:26   Mr Brain Wo Contrast  10/09/2013   CLINICAL DATA:  58 year old male with recent seizure activity. Headache and memory loss. Initial encounter.  EXAM: MRI HEAD WITHOUT CONTRAST  TECHNIQUE: Multiplanar, multiecho pulse sequences of the brain and surrounding structures were obtained without intravenous contrast.  COMPARISON:  Head, cervical spine and temporal bone CT without contrast 10/09/2013.  FINDINGS: Small curvilinear focus of restricted diffusion in the dorsal left thalamus very near but not definitely affecting the tail of the left hippocampus (series 100, image 17 and series 101, image 18). No hippocampal complex restricted diffusion. Small left of midline linear superior cerebellar artery region focus of restricted diffusion (series 100, image 12 and series 101, image 16).  Furthermore there is  a subtle area of left insula diffusion restriction suspected on series 100, image 18.  No right hemisphere diffusion restriction. Major intracranial vascular flow voids are preserved, with some intracranial artery dolichoectasia.  Bilateral mastoid effusions, with facilitated diffusion. Mild paranasal sinus inflammatory changes. Negative visualized nasopharynx. Visualized orbit soft tissues are within normal limits.  Chronic T2 and FLAIR hyperintensity in the right cerebellar ganglia (series 7,  image 6). Scattered nonspecific cerebral white matter T2 and FLAIR hyperintensity, most pronounced in the anterior frontal lobes and greater on the left. There is also fairly extensive chronic encephalomalacia of the inferior right temporal lobe at the fusiform gyrus (series 10, image 25). T2 signal in the hippocampal complexes is within normal limits and symmetric.  No acute or chronic intracranial hemorrhage identified. No midline shift, mass effect, or evidence of intracranial mass lesion. No ventriculomegaly. Negative pituitary. Negative cervicomedullary junction and visualized cervical spine.  Normal bone marrow signal. Visualized scalp soft tissues are within normal limits. Tortuous cervical carotid arteries.  IMPRESSION: 1. 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 These are compatible with recent left posterior fossa small vessel ischemia. No associated mass effect or hemorrhage. 2. Superimposed small acute to subacute left insula infarct. This is in the left MCA territory, and could reflect synchronous small vessel disease or indicate recent Mild embolic event. 3. Nonspecific right temporal lobe encephalomalacia. Scattered nonspecific cerebral and cerebellar white matter signal abnormality. 4. Intra and extracranial artery dolichoectasia. 5. Bilateral mastoid effusions, appear to be simple/sterile.   Electronically Signed   By: Augusto GambleLee  Hall M.D.   On: 10/09/2013 18:52   [place] Carotid Bilateral  10/10/2013   CLINICAL DATA:  Stroke  EXAM: BILATERAL CAROTID DUPLEX ULTRASOUND  TECHNIQUE: Wallace CullensGray scale imaging, color Doppler and duplex ultrasound were performed of bilateral carotid and vertebral arteries in the neck.  COMPARISON:  None.  FINDINGS: Criteria: Quantification of carotid stenosis is based on velocity parameters that correlate the residual internal carotid diameter with NASCET-based stenosis levels, using the  diameter of the distal internal carotid lumen as the denominator for stenosis measurement.  The following velocity measurements were obtained:  RIGHT  ICA:  74 cm/sec  CCA:  74 cm/sec  SYSTOLIC ICA/CCA RATIO:  1.0  DIASTOLIC ICA/CCA RATIO:  ECA:  78 cm/sec  LEFT  ICA:  70 cm/sec  CCA:  54 cm/sec  SYSTOLIC ICA/CCA RATIO:  1.3  DIASTOLIC ICA/CCA RATIO:  ECA:  80 cm/sec  RIGHT CAROTID ARTERY: Mild calcified plaque in the lower internal carotid. Minimal intimal thickening in the bulb. Low resistance internal carotid Doppler pattern.  RIGHT VERTEBRAL ARTERY:  Antegrade.  Low resistance Doppler pattern.  LEFT CAROTID ARTERY: Minimal scattered calcified plaque in the bulb. Low resistance internal carotid Doppler pattern. Mild calcified plaque in the lower internal carotid.  LEFT VERTEBRAL ARTERY:  Antegrade.  Normal Doppler pattern.  IMPRESSION: Less than 50% stenosis in the right and left internal carotid arteries.   Electronically Signed   By: Maryclare BeanArt  Hoss M.D.   On: 10/10/2013 09:28   Ct Temporal Bones W/o Cm  10/09/2013   CLINICAL DATA:  Seizures. Memory loss. Hit head during a seizure with bleeding from right ear. Neck pain, headache, and lightheadedness.  EXAM: CT HEAD WITHOUT CONTRAST  CT TEMPORAL BONES WITHOUT CONTRAST  CT CERVICAL SPINE WITHOUT CONTRAST  TECHNIQUE: Multidetector CT imaging of the head, temporal bones, and cervical spine were performed using the standard protocol  without intravenous contrast. Multiplanar CT image reconstructions of the cervical spine and temporal bones were also generated.  COMPARISON:  None.  FINDINGS: CT HEAD FINDINGS  There is no evidence of acute cortical infarct, intracranial hemorrhage, mass, midline shift, or extra-axial fluid collection. There is mild cerebral atrophy. Patchy periventricular white matter hypoattenuation is nonspecific but is compatible with minimal chronic small vessel ischemic disease. The visualized orbits are unremarkable. Visualized paranasal sinuses  are clear. There are bilateral mastoid effusions.  CT TEMPORAL BONES FINDINGS  RIGHT: A small amount of debris is present in the external auditory canal. Superior portion of the tympanic membrane is not well seen. There is a moderate amount of fluid/ soft tissue throughout the tympanic cavity. The malleus and incus appear intact. The stapes is partially obscured by fluid/soft tissue. The internal auditory canal, cochlea, vestibule, and semicircular canals are unremarkable. The mastoid air cells are completely opacified. There are areas of osseous thinning involving the tegmen mastoideum, and dehiscence cannot be excluded.  LEFT: Focal soft tissue thickening/ cerumen is noted along the superior aspect of the external auditory canal. Tympanic membrane does not appear thickened, with portions of it are not well seen. There is a small amount of fluid/soft tissue in the tympanic cavity, predominantly in the epitympanum. The ossicles appear intact. The ossicles appear intact. The tympanic cavity is clear. The internal auditory canal, cochlea, vestibule, and semicircular canals are unremarkable. There is complete opacification of the mastoid air cells. There are areas of osseous thinning involving the tegmen mastoideum, and dehiscence cannot be excluded.  CT CERVICAL SPINE FINDINGS  Vertebral alignment is normal. Vertebral body heights are preserved without compression fracture. Prevertebral soft tissues are within normal limits. Moderate anterior osteophyte formation is present at C5-6 and C6-7. Mild disc space narrowing is noted at these two levels, with small amount of vacuum disc phenomenon present at C5-6. Moderate osseous overgrowth is noted at the atlantodental articulation. No acute cervical spine fracture is identified. Mild to moderate facet arthrosis is noted in the mid to upper cervical spine. Mild biapical pleural thickening is partially visualized.  IMPRESSION: 1. No evidence of acute intracranial  abnormality. 2. Complete opacification of the mastoid air cells bilaterally with partial opacification of the right greater than left middle year. No temporal bone fracture is identified, and findings may reflect otomastoiditis. 3. No acute osseous abnormality identified in the cervical spine.   Electronically Signed   By: Sebastian Ache   On: 10/09/2013 16:26    Scheduled Meds: . aspirin  325 mg Oral Daily  . heparin  5,000 Units Subcutaneous 3 times per day  . levETIRAcetam  500 mg Intravenous Q12H  . nicotine  21 mg Transdermal Daily   Continuous Infusions: . sodium chloride 75 mL/hr at 10/11/13 0148    Active Problems:   Altered mental status   Seizure   Alcohol abuse   HTN (hypertension)   CVA (cerebral infarction)    Time spent: 25 min    [facility] A  [facility] Pager [phone]*. If 7PM-7AM, please contact night-coverage at [email or web address], password [facility] 10/11/2013, 2:09 PM  LOS: 2 days

## 2013-10-11 NOTE — Progress Notes (Signed)
Contacted about possible TEE for patient given CVA concerning for possible cardioembolic CVA. Will work to have scheduled for tomorrow, make NPO at midnight. We have also been asked to help arrange cardiac monitor at home.    Dominga FerryJ Camil Hausmann MD

## 2013-10-11 NOTE — Procedures (Signed)
  [facility] Robert Walls A. Gerilyn Pilgrimoonquah, MD     [email or web address]           HISTORY: This is a 58 year old man who presents with multiple seizures.  MEDICATIONS: Scheduled Meds: . aspirin  325 mg Oral Daily  . heparin  5,000 Units Subcutaneous 3 times per day  . levETIRAcetam  500 mg Intravenous Q12H  . nicotine  21 mg Transdermal Daily   Continuous Infusions: . sodium chloride 75 mL/hr at 10/11/13 0148   PRN Meds:.cloNIDine, HYDROcodone-acetaminophen, LORazepam, ondansetron (ZOFRAN) IV, ondansetron, traZODone  Prior to Admission medications   Medication Sig Start Date End Date Taking? Authorizing Provider  Aspirin-Acetaminophen-Caffeine (GOODY HEADACHE PO) Take 1 packet by mouth 2 (two) times daily as needed (headaches/ backache).   Yes Historical Provider, MD  cloNIDine (CATAPRES) 0.3 MG tablet Take 0.3 mg by mouth 2 (two) times daily.   Yes Historical Provider, MD  traMADol (ULTRAM) 50 MG tablet Take 50 mg by mouth once.   Yes Historical Provider, MD      ANALYSIS: A 16 channel recording using standard 10 20 measurements is conducted for 21 minutes. There is a well-formed to posterior dominant rhythm of 14 Hz which attenuates with eye movement. There is also beta activity observed in the frontal areas. Awake and drowsy activities are observed. Photic simulation and hyperventilation were not carried out. There are no focal or lateralized slowing. There are no epileptiform activities observed.   IMPRESSION: 1. This is a normal recording of the awake and drowsy states.      Ozell Ferrera A. Gerilyn Pilgrimoonquah, M.D.  Diplomate, Biomedical engineerAmerican Board of Psychiatry and Neurology ( Neurology).

## 2013-10-11 NOTE — Progress Notes (Signed)
Patient resting quietly in bed with eyes open watching TV, call bell within reach and bed alarm on.

## 2013-10-11 NOTE — Progress Notes (Signed)
[place] returned back [place]. Patient changed back into hospital gown , IV was reinserted and plan of care was discussed with patient. Patient states he made a mistake and this will not happen again, "Im not going to try to leave hospital again".

## 2013-10-12 ENCOUNTER — Encounter (HOSPITAL_COMMUNITY): Payer: Self-pay | Admitting: Cardiology

## 2013-10-12 ENCOUNTER — Inpatient Hospital Stay (HOSPITAL_COMMUNITY)
Admit: 2013-10-12 | Discharge: 2013-10-12 | Disposition: A | Discharge status: Home / Self Care | Payer: Medicaid Other | Attending: Cardiology | Admitting: Cardiology

## 2013-10-12 ENCOUNTER — Encounter (HOSPITAL_COMMUNITY)
Admission: EM | Disposition: A | Discharge status: Home / Self Care | Payer: Self-pay | Source: Home / Self Care | Attending: Family Medicine

## 2013-10-12 DIAGNOSIS — N179 Acute kidney failure, unspecified: Secondary | ICD-10-CM

## 2013-10-12 DIAGNOSIS — I6350 Cerebral infarction due to unspecified occlusion or stenosis of unspecified cerebral artery: Principal | ICD-10-CM

## 2013-10-12 DIAGNOSIS — G9340 Encephalopathy, unspecified: Secondary | ICD-10-CM

## 2013-10-12 LAB — CBC
HEMATOCRIT: 45.4 % (ref 39.0–52.0)
Hemoglobin: 15.7 g/dL (ref 13.0–17.0)
MCH: 33 pg (ref 26.0–34.0)
MCHC: 34.6 g/dL (ref 30.0–36.0)
MCV: 95.4 fL (ref 78.0–100.0)
PLATELETS: 225 10*3/uL (ref 150–400)
RBC: 4.76 MIL/uL (ref 4.22–5.81)
RDW: 12.1 % (ref 11.5–15.5)
WBC: 9.2 10*3/uL (ref 4.0–10.5)

## 2013-10-12 LAB — SURGICAL PCR SCREEN
MRSA, PCR: NEGATIVE
Staphylococcus aureus: POSITIVE — AB

## 2013-10-12 LAB — BASIC METABOLIC PANEL
ANION GAP: 12 (ref 5–15)
BUN: 15 mg/dL (ref 6–23)
CO2: 29 meq/L (ref 19–32)
Calcium: 9.6 mg/dL (ref 8.4–10.5)
Chloride: 102 mEq/L (ref 96–112)
Creatinine, Ser: 1.38 mg/dL — ABNORMAL HIGH (ref 0.50–1.35)
GFR calc Af Amer: 65 mL/min — ABNORMAL LOW (ref >90)
GFR calc non Af Amer: 56 mL/min — ABNORMAL LOW (ref >90)
GLUCOSE: 102 mg/dL — AB (ref 70–99)
Potassium: 4.1 mEq/L (ref 3.7–5.3)
SODIUM: 143 meq/L (ref 137–147)

## 2013-10-12 SURGERY — ECHOCARDIOGRAM, TRANSESOPHAGEAL
Anesthesia: Moderate Sedation

## 2013-10-12 MED ORDER — MEPERIDINE HCL 100 MG/ML IJ SOLN
INTRAMUSCULAR | Status: AC
  Filled 2013-10-12: qty 1

## 2013-10-12 MED ORDER — BUTAMBEN-TETRACAINE-BENZOCAINE 2-2-14 % EX AERO
INHALATION_SPRAY | CUTANEOUS | Status: DC | PRN
  Administered 2013-10-12: 2 via TOPICAL

## 2013-10-12 MED ORDER — SIMVASTATIN 20 MG PO TABS: 20.0000 mg | ORAL_TABLET | Freq: Every day | ORAL | Status: DC

## 2013-10-12 MED ORDER — MIDAZOLAM HCL 5 MG/5ML IJ SOLN
INTRAMUSCULAR | Status: DC | PRN
  Administered 2013-10-12 (×2): 2 mg via INTRAVENOUS
  Administered 2013-10-12: 3 mg via INTRAVENOUS
  Administered 2013-10-12: 2 mg via INTRAVENOUS

## 2013-10-12 MED ORDER — FENTANYL CITRATE 0.05 MG/ML IJ SOLN
INTRAMUSCULAR | Status: AC
  Filled 2013-10-12: qty 2

## 2013-10-12 MED ORDER — SODIUM CHLORIDE BACTERIOSTATIC 0.9 % IJ SOLN
INTRAMUSCULAR | Status: AC
  Filled 2013-10-12: qty 20

## 2013-10-12 MED ORDER — FENTANYL CITRATE 0.05 MG/ML IJ SOLN
INTRAMUSCULAR | Status: DC | PRN
  Administered 2013-10-12 (×3): 25 ug via INTRAVENOUS

## 2013-10-12 MED ORDER — CLONIDINE HCL 0.2 MG PO TABS
0.3000 mg | ORAL_TABLET | Freq: Two times a day (BID) | ORAL | Status: DC
  Administered 2013-10-12: 0.3 mg via ORAL
  Filled 2013-10-12 (×2): qty 1

## 2013-10-12 MED ORDER — MIDAZOLAM HCL 5 MG/5ML IJ SOLN
INTRAMUSCULAR | Status: AC
  Filled 2013-10-12: qty 5

## 2013-10-12 MED ORDER — LIDOCAINE VISCOUS 2 % MT SOLN
OROMUCOSAL | Status: AC
  Filled 2013-10-12: qty 15

## 2013-10-12 MED ORDER — LEVETIRACETAM 500 MG PO TABS
500.0000 mg | ORAL_TABLET | Freq: Two times a day (BID) | ORAL | Status: DC
  Administered 2013-10-12: 500 mg via ORAL
  Filled 2013-10-12: qty 1

## 2013-10-12 MED ORDER — PNEUMOCOCCAL VAC POLYVALENT 25 MCG/0.5ML IJ INJ
0.5000 mL | INJECTION | INTRAMUSCULAR | Status: DC
  Filled 2013-10-12: qty 0.5

## 2013-10-12 MED ORDER — MEPERIDINE HCL 25 MG/ML IJ SOLN
INTRAMUSCULAR | Status: DC | PRN
  Administered 2013-10-12: 12.5 mg via INTRAVENOUS

## 2013-10-12 MED ORDER — CHLORHEXIDINE GLUCONATE CLOTH 2 % EX PADS
6.0000 | MEDICATED_PAD | Freq: Every day | CUTANEOUS | Status: DC
Start: 2013-10-12 — End: 2013-10-13
  Administered 2013-10-12: 6 via TOPICAL

## 2013-10-12 MED ORDER — MUPIROCIN 2 % EX OINT
1.0000 "application " | TOPICAL_OINTMENT | Freq: Two times a day (BID) | CUTANEOUS | Status: DC
  Administered 2013-10-12 (×2): 1 via NASAL
  Filled 2013-10-12: qty 22

## 2013-10-12 NOTE — Progress Notes (Signed)
PROGRESS NOTE  Robert CrowBryan K Walls WUJ:[id number] DOB: 1955/05/11 DOA: 10/09/2013 PCP: No PCP Per Patient  Summary: 58 year old man who presented with history of several seizures witnessed by family. MRI revealed multiple infarcts. Seen in consultation with neurology and started on Keppra. He subsequently left but was escorted back by police. Currently TEE is planned for tomorrow to rule out embolic source. He remains encephalopathic.  According to chart review, [facility], referred to neurology as an outpatient. Patient reported being admitted for seizures at that time.   Assessment/Plan: 1. Acute encephalopathy/memory loss thought to be secondary to post ictal etiology. 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 Present for several weeks. 2. Small acute to subacute infarcts. Suspect a cardioembolic phenomenon. LDL 100. Bilateral carotid ultrasound unremarkable. 3. Seizure disorder. No seizure since admission. Continue Keppra. Discontinued tramadol. 4. Acute kidney injury. Resolved. Etiology unclear, possibly related to NSAIDs for dehydration. 5. Hypertension poor control, likely rebound hypertension of clonidine. 6. History of alcohol abuse/dependence. History of heavy alcohol use in the past per son. History of daily alcohol use 2-3 beers per day until approximately 3 weeks ago. Sensation several weeks prior to seizure. 7. Urine drug screen positive for benzodiazepines and marijuana   Encephalopathy likely related to strokes, long-term alcohol abuse/dependence, seizure sequela however check B12, TSH, RPR, ammonia. No evidence of infection.  Resume clonidine.  Event monitor on discharge. Replace telemetry if patient will allow.  Statin  Check urine culture  Code Status: full code DVT prophylaxis: heparin subq Family Communication: Discussed in detail with sister of patient, son of patient at bedside, review current testing and  clinical impressions. Disposition Plan: Home, likely next 24 hours  Brendia Sacksaniel Milas Schappell, MD  [facility]  Pager [phone] If 7PM-7AM, please contact night-coverage at [email or web address], password [email or web address] 10/12/2013, 5:00 PM  LOS: 3 days   Consultants:  Neurology  Cardiology  Procedures:  7/21 EEG: This is a normal recording of the awake and drowsy states.  7/23 aborted TEE, unable to sedate  Antibiotics:  HPI/Subjective: Unable to complete TEE secondary to inability to sedate.  Patient has no complaints. No focal neurologic deficits. Eating without difficulty. He is confused.  Objective: Filed Vitals:   10/12/13 1510 10/12/13 1515 10/12/13 1520 10/12/13 1525  BP: 175/86 174/88 188/96 191/95  Pulse: 84 84 101 88  Temp:      TempSrc:      Resp: 20 37 15 22  Height:      Weight:      SpO2: 100% 100% 100% 100%    Intake/Output Summary (Last 24 hours) at 10/12/13 1700 Last data filed at 10/12/13 1248  Gross per 24 hour  Intake 3332.25 ml  Output      0 ml  Net 3332.25 ml     Filed Weights   10/09/13 1332 10/10/13 1102  Weight: 70.308 kg (155 lb) 70.4 kg (155 lb 3.3 oz)    Exam:     Afebrile, hypertensive. Gen. Appears calm and comfortable. Nontoxic. Eating dinner. Appears well.  Psych. Alert. Speech inappropriate at times. Clearly disoriented to location. Oriented to month, year, president. Oriented to self, family members.  Eyes. Appears grossly unremarkable  Cardiovascular. Regular rate and rhythm. No murmur, rub or gallop. No lower extremity edema.  Respiratory. Clear to auscultation bilaterally. No wheezes, rales or rhonchi. Normal respiratory effort.  Musculoskeletal. Moves all extremities.  Neurologic. Grossly nonfocal.  Data Reviewed:  Chemistry: Creatinine trending towards normal, 1.38. Basic metabolic panel otherwise unremarkable previous  hepatic function panel unremarkable. LDL 100.  Heme: CBC unremarkable.  Imaging: Carotid ultrasound  unremarkable. Less than 50% stenosis bilaterally.  Scheduled Meds: . amLODipine  10 mg Oral Daily  . aspirin  325 mg Oral Daily  . Chlorhexidine Gluconate Cloth  6 each Topical Daily  . fentaNYL      . heparin  5,000 Units Subcutaneous 3 times per day  . levETIRAcetam  500 mg Intravenous Q12H  . lidocaine      . meperidine      . midazolam      . midazolam      . mupirocin ointment  1 application Nasal BID  . nicotine  21 mg Transdermal Daily  . [START ON 10/13/2013] pneumococcal 23 valent vaccine  0.5 mL Intramuscular Tomorrow-1000  . sodium chloride bacteriostatic       Continuous Infusions: . sodium chloride 75 mL/hr at 10/11/13 1536    Principal Problem:   CVA (cerebral infarction) Active Problems:   Seizure   Alcohol abuse   HTN (hypertension)   Acute encephalopathy   Acute kidney injury   History of alcohol abuse   Time spent 35 minutes, greater than 50% in counseling and coordination of care

## 2013-10-12 NOTE — Progress Notes (Signed)
Patient ID: Robert Walls, male   DOB: 12-20-1955, 58 y.o.   MRN: [id number]  [name], MD     [email or web address]          Robert Walls is an 58 y.o. male.   Assessment/Plan: 1. Acute encephalopathy likely due to postictal lethargy. His second etiology of the patient's seizures is not clear at this time. However, she does seem to have right frontal encephalomalacia consistent with an old infarct which could be the substrate of the patient's seizures. Additionally, there is some indication that he may have all call withdrawal seizures. In any situation, I agree with the patient being placed on antiseizure medications. On Keppra. 2. 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 The patient therefore should have a TEE and an event monitor to evaluate for current embolic sources. In the meantime, on antiplatelet agents are recommended.  3. Alcoholism.  4. Hypertension.   The patient states he cannot be done because of agitation. He seems more confused today. No clinical seizures are reported however.  GENERAL: He is in no acute distress.  HEENT: Supple. Atraumatic normocephalic. Poor dentition.  ABDOMEN: soft  EXTREMITIES: No edema  BACK: Normal.  SKIN: Normal by inspection.  MENTAL STATUS:  He is awake and alert but clearly more confused. He is not oriented to time included month or year. He knows he is in the hospital but thinks he is in the hospital in [place]. CRANIAL NERVES: Pupils are equal, round and reactive to light and accommodation; extra ocular movements are full, there is no significant nystagmus; visual fields are full; upper and lower facial muscles are normal in strength and symmetric, there is no flattening of the nasolabial folds; tongue is midline; uvula is midline; shoulder elevation is normal.  MOTOR: Normal tone, bulk and strength; no pronator drift.    COORDINATION: Left finger to nose is normal, right finger to nose is normal, No rest tremor; no intention tremor; no postural tremor; no bradykinesia.       Objective: Vital signs in last 24 hours: Temp:  [97.9 F (36.6 C)-98 F (36.7 C)] 98 F (36.7 C) (07/23 1347) Pulse Rate:  [76-101] 88 (07/23 1525) Resp:  [14-37] 22 (07/23 1525) BP: (144-191)/(80-119) 191/95 mmHg (07/23 1525) SpO2:  [97 %-100 %] 100 % (07/23 1525)  Intake/Output from previous day: 07/22 0701 - 07/23 0700 In: 4052.3 [P.O.:720; I.V.:2832.3; IV Piggyback:500] Out: -  Intake/Output this shift:   Nutritional status: General   Lab Results: Results for orders placed during the hospital encounter of 10/09/13 (from the past 48 hour(s))  SURGICAL PCR SCREEN     Status: Abnormal   Collection Time    10/12/13  5:37 AM      Result Value Ref Range   MRSA, PCR NEGATIVE  NEGATIVE   Staphylococcus aureus POSITIVE (*) NEGATIVE   Comment:            The Xpert SA Assay (FDA     approved for NASAL specimens     in patients over 51 years of age),     is one component of     a comprehensive surveillance     program.  Test performance has     been validated by [employer] for patients greater     than or equal to 1 year old.     It is not intended     to diagnose  infection nor to     guide or monitor treatment.     RESULT CALLED TO, READ BACK BY AND VERIFIED WITH:     ABBIE GORDON RN ON 0723 AT 0850 BY RESSEGGER R  BASIC METABOLIC PANEL     Status: Abnormal   Collection Time    10/12/13  5:50 AM      Result Value Ref Range   Sodium 143  137 - 147 mEq/L   Potassium 4.1  3.7 - 5.3 mEq/L   Chloride 102  96 - 112 mEq/L   CO2 29  19 - 32 mEq/L   Glucose, Bld 102 (*) 70 - 99 mg/dL   BUN 15  6 - 23 mg/dL   Creatinine, Ser 1.38 (*) 0.50 - 1.35 mg/dL   Calcium 9.6  8.4 - 10.5 mg/dL   GFR calc non Af Amer 56 (*) >90 mL/min   GFR calc Af Amer 65 (*) >90 mL/min   Comment: (NOTE)     The eGFR has been  calculated using the CKD EPI equation.     This calculation has not been validated in all clinical situations.     eGFR's persistently <90 mL/min signify possible Chronic Kidney     Disease.   Anion gap 12  5 - 15  CBC     Status: None   Collection Time    10/12/13  5:50 AM      Result Value Ref Range   WBC 9.2  4.0 - 10.5 K/uL   RBC 4.76  4.22 - 5.81 MIL/uL   Hemoglobin 15.7  13.0 - 17.0 g/dL   HCT 45.4  39.0 - 52.0 %   MCV 95.4  78.0 - 100.0 fL   MCH 33.0  26.0 - 34.0 pg   MCHC 34.6  30.0 - 36.0 g/dL   RDW 12.1  11.5 - 15.5 %   Platelets 225  150 - 400 K/uL    Lipid Panel  Recent Labs  10/10/13 0541  CHOL 179  TRIG 132  HDL 53  CHOLHDL 3.4  VLDL 26  LDLCALC 100*    Studies/Results: No results found.  Medications:  Scheduled Meds: . amLODipine  10 mg Oral Daily  . aspirin  325 mg Oral Daily  . Chlorhexidine Gluconate Cloth  6 each Topical Daily  . cloNIDine  0.3 mg Oral BID  . fentaNYL      . heparin  5,000 Units Subcutaneous 3 times per day  . levETIRAcetam  500 mg Oral BID  . lidocaine      . meperidine      . midazolam      . midazolam      . mupirocin ointment  1 application Nasal BID  . nicotine  21 mg Transdermal Daily  . [START ON 10/13/2013] pneumococcal 23 valent vaccine  0.5 mL Intramuscular Tomorrow-1000  . [START ON 10/13/2013] simvastatin  20 mg Oral q1800  . sodium chloride bacteriostatic       Continuous Infusions:   PRN Meds:.hydrALAZINE, HYDROcodone-acetaminophen, LORazepam, ondansetron (ZOFRAN) IV, ondansetron, traZODone     LOS: 3 days   Anjelita Sheahan A. Merlene Laughter, M.D.  Diplomate, Tax adviser of Psychiatry and Neurology ( Neurology).

## 2013-10-12 NOTE — Consult Note (Signed)
Consulting cardiologist: Dr. Satira Sark  Clinical Summary Robert Walls is a 58 y.o.male admitted to the hospital with altered mental status and diagnosis of stroke. Brain MRI shows small subacute to acute infarcts in the dorsal left thalamus and superior left cerebellum, as well as superimposed small acute to subacute left insular infarct. Neurology has evaluated the patient and a TEE has been requested to exclude cardioembolic source. This was discussed with Dr. Harl Bowie yesterday, and the procedure has been scheduled today with me. I met with the patient and discussed the procedure, also spoke with the patient's son Robert Walls by phone.  Patient denies any history of dysphasia or esophageal pathology. He recalls having an endoscopic procedure years ago, details are not clear.  Carotid Dopplers demonstrate less than 50% bilateral ICA stenoses.   Allergies  Allergen Reactions  . Lisinopril Other (See Comments)    Depleted sodium; resulted in hospitalization, with the HCTZ     Medications Scheduled Medications: . amLODipine  10 mg Oral Daily  . aspirin  325 mg Oral Daily  . Chlorhexidine Gluconate Cloth  6 each Topical Daily  . heparin  5,000 Units Subcutaneous 3 times per day  . levETIRAcetam  500 mg Intravenous Q12H  . mupirocin ointment  1 application Nasal BID  . nicotine  21 mg Transdermal Daily    Infusions: . sodium chloride 75 mL/hr at 10/11/13 1536  . sodium chloride 20 mL/hr at 10/11/13 2057    PRN Medications: cloNIDine, hydrALAZINE, HYDROcodone-acetaminophen, LORazepam, ondansetron (ZOFRAN) IV, ondansetron, traZODone   Past Medical History  Diagnosis Date  . Impaired hearing   . Essential hypertension, benign   . Seizures     Dilantin  . Concussion 1993  . Arthritis   . Back pain   . Joint pain   . Joint swelling   . Urinary frequency   . Nocturia   . Anxiety     Past Surgical History  Procedure Laterality Date  . Inner ear surgery      Childhood   . Back surgery  1993  . Finger surgery  2003    Left ring finger    Family History  Problem Relation Age of Onset  . Anesthesia problems Neg Hx   . Hypotension Neg Hx   . Malignant hyperthermia Neg Hx   . Pseudochol deficiency Neg Hx     Social History Robert Walls reports that he has been smoking Cigarettes.  He has a 4 pack-year smoking history. He does not have any smokeless tobacco history on file. Robert Walls reports that he drinks alcohol.  Review of Systems Patient denies any chest pain or palpitations, has had no syncope. Recent seizures noted. Reports stable appetite. No bleeding problems. No dysphasia or odontophagia. Other systems reviewed and negative.  Physical Examination Blood pressure 186/105, pulse 80, temperature 98 F (36.7 C), temperature source Oral, resp. rate 20, height _0  (1.702 m), weight 155 lb 3.3 oz (70.4 kg), SpO2 100.00%.  Intake/Output Summary (Last 24 hours) at 10/12/13 0953 Last data filed at 10/12/13 0515  Gross per 24 hour  Intake 3812.25 ml  Output      0 ml  Net 3812.25 ml   Telemetry: Sinus rhythm.  Patient is in no distress. HEENT: Conjunctiva and lids normal, oropharynx clear with poor dentition. No dentures. Neck: Supple, no elevated JVP or carotid bruits, no thyromegaly. Lungs: Clear to auscultation, nonlabored breathing at rest. Cardiac: Regular rate and rhythm, no S3 or significant systolic murmur, no  pericardial rub. Abdomen: Soft, nontender, bowel sounds present. Extremities: No pitting edema, distal pulses 2+. Skin: Warm and dry. Musculoskeletal: No kyphosis. Neuropsychiatric: Alert and oriented x2, affect grossly appropriate.   Lab Results  Basic Metabolic Panel:  Recent Labs Lab 10/09/13 1516 10/10/13 0541 10/12/13 0550  NA 137 140 143  K 4.3 4.2 4.1  CL 99 104 102  CO2 _0 GLUCOSE 103* 90 102*  BUN 25* 21 15  CREATININE 1.72* 1.60* 1.38*  CALCIUM 8.9 9.0 9.6    Liver Function Tests:  Recent  Labs Lab 10/09/13 1516 10/10/13 0541  AST 21 20  ALT 20 19  ALKPHOS 69 66  BILITOT 0.5 0.5  PROT 7.4 7.1  ALBUMIN 3.8 3.7    CBC:  Recent Labs Lab 10/09/13 1516 10/10/13 0541 10/12/13 0550  WBC 7.8 6.8 9.2  NEUTROABS 4.6  --   --   HGB 13.9 13.5 15.7  HCT 40.4 39.3 45.4  MCV 96.0 96.3 95.4  PLT 197 180 225    Cardiac Enzymes:  Recent Labs Lab 10/09/13 1516  TROPONINI <0.30    ECG Sinus bradycardia with nonspecific T-wave changes.  Imaging Brain MRI IMPRESSION:  1. Small acute to subacute infarcts in the dorsal left thalamus  (near but not involving the left hippocampus), and superior left  cerebellum. These are compatible with recent left posterior fossa  small vessel ischemia. No associated mass effect or hemorrhage.  2. Superimposed small acute to subacute left insula infarct. This is  in the left MCA territory, and could reflect synchronous small  vessel disease or indicate recent Mild embolic event.  3. Nonspecific right temporal lobe encephalomalacia. Scattered  nonspecific cerebral and cerebellar white matter signal abnormality.  4. Intra and extracranial artery dolichoectasia.  5. Bilateral mastoid effusions, appear to be simple/sterile.   Impression  1. Stroke with evidence of multiple vascular distribution. TEE has been requested by Neurology to exclude cardioembolic source. Procedure was discussed with the patient and also his son Robert Walls by phone, he is in agreement to proceed. Scheduled for this afternoon in the endoscopy suite.  2. Hypertension, blood pressure remains elevated. Medications have been adjusted by the primary team. He is on neither atenolol nor clonidine which he had been on at home. Currently on Norvasc.  3. History of alcohol abuse.  4. History of seizures.   Recommendations  Keep patient n.p.o. except medications. TEE is scheduled for this afternoon.   Satira Sark, M.D., F.A.C.C.

## 2013-10-12 NOTE — CV Procedure (Signed)
Attempted transesophageal echocardiogram in the endoscopy suite. Cetacaine was used for oropharyngeal anesthesia. Patient was administered a total of Versed 9 mg, fentanyl 75 mcg, and Demerol 12.5 mg. Despite this, he was still not able to be adequately sedated for safe passage of the TEE probe - he could not cooperate with instructions, was pulling at the tube as well. I elected to abort the procedure and schedule it with Anesthesia tomorrow for use of propofol.  Jonelle SidleSamuel G. Hermen Mario, M.D., F.A.C.C.

## 2013-10-13 ENCOUNTER — Inpatient Hospital Stay (HOSPITAL_COMMUNITY): Payer: Medicaid Other | Admitting: Anesthesiology

## 2013-10-13 ENCOUNTER — Encounter (HOSPITAL_COMMUNITY): Payer: Self-pay | Admitting: *Deleted

## 2013-10-13 ENCOUNTER — Encounter (HOSPITAL_COMMUNITY)
Admission: EM | Disposition: A | Discharge status: Home / Self Care | Payer: Self-pay | Source: Home / Self Care | Attending: Family Medicine

## 2013-10-13 ENCOUNTER — Other Ambulatory Visit: Payer: Self-pay

## 2013-10-13 DIAGNOSIS — I639 Cerebral infarction, unspecified: Secondary | ICD-10-CM

## 2013-10-13 DIAGNOSIS — I6789 Other cerebrovascular disease: Secondary | ICD-10-CM

## 2013-10-13 DIAGNOSIS — F1011 Alcohol abuse, in remission: Secondary | ICD-10-CM

## 2013-10-13 HISTORY — PX: TEE WITHOUT CARDIOVERSION: SHX5443

## 2013-10-13 LAB — HEMOGLOBIN A1C
Hgb A1c MFr Bld: 5.3 % (ref <5.7)
MEAN PLASMA GLUCOSE: 105 mg/dL (ref <117)

## 2013-10-13 LAB — RPR

## 2013-10-13 LAB — HIV ANTIBODY (ROUTINE TESTING W REFLEX): HIV: NONREACTIVE

## 2013-10-13 LAB — AMMONIA: Ammonia: 31 umol/L (ref 11–60)

## 2013-10-13 LAB — VITAMIN B12: VITAMIN B 12: 428 pg/mL (ref 211–911)

## 2013-10-13 LAB — TSH: TSH: 4.15 u[IU]/mL (ref 0.350–4.500)

## 2013-10-13 SURGERY — ECHOCARDIOGRAM, TRANSESOPHAGEAL
Anesthesia: Monitor Anesthesia Care

## 2013-10-13 MED ORDER — SODIUM CHLORIDE BACTERIOSTATIC 0.9 % IJ SOLN
INTRAMUSCULAR | Status: AC
  Administered 2013-10-13: 20 mL
  Filled 2013-10-13: qty 10

## 2013-10-13 MED ORDER — MIDAZOLAM HCL 2 MG/2ML IJ SOLN
INTRAMUSCULAR | Status: AC
  Filled 2013-10-13: qty 2

## 2013-10-13 MED ORDER — ONDANSETRON HCL 4 MG/2ML IJ SOLN
INTRAMUSCULAR | Status: AC
  Filled 2013-10-13: qty 2

## 2013-10-13 MED ORDER — FENTANYL CITRATE 0.05 MG/ML IJ SOLN
INTRAMUSCULAR | Status: AC
  Filled 2013-10-13: qty 2

## 2013-10-13 MED ORDER — SODIUM CHLORIDE BACTERIOSTATIC 0.9 % IJ SOLN
20.0000 mL | Freq: Once | INTRAMUSCULAR | Status: DC
  Filled 2013-10-13: qty 20

## 2013-10-13 MED ORDER — SODIUM CHLORIDE BACTERIOSTATIC 0.9 % IJ SOLN
INTRAMUSCULAR | Status: AC
  Filled 2013-10-13: qty 10

## 2013-10-13 MED ORDER — FENTANYL CITRATE 0.05 MG/ML IJ SOLN: 25.0000 ug | INTRAMUSCULAR | Status: DC | PRN

## 2013-10-13 MED ORDER — ONDANSETRON HCL 4 MG/2ML IJ SOLN
4.0000 mg | Freq: Once | INTRAMUSCULAR | Status: AC
  Administered 2013-10-13: 4 mg via INTRAVENOUS

## 2013-10-13 MED ORDER — LIDOCAINE HCL (PF) 1 % IJ SOLN
INTRAMUSCULAR | Status: AC
  Filled 2013-10-13: qty 5

## 2013-10-13 MED ORDER — LOVASTATIN 20 MG PO TABS: 20.0000 mg | ORAL_TABLET | Freq: Every day | ORAL | Status: DC

## 2013-10-13 MED ORDER — LACTATED RINGERS IV SOLN
INTRAVENOUS | Status: DC
  Administered 2013-10-13: 08:00:00 via INTRAVENOUS

## 2013-10-13 MED ORDER — FENTANYL CITRATE 0.05 MG/ML IJ SOLN
25.0000 ug | INTRAMUSCULAR | Status: AC
  Administered 2013-10-13: 25 ug via INTRAVENOUS

## 2013-10-13 MED ORDER — MIDAZOLAM HCL 2 MG/2ML IJ SOLN
1.0000 mg | INTRAMUSCULAR | Status: DC | PRN
  Administered 2013-10-13: 2 mg via INTRAVENOUS

## 2013-10-13 MED ORDER — MIDAZOLAM HCL 5 MG/5ML IJ SOLN
INTRAMUSCULAR | Status: DC | PRN
  Administered 2013-10-13: 2 mg via INTRAVENOUS

## 2013-10-13 MED ORDER — ASPIRIN 81 MG PO TBEC: 81.0000 mg | DELAYED_RELEASE_TABLET | Freq: Every day | ORAL | Status: DC

## 2013-10-13 MED ORDER — PROPOFOL INFUSION 10 MG/ML OPTIME
INTRAVENOUS | Status: DC | PRN
  Administered 2013-10-13: 100 ug/kg/min via INTRAVENOUS

## 2013-10-13 MED ORDER — ASPIRIN EC 81 MG PO TBEC: 81.0000 mg | DELAYED_RELEASE_TABLET | Freq: Every day | ORAL | Status: DC

## 2013-10-13 MED ORDER — LACTATED RINGERS IV SOLN
INTRAVENOUS | Status: DC | PRN
  Administered 2013-10-13: 09:00:00 via INTRAVENOUS

## 2013-10-13 MED ORDER — FENTANYL CITRATE 0.05 MG/ML IJ SOLN
INTRAMUSCULAR | Status: DC | PRN
  Administered 2013-10-13 (×4): 25 ug via INTRAVENOUS

## 2013-10-13 MED ORDER — LEVETIRACETAM 500 MG PO TABS: 500.0000 mg | ORAL_TABLET | Freq: Two times a day (BID) | ORAL | Status: DC

## 2013-10-13 MED ORDER — PROPOFOL 10 MG/ML IV BOLUS
INTRAVENOUS | Status: AC
  Filled 2013-10-13: qty 20

## 2013-10-13 MED ORDER — ONDANSETRON HCL 4 MG/2ML IJ SOLN: 4.0000 mg | Freq: Once | INTRAMUSCULAR | Status: DC | PRN

## 2013-10-13 MED ORDER — HALOPERIDOL LACTATE 5 MG/ML IJ SOLN
2.0000 mg | Freq: Four times a day (QID) | INTRAMUSCULAR | Status: DC | PRN
  Administered 2013-10-13: 2 mg via INTRAVENOUS
  Filled 2013-10-13: qty 1

## 2013-10-13 NOTE — Anesthesia Postprocedure Evaluation (Signed)
  Anesthesia Post-op Note  Patient: Robert Walls  Procedure(s) Performed: Procedure(s): TRANSESOPHAGEAL ECHOCARDIOGRAM (TEE) WITH PROPOFOL (N/A)  Patient Location: PACU  Anesthesia Type:MAC  Level of Consciousness: sedated and patient cooperative  Airway and Oxygen Therapy: Patient Spontanous Breathing and Patient connected to face mask oxygen  Post-op Pain: none  Post-op Assessment: Post-op Vital signs reviewed, Patient's Cardiovascular Status Stable, Respiratory Function Stable, Patent Airway, No signs of Nausea or vomiting and Pain level controlled  Post-op Vital Signs: Reviewed and stable  Last Vitals:  Filed Vitals:   10/13/13 0731  BP: 172/99  Pulse: 79  Temp: 36.4 C  Resp: 19    Complications: No apparent anesthesia complications

## 2013-10-13 NOTE — Progress Notes (Signed)
PROGRESS NOTE  Robert CrowBryan K Walls UJW:[id number] DOB: 31-May-1955 DOA: 10/09/2013 PCP: No PCP Per Patient  Summary: 58 year old man who presented with history of several seizures witnessed by family. MRI revealed multiple infarcts. Seen in consultation with neurology and started on Keppra. He subsequently left but was escorted back by police. Currently TEE is planned for tomorrow to rule out embolic source. He remains encephalopathic.  According to chart review, [facility], referred to neurology as an outpatient. Patient reported being admitted for seizures at that time.   Assessment/Plan: 1. Acute encephalopathy/memory loss thought to be secondary to post ictal etiology. 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 Present for several weeks. Much improved today. 2. Small acute to subacute infarcts. No evidence of cardioembolic source on TEE. Discussed with cardiology and with neurology today, recommendation for low-dose aspirin. LDL 100. Bilateral carotid ultrasound unremarkable. 3. Seizure disorder. No seizure since admission. Continue Keppra per neurology. Discontinued tramadol. Right temporal encephalomalacia may be substrate. 4. Acute kidney injury. Resolved. Etiology unclear, possibly related to NSAIDs for dehydration. 5. Hypertension, likely rebound hypertension off of clonidine. Much improved with restarting of clonidine. 6. History of alcohol abuse/dependence. History of heavy alcohol use in the past per son. History of daily alcohol use 2-3 beers per day until approximately 3 weeks ago.  7. Urine drug screen positive for benzodiazepines and marijuana   Overall much improved. Encephalopathy much better. Plan discharge home today on low-dose aspirin, Keppra, followup with neurology in 4 weeks. He is ambulating without difficulty and eating without difficulty. No indication at this point for physical or occupational therapy evaluations  which would simply delay discharge.  Event monitor recommended on discharge. Coordinating with cardiology office.  B12, TSH, RPR, HIV, urine culture pending.  Followup urine culture.  Outpatient SLP  No driving, standard seizure precautions.  Discussed in detail with family at bedside including son, sister.  Robert Sacksaniel Naomee Nowland, MD  [facility]  Pager [phone] If 7PM-7AM, please contact night-coverage at [email or web address], password [email or web address] 10/13/2013, 12:04 PM  LOS: 4 days   Consultants:  Neurology  Cardiology  Procedures:  7/21 EEG: This is a normal recording of the awake and drowsy states.  7/23 aborted TEE, unable to sedate  7/24 TEE: "No evidence of intracardiac thrombus, no ASD or PFO visualized. There is atherosclerotic disease within the aorta as outlined above that does represent a potential source for embolic event."  HPI/Subjective: TEE completed, no cardioembolic source identified. Cardiology recommended antiplatelet therapy.  Confusion much improved. No new complaints. Ready to go home.  Objective: Filed Vitals:   10/13/13 0731 10/13/13 0910 10/13/13 0930 10/13/13 0945  BP: 172/99 120/72 123/71 122/70  Pulse: 79 49 54 59  Temp: 97.5 F (36.4 C) 98 F (36.7 C)    TempSrc: Oral     Resp: 19 18 16 15   Height:      Weight:      SpO2: 97% 100% 100% 99%    Intake/Output Summary (Last 24 hours) at 10/13/13 1204 Last data filed at 10/13/13 0930  Gross per 24 hour  Intake    840 ml  Output      0 ml  Net    840 ml     Filed Weights   10/09/13 1332 10/10/13 1102  Weight: 70.308 kg (155 lb) 70.4 kg (155 lb 3.3 oz)    Exam:     Afebrile, blood pressure better controlled. Vital stable. Gen. Appears calm and comfortable. Psych. Alert. Speech fluent and  clear. Oriented to self, location, month, year. Cardiovascular. Regular rate and rhythm. No murmur, rub or gallop. No lower extremity edema. Respiratory. Clear to auscultation bilaterally. No wheezes,  rales or rhonchi. Normal respiratory effort. Musculoskeletal. Moves all extremities well. Grossly normal tone and strength. Neurologic. Nonfocal.  Data Reviewed:  Chemistry: Serum ammonia level normal. Multiple other studies pending.   Scheduled Meds: . amLODipine  10 mg Oral Daily  . aspirin  325 mg Oral Daily  . Chlorhexidine Gluconate Cloth  6 each Topical Daily  . cloNIDine  0.3 mg Oral BID  . heparin  5,000 Units Subcutaneous 3 times per day  . levETIRAcetam  500 mg Oral BID  . mupirocin ointment  1 application Nasal BID  . nicotine  21 mg Transdermal Daily  . pneumococcal 23 valent vaccine  0.5 mL Intramuscular Tomorrow-1000  . simvastatin  20 mg Oral q1800   Continuous Infusions:    Principal Problem:   CVA (cerebral infarction) Active Problems:   Seizure   Alcohol abuse   HTN (hypertension)   Acute encephalopathy   Acute kidney injury   History of alcohol abuse

## 2013-10-13 NOTE — Progress Notes (Signed)
  Echocardiogram Echocardiogram Transesophageal has been performed.  Robert Walls 10/13/2013, 10:36 AM

## 2013-10-13 NOTE — Anesthesia Preprocedure Evaluation (Signed)
Anesthesia Evaluation  Patient identified by MRN, date of birth, ID band Patient awake  General Assessment Comment:Sleepy but appropriate this am.   Reviewed: Allergy & Precautions, H&P , NPO status , Patient's Chart, lab work & pertinent test results  Airway Mallampati: II TM Distance: >3 FB Neck ROM: Full    Dental  (+) Teeth Intact, Dental Advisory Given   Pulmonary Current Smoker,  breath sounds clear to auscultation        Cardiovascular hypertension, Pt. on medications Rhythm:Regular Rate:Normal     Neuro/Psych Seizures -, Well Controlled,  PSYCHIATRIC DISORDERS Anxiety    GI/Hepatic (+)     substance abuse  alcohol use,   Endo/Other    Renal/GU      Musculoskeletal   Abdominal   Peds  Hematology   Anesthesia Other Findings   Reproductive/Obstetrics                           Anesthesia Physical Anesthesia Plan  ASA: III  Anesthesia Plan: MAC   Post-op Pain Management:    Induction: Intravenous  Airway Management Planned: Simple Face Mask  Additional Equipment:   Intra-op Plan:   Post-operative Plan:   Informed Consent: I have reviewed the patients History and Physical, chart, labs and discussed the procedure including the risks, benefits and alternatives for the proposed anesthesia with the patient or authorized representative who has indicated his/her understanding and acceptance.     Plan Discussed with:   Anesthesia Plan Comments:         Anesthesia Quick Evaluation

## 2013-10-13 NOTE — Addendum Note (Signed)
Addendum created 10/13/13 [id number] by Earleen NewportAmy A Adams, CRNA   Modules edited: Anesthesia Medication Administration, Charges VN

## 2013-10-13 NOTE — Progress Notes (Unsigned)
Received request by front desk staff Faythe Gheeanya Jackson asking per Dr.[place]quah that 30 day event monitor be placed,dx:CVA. Pt was asked to come to cardiology office after hospital discharge today but forgot.They live in [place] and request that monitor be mailed by [employer] [facility]. Pt has no insurance and I am told he is applying for emergency medicaid in [place].I have placed order with [employer] and patient may be responsible for bill since he has no insurance

## 2013-10-13 NOTE — Evaluation (Signed)
Occupational Therapy Evaluation Patient Details Name: Einar CrowBryan K Guo MRN: [id number] DOB: 28-Aug-1955 Today's Date: 10/13/2013    History of Present Illness This is a 58 year old man who presents with altered mental status. According to the ER notes, family member had witnessed the patient had 2 seizures yesterday. The patient apparently has had seizures before and these were thought perhaps to do with alcohol withdrawal. The patient tells me that he drinks two 24 ounce cans of beer almost every night. He has not had any alcohol in the last 3 nights. This morning he was confused and still appears to be confused by his own admission, but thinks he is getting better. He is now being admitted for further investigation and management.  MRI brain shows small subacute to acute infarct in the dorsal left thalamus, and superior Left cerebellum.  Superimposed small acute to subacute left insular infarct.    Clinical Impression   Pt presenting to acute OT with above situation.  No ROM, strength, or sensation deficits were noted during evaluation. Pt is at baseline, independent functioning with ADL/IADL needs.  Pt and sister report no concerns for pt's ability to engage in ADLs/IADLs upon d/c home.  Pt had no displays of inappropriate behaviors during evaluation and verbalized no plans to elope from hospital again. Pt needs no further OT services at this time - pt to be discharged from acute OT services.    Follow Up Recommendations  No OT follow up    Equipment Recommendations  None recommended by OT    Recommendations for Other Services       Precautions / Restrictions Precautions Precautions: None Restrictions Weight Bearing Restrictions: No      Mobility Bed Mobility Overal bed mobility: Independent                Transfers Overall transfer level: Independent                    Balance Overall balance assessment: No apparent balance deficits (not formally assessed)  (Simultaneous filing. User may not have seen previous data.)                                    ADL Overall ADL's : At baseline;Modified independent                                       General ADL Comments: Encouraged pt to take extra time during ADL/IADL tasks     Vision  No visual deficits noted.  Pt reports no change from baseline.                   Perception     Praxis      Pertinent Vitals/Pain Pt denied pain     Hand Dominance Right (Simultaneous filing. User may not have seen previous data.)   Extremity/Trunk Assessment Upper Extremity Assessment Upper Extremity Assessment: Overall WFL for tasks assessed   Lower Extremity Assessment Lower Extremity Assessment: Overall WFL for tasks assessed       Communication Communication Communication: No difficulties (Simultaneous filing. User may not have seen previous data.)   Cognition Arousal/Alertness: Awake/alert Behavior During Therapy: WFL for tasks assessed/performed Overall Cognitive Status: Within Functional Limits for tasks assessed  General Comments       Exercises       Shoulder Instructions      Home Living Family/patient expects to be discharged to:: Private residence (Simultaneous filing. User may not have seen previous data.) Living Arrangements: Children (Lives with son Simultaneous filing. User may not have seen previous data.) Available Help at Discharge: Family;Available 24 hours/day (Simultaneous filing. User may not have seen previous data.) Type of Home: Mobile home (Simultaneous filing. User may not have seen previous data.) Home Access: Stairs to enter (Simultaneous filing. User may not have seen previous data.) Entrance Stairs-Number of Steps: 7 Entrance Stairs-Rails: Can reach both Home Layout: One level (Simultaneous filing. User may not have seen previous data.)     Bathroom Shower/Tub: Higher education careers adviser: Standard     Home Equipment: None   Additional Comments: Walk in shower  Lives With: Son (Simultaneous filing. User may not have seen previous data.)    Prior Functioning/Environment Level of Independence: Independent (Simultaneous filing. User may not have seen previous data.)             OT Diagnosis:     OT Problem List:     OT Treatment/Interventions:      OT Goals(Current goals can be found in the care plan section) Acute Rehab OT Goals Patient Stated Goal: no OT goals needed OT Goal Formulation: With patient  OT Frequency:     Barriers to D/C:            Co-evaluation PT/OT/SLP Co-Evaluation/Treatment: Yes Reason for Co-Treatment: Necessary to address cognition/behavior during functional activity (Per chart, pt has eloped from hospital and walked down rd for 4 miles, and has been asking NSG inappropriate questions regarding drugs) PT goals addressed during session: Mobility/safety with mobility;Balance;Strengthening/ROM OT goals addressed during session: ADL's and self-care;Strengthening/ROM;Other (comment)      End of Session Equipment Utilized During Treatment: Gait belt  Activity Tolerance: Patient tolerated treatment well Patient left: in bed;with call bell/phone within reach;with family/visitor present   Time: 1610-9604 OT Time Calculation (min): 18 min Charges:  OT General Charges $OT Visit: 1 Procedure OT Evaluation $Initial OT Evaluation Tier I: 1 Procedure G-Codes:     Marry Guan, MS, OTR/L [phone]  10/13/2013, 12:14 PM

## 2013-10-13 NOTE — Transfer of Care (Signed)
Immediate Anesthesia Transfer of Care Note  Patient: Robert Walls  Procedure(s) Performed: Procedure(s): TRANSESOPHAGEAL ECHOCARDIOGRAM (TEE) WITH PROPOFOL (N/A)  Patient Location: PACU  Anesthesia Type:MAC  Level of Consciousness: sedated and patient cooperative  Airway & Oxygen Therapy: Patient Spontanous Breathing and Patient connected to face mask oxygen  Post-op Assessment: Report given to PACU RN and Post -op Vital signs reviewed and stable  Post vital signs: Reviewed and stable  Complications: No apparent anesthesia complications

## 2013-10-13 NOTE — Progress Notes (Signed)
UR chart review completed.  

## 2013-10-13 NOTE — Evaluation (Signed)
Speech Language Pathology Evaluation Patient Details Name: Robert Walls MRN: [id number] DOB: 1955/06/25 Today's Date: 10/13/2013 Time: [id number] SLP Time Calculation (min): 27 min  Problem List:  Patient Active Problem List   Diagnosis Date Noted  . Acute encephalopathy 10/12/2013  . Acute kidney injury 10/12/2013  . History of alcohol abuse 10/12/2013  . CVA (cerebral infarction) 10/10/2013  . Altered mental status 10/09/2013  . Seizure 10/09/2013  . Alcohol abuse 10/09/2013  . HTN (hypertension) 10/09/2013  . Rotator cuff tear arthropathy of right shoulder 06/16/2011   Past Medical History:  Past Medical History  Diagnosis Date  . Impaired hearing   . Essential hypertension, benign   . Seizures     Dilantin  . Concussion 1993  . Arthritis   . Back pain   . Joint pain   . Joint swelling   . Urinary frequency   . Nocturia   . Anxiety    Past Surgical History:  Past Surgical History  Procedure Laterality Date  . Inner ear surgery      Childhood  . Back surgery  1993  . Finger surgery  2003    Left ring finger   HPI:  Pt is a 58 y.o. male who was admitted to hospital 7/20 for altered mental status; hx of seizures and had 2 seizures the day prior to admission. MRI on 7/20 revealed acute-subacute infarcts on L thalamus/ cerebellum and encephalomalacia of R temporal lobe. MD on 7/23 reported increased confusion compared to previous visits. Speech therapy eval ordered as part of stroke workup to further evaluate language/ cognition.   Assessment / Plan / Recommendation Clinical Impression  The pt's overall motor speech and receptive/ expressive language skills are intact for tasks assessed;  currently demonstrating difficulties with cognitive skills including orientation to time and situation, short-term memory, and problem solving. With min-mod verbal/ contextual cues success was achieved with these tasks. Pt's sisters were present in room and noted that his cognitive  status is much improved today compared to yesterday, and given this prognosis is excellent for continued improvement. Current cognitive status puts pt's safety at risk. Speech therapy will continue to follow while pt is in the hospital; pt would also likely benefit from short-term outpatient speech therapy to continue addressing these cognitive skills.     SLP Assessment  Patient needs continued Speech Lanaguage Pathology Services    Follow Up Recommendations  Outpatient SLP    Frequency and Duration min 2x/week  2 weeks   Pertinent Vitals/Pain n/a   SLP Goals  SLP Goals Potential to Achieve Goals: Good Progress/Goals/Alternative treatment plan discussed with pt/caregiver and they: Agree  SLP Evaluation Prior Functioning  Cognitive/Linguistic Baseline: Within functional limits Type of Home: House  Lives With: Son Available Help at Discharge: Family Vocation: Self employed   Cognition  Overall Cognitive Status: Impaired/Different from baseline Arousal/Alertness: Awake/alert Orientation Level: Oriented to person;Oriented to place;Disoriented to time;Disoriented to situation Attention: Selective Selective Attention: Appears intact Memory: Impaired Memory Impairment: Decreased recall of new information;Decreased short term memory Decreased Short Term Memory: Verbal complex;Functional complex Problem Solving: Impaired Problem Solving Impairment: Verbal complex Executive Function: Sequencing;Organizing Sequencing: Appears intact Organizing: Appears intact Safety/Judgment: Appears intact    Comprehension  Auditory Comprehension Overall Auditory Comprehension: Appears within functional limits for tasks assessed Yes/No Questions: Within Functional Limits Commands: Within Functional Limits Conversation: Complex Interfering Components: Hearing;Other (comment) (has had hearing impairment since childhood) EffectiveTechniques: Increased volume Reading Comprehension Reading  Status: Within funtional limits    Expression  Expression Primary Mode of Expression: Verbal Verbal Expression Overall Verbal Expression: Appears within functional limits for tasks assessed Initiation: No impairment Level of Generative/Spontaneous Verbalization: Conversation Repetition: No impairment Naming: No impairment Pragmatics: No impairment Non-Verbal Means of Communication: Not applicable   Oral / Motor Oral Motor/Sensory Function Overall Oral Motor/Sensory Function: Appears within functional limits for tasks assessed Motor Speech Overall Motor Speech: Appears within functional limits for tasks assessed   GO     Rebecca Eaton, Grayland Ormond, MA, CCC-SLP 10/13/2013, 11:34 AM

## 2013-10-13 NOTE — Progress Notes (Addendum)
Consulting cardiologist: Dr. Samuel G. McDowell  Subjective:   Patient seen in PACU prior to TEE. No new complaints.   Objective:   Temp:  [97.5 F (36.4 C)-98.2 F (36.8 C)] 97.5 F (36.4 C) (07/24 0731) Pulse Rate:  [51-101] 51 (07/24 0908) Resp:  [14-37] 19 (07/24 0731) BP: (1Jonelle Sidle44-192)/(80-119) 172/99 mmHg (07/24 0731) SpO2:  [97 %-100 %] 97 % (07/24 0731) Last BM Date: 10/10/13  Filed Weights   10/09/13 1332 10/10/13 1102  Weight: 155 lb (70.308 kg) 155 lb 3.3 oz (70.4 kg)    Intake/Output Summary (Last 24 hours) at 10/13/13 0909 Last data filed at 10/13/13 0905  Gross per 24 hour  Intake    640 ml  Output      0 ml  Net    640 ml    Telemetry: Sinus rhythm.  Exam:  General: Appears comfortable.  Lungs: Clear, nonlabored.  Cardiac: RRR, no gallop.  Extremities: No pitting edema.   Lab Results:  Basic Metabolic Panel:  Recent Labs Lab 10/09/13 1516 10/10/13 0541 10/12/13 0550  NA 137 140 143  K 4.3 4.2 4.1  CL 99 104 102  CO2 27 26 29   GLUCOSE 103* 90 102*  BUN 25* 21 15  CREATININE 1.72* 1.60* 1.38*  CALCIUM 8.9 9.0 9.6    Liver Function Tests:  Recent Labs Lab 10/09/13 1516 10/10/13 0541  AST 21 20  ALT 20 19  ALKPHOS 69 66  BILITOT 0.5 0.5  PROT 7.4 7.1  ALBUMIN 3.8 3.7    CBC:  Recent Labs Lab 10/09/13 1516 10/10/13 0541 10/12/13 0550  WBC 7.8 6.8 9.2  HGB 13.9 13.5 15.7  HCT 40.4 39.3 45.4  MCV 96.0 96.3 95.4  PLT 197 180 225    Cardiac Enzymes:  Recent Labs Lab 10/09/13 1516  TROPONINI <0.30    Coagulation:  Recent Labs Lab 10/10/13 0541  INR 1.07     Medications:   Scheduled Medications: . [MAR HOLD] amLODipine  10 mg Oral Daily  . [facility][MAR HOLD] aspirin  325 mg Oral Daily  . [MAR HOLD] Chlorhexidine Gluconate Cloth  6 each Topical Daily  . [facility][MAR HOLD] cloNIDine  0.3 mg Oral BID  . [MAR HOLD] heparin  5,000 Units Subcutaneous 3 times per day  . [facility][MAR HOLD] levETIRAcetam  500 mg Oral BID  . [facility][MAR  HOLD] mupirocin ointment  1 application Nasal BID  . [facility][MAR HOLD] nicotine  21 mg Transdermal Daily  . [facility][MAR HOLD] pneumococcal 23 valent vaccine  0.5 mL Intramuscular Tomorrow-1000  . [facility][MAR HOLD] simvastatin  20 mg Oral q1800  . sodium chloride bacteriostatic      . sodium chloride bacteriostatic         Infusions: . lactated ringers 75 mL/hr at 10/13/13 0819     PRN Medications:  [MAR HOLD] haloperidol lactate, [MAR HOLD] hydrALAZINE, [MAR HOLD] HYDROcodone-acetaminophen, [MAR HOLD] LORazepam, midazolam, [MAR HOLD] ondansetron (ZOFRAN) IV, [MAR HOLD] ondansetron, [MAR HOLD] traZODone   Assessment:   1. Stroke with evidence of multiple vascular distribution. TEE requested by Neurology to exclude cardioembolic source. Procedure aborted yesterday due to inability to adequately sedate the patient with standard measures. He underwent successful TEE in the PACU this morning with anesthesia directed propofol.   2. Hypertension, blood pressure remains elevated. Medications are being adjusted by the primary team.  3. History of alcohol abuse.   4. History of seizures.   Plan/Discussion:    TEE completed this morning in the PACU as outlined.  Please refer to full report. In summary, LVEF is normal, no major regurgitant valvular abnormalities noted, no evidence of intracardiac thrombus including evaluation of left and right atrial appendages, no evidence of ASD or PFO by color flow Doppler or agitated saline contrast. There is moderate atherosclerosis noted within the descending aorta which could be a potential contributor to increased stroke risk. Antiplatelet regimen would be indicated. No further cardiac testing planned at this time.   Jonelle Sidle, M.D., F.A.C.C.

## 2013-10-13 NOTE — Progress Notes (Signed)
PT Cancellation Note  Patient Details Name: Robert Walls MRN: [id number] DOB: [birth year]   Cancelled Treatment:    Reason Eval/Treat Not Completed: Patient at procedure or test/unavailable  Received order and chart reviewed.  Pt currently in pre-op for TEE.  Will complete evaluation as time allows.     Theon Sobotka 10/13/2013, 8:21 AM

## 2013-10-13 NOTE — Addendum Note (Signed)
Addendum created 10/13/13 [id number] by Earleen NewportAmy A Kylan Veach, CRNA   Modules edited: Anesthesia Medication Administration

## 2013-10-13 NOTE — Care Management Note (Signed)
    Page 1 of 1   10/13/2013     1:34:34 PM CARE MANAGEMENT NOTE 10/13/2013  Patient:  Robert Walls,Robert Walls   Account Number:  [id number]  Date Initiated:  10/10/2013  Documentation initiated by:  [facility]  Subjective/Objective Assessment:   Pt admitted from home with CVA and etoh abuse. Pt lives with his wife and will return home at discharge. Pt is independent with ADL's.     Action/Plan:   Financial counselor aware of self pay status. PCP appt [facility] and documented on AVS. MATCH voucher given for medication assistance. Pt for discharge today.   Anticipated DC Date:  10/13/2013   Anticipated DC Plan:  HOME/SELF CARE  In-house referral  Financial Counselor      DC Planning Services  CM consult  MATCH Program  PCP issues      Choice offered to / List presented to:             Status of service:  Completed, signed off Medicare Important Message given?   (If response is "NO", the following Medicare IM given date fields will be blank) Date Medicare IM given:   Medicare IM given by:   Date Additional Medicare IM given:   Additional Medicare IM given by:    Discharge Disposition:  HOME/SELF CARE  Per UR Regulation:  Reviewed for med. necessity/level of care/duration of stay  If discussed at Long Length of Stay Meetings, dates discussed:    Comments:  10/13/13 1330 Arlyss Queenammy Lamarkus Nebel, RN BSN CM

## 2013-10-13 NOTE — Progress Notes (Signed)
OT Cancellation Note  Patient Details Name: Robert Walls MRN: [id number] DOB: [birth year]   Cancelled Treatment:    Reason Eval/Treat Not Completed: Patient at procedure or test/ unavailable. Pt receiving TEE. Will re-attempt OT eval at later time/date.  Robert GuanMarie Rawlings, MS, OTR/L [phone]  10/13/2013, 8:24 AM

## 2013-10-13 NOTE — Evaluation (Signed)
Physical Therapy Evaluation Patient Details Name: Robert Walls MRN: [id number] DOB: 1956/01/09 Today's Date: 10/13/2013   History of Present Illness  This is a 58 year old man who presents with altered mental status. According to the ER notes, family member had witnessed the patient had 2 seizures yesterday. The patient apparently has had seizures before and these were thought perhaps to do with alcohol withdrawal. The patient tells me that he drinks two 24 ounce cans of beer almost every night. He has not had any alcohol in the last 3 nights. This morning he was confused and still appears to be confused by his own admission, but thinks he is getting better. He is now being admitted for further investigation and management.  MRI brain shows small subacute to acute infarct in the dorsal left thalamus, and superior Left cerebellum.  Superimposed small acute to subacute left insular infarct.   Clinical Impression  Pt is a 58 year old male who presents to physical therapy with dx of CVS; MRI reveals small subacute to acute infarct in the dorsal Lt thalamus and superior Lt cerebellum.  Pt AxO x4 during evaluation.  Pt reports he lives with his son who is available to assist as needed.  During evaluation, pt was (I) with bed mobility skills, transfers, and gait.  DGI performed with score of 24/24 without noted deviations in gait patterning during testing.  Pt is currently at baseline level of functioning without noted impairments in functional mobility skills; pt to be d/c from skilled PT services.  No follow up recommendations or DME requirements at this time.      Follow Up Recommendations No PT follow up    Equipment Recommendations  None recommended by PT       Precautions / Restrictions Precautions Precautions: None Restrictions Weight Bearing Restrictions: No      Mobility  Bed Mobility Overal bed mobility: Independent (Simultaneous filing. User may not have seen previous data.)                Transfers Overall transfer level: Independent (Simultaneous filing. User may not have seen previous data.)                  Ambulation/Gait Ambulation/Gait assistance: Independent Ambulation Distance (Feet): 450 Feet Assistive device: None Gait Pattern/deviations: WFL(Within Functional Limits)   Gait velocity interpretation: at or above normal speed for age/gender        Balance Overall balance assessment: No apparent balance deficits (not formally assessed) (Simultaneous filing. User may not have seen previous data.)                               Standardized Balance Assessment Standardized Balance Assessment :  (Assessed balance with eyes close in normal BOS and feet together without LOB or instability; able to maintain 15" each)   Dynamic Gait Index Level Surface: Normal Change in Gait Speed: Normal Gait with Horizontal Head Turns: Normal Gait with Vertical Head Turns: Normal (Noted decreased cervical ROM with looking up/down) Gait and Pivot Turn: Normal Step Over Obstacle: Normal Step Around Obstacles: Normal Steps: Normal Total Score: 24       Pertinent Vitals/Pain No pain reported.     Home Living Family/patient expects to be discharged to:: Private residence Living Arrangements: Children (Lives with son Simultaneous filing. User may not have seen previous data.) Available Help at Discharge: Family;Available 24 hours/day Type of Home: Mobile home Home Access: Stairs to enter  Entrance Stairs-Rails: Can reach both Entrance Stairs-Number of Steps: 7 Home Layout: One levelHome Equipment: None Additional Comments: Walk in shower    Prior Function Level of Independence: Independent              Hand Dominance   Dominant Hand: Right     Extremity/Trunk Assessment   Upper Extremity Assessment: Overall WFL for tasks assessed           Lower Extremity Assessment: Overall WFL for tasks assessed         Communication    Communication: No difficulties   Cognition Arousal/Alertness: Awake/alert Behavior During Therapy: WFL for tasks assessed/performed Overall Cognitive Status: Within Functional Limits for tasks assessed                        Assessment/Plan    PT Assessment Patent does not need any further PT services           PT Treatment Interventions     PT Goals (Current goals can be found in the Care Plan section) Acute Rehab PT Goals  PT Goal Formulation: No goals set, d/c therapy            Co-evaluation PT/OT/SLP Co-Evaluation/Treatment: Yes Reason for Co-Treatment: Necessary to address cognition/behavior during functional activity (Per chart, pt has eloped from hospital and walked down rd for 4 miles, and has been asking NSG inappropriate questions regarding drugs) PT goals addressed during session: Mobility/safety with mobility;Balance;Strengthening/ROM OT goals addressed during session: ADL's and self-care;Strengthening/ROM;Other (comment)       End of Session Equipment Utilized During Treatment: Gait belt Activity Tolerance: Patient tolerated treatment well Patient left: in bed;with call bell/phone within reach;with family/visitor present Nurse Communication: Mobility status         Time: 1610-9604 PT Time Calculation (min): 18 min   Charges:   PT Evaluation $Initial PT Evaluation Tier I: 1 Procedure             Cynthya Yam 10/13/2013, 12:15 PM

## 2013-10-13 NOTE — Discharge Summary (Addendum)
Physician Discharge Summary  Robert Walls:[id number] DOB: Oct 22, 1955 DOA: 10/09/2013  PCP: No PCP Per Patient primary care physician will be arranged by case management prior to discharge.  Admit date: 10/09/2013 Discharge date: 10/13/2013  Recommendations for Outpatient Follow-up:  1. Followup blood pressure, cholesterol, secondary risk reduction. 2. Followup seizures, started on Keppra. 3. Followup results of cardiac event monitor as an outpatient. 4. Followup pending laboratory studies: B12, TSH, RPR, HIV, urine culture. 5. Outpatient SLP 6. Followup hearing loss and bleeding from right ear canal 2 weeks prior to admission.  Follow-up Information   Follow up with [facility], KOFI, MD In 1 month.   Specialty:  Neurology   Contact information:   [address] [place] [address] [phone]       Follow up On 10/20/2013. (at 10:30)    Contact information:   [facility] [place] [address] [place], [place] [phone]      Follow up with Darletta Moll, MD On 11/02/2013. (3:30 PM)    Specialty:  Otolaryngology   Contact information:   [address] [place] [address] [phone]      Discharge Diagnoses:  1. Acute encephalopathy likely secondary to seizure and stroke 2. Small acute to subacute cerebral infarcts 3. Seizure disorder 4. Acute kidney injury 5. Hypertension 6. History of alcohol abuse/dependence 7. Urine drug screen positive for benzodiazepines, marijuana  Discharge Condition: Improved Disposition: Home  Diet recommendation: Heart healthy  Filed Weights   10/09/13 1332 10/10/13 1102  Weight: 70.308 kg (155 lb) 70.4 kg (155 lb 3.3 oz)    History of present illness:  58 year old man who presented with history of several seizures witnessed by family as well as encephalopathy for several weeks. MRI revealed multiple infarcts.   Hospital Course:  Patient was admitted for further evaluation of strokes and encephalopathy, seen in  consultation with neurology and started on Keppra empirically. He had no further seizures. He did have confusion during the early part of his hospitalization which gradually improved. On discharge he is alert and oriented. Encephalopathy thought to be postictal in nature. He underwent full stroke evaluation which was generally on revealing, TEE unremarkable except for atherosclerosis of the aorta, discussed with cardiologist interpreting physician Dr. Diona Browner who recommend antiplatelet therapy for this, no anticoagulation. He was evaluated by therapy with recommendations for outpatient speech language therapy. 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 Exam on discharge there is some dried blood in the ear canal the visual portion of the tympanic membrane appears unremarkable but exam quite limited. He has had no recent bleeding. Review of his records from [place], CT head 7/15 demonstrated Chronic opacification of the mastoid air cells.   1. Acute encephalopathy/memory loss thought to be secondary to post ictal etiology. 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 Present for several weeks. Much improved  on discharge  2. Small acute to subacute infarcts. No evidence of cardioembolic source on TEE. Discussed with cardiology and with neurology  date discharge,  recommendation for low-dose aspirin. LDL 100. Bilateral carotid ultrasound unremarkable.Not a candidate for TPA on admission secondary to delay in presentation.  3. Seizure disorder. No seizure since admission. Continue Keppra per neurology. Discontinued tramadol. Right temporal encephalomalacia may be substrate. Discussed with neurology recommends low-dose aspirin only. 4. Acute kidney injury. Resolved. Etiology unclear, possibly related to NSAIDs for dehydration. 5. Hypertension, likely rebound hypertension off of  clonidine. Much improved with  restarting of clonidine. 6. History of alcohol abuse/dependence. History of heavy alcohol use in the past per son. History of daily alcohol use 2-3 beers per day until approximately 3 weeks ago.  7. Urine drug screen positive for benzodiazepines and marijuana 8. Bleeding from right ear. Occurred prior to admission. No recurrence. CT and MRI findings noted sterile collection of fluid bilaterally, this is old per CT report from outside hospital. No acute symptoms. Outpatient followup has been arranged with ENT.  Event monitor recommended on discharge. Coordinating with cardiology office, patient will pick up on the way out. B12, TSH, RPR, HIV, urine culture pending.  Followup urine culture.  Outpatient SLP  No driving, standard seizure precautions.  Discussed in detail with family at bedside including son, sister.  Consultants:  Neurology  Cardiology Procedures:  7/21 EEG: This is a normal recording of the awake and drowsy states.  7/23 aborted TEE, unable to sedate  7/24 TEE: "No evidence of intracardiac thrombus, no ASD or PFO visualized. There is atherosclerotic disease within the aorta as outlined above that does represent a potential source for embolic event."  Discharge Instructions  Discharge Instructions   Diet - low sodium heart healthy    Complete by:  As directed      Discharge instructions    Complete by:  As directed   Call your physician or seek immediate medical assistance for recurrent seizures, worsening confusion, weakness, difficulty speaking or signs of stroke. Be sure to followup with primary care physician as soon as possible. Be sure to take aspirin (for stroke), Keppra (for seizures), and Lovastatin (cholesterol). Followup with neurology in 4 weeks. Neurology has recommended an event monitor to monitor for heart rhythm problems over the next month. You can pick this up at the cardiology office on the first floor after discharge. Speech pathologist has recommended  outpatient treatment to help you recover from stroke. Absolutely no driving, work at [employer], ladderes, open water swimming or activities that would put you or another person in danger if you were to have a seizure. No alcohol or illegal drugs.     Increase activity slowly    Complete by:  As directed             Medication List    STOP taking these medications       GOODY HEADACHE PO     traMADol 50 MG tablet  Commonly known as:  ULTRAM      TAKE these medications       aspirin 81 MG EC tablet  Take 1 tablet (81 mg total) by mouth daily.  Start taking on:  10/14/2013     cloNIDine 0.3 MG tablet  Commonly known as:  CATAPRES  Take 0.3 mg by mouth 2 (two) times daily.     levETIRAcetam 500 MG tablet  Commonly known as:  KEPPRA  Take 1 tablet (500 mg total) by mouth 2 (two) times daily.     lovastatin 20 MG tablet  Commonly known as:  MEVACOR  Take 1 tablet (20 mg total) by mouth at bedtime.       Allergies  Allergen Reactions  . Lisinopril Other (See Comments)    Depleted sodium; resulted in hospitalization, with the HCTZ    The results of significant diagnostics from this hospitalization (including imaging, microbiology, ancillary and laboratory) are listed below for reference.    Significant Diagnostic Studies: Ct Head Wo Contrast  10/09/2013   CLINICAL DATA:  Seizures.  Memory loss. Hit head during a seizure with bleeding from right ear. Neck pain, headache, and lightheadedness.  EXAM: CT HEAD WITHOUT CONTRAST  CT TEMPORAL BONES WITHOUT CONTRAST  CT CERVICAL SPINE WITHOUT CONTRAST  TECHNIQUE: Multidetector CT imaging of the head, temporal bones, and cervical spine were performed using the standard protocol without intravenous contrast. Multiplanar CT image reconstructions of the cervical spine and temporal bones were also generated.  COMPARISON:  None.  FINDINGS: CT HEAD FINDINGS  There is no evidence of acute cortical infarct, intracranial hemorrhage, mass, midline  shift, or extra-axial fluid collection. There is mild cerebral atrophy. Patchy periventricular white matter hypoattenuation is nonspecific but is compatible with minimal chronic small vessel ischemic disease. The visualized orbits are unremarkable. Visualized paranasal sinuses are clear. There are bilateral mastoid effusions.  CT TEMPORAL BONES FINDINGS  RIGHT: A small amount of debris is present in the external auditory canal. Superior portion of the tympanic membrane is not well seen. There is a moderate amount of fluid/ soft tissue throughout the tympanic cavity. The malleus and incus appear intact. The stapes is partially obscured by fluid/soft tissue. The internal auditory canal, cochlea, vestibule, and semicircular canals are unremarkable. The mastoid air cells are completely opacified. There are areas of osseous thinning involving the tegmen mastoideum, and dehiscence cannot be excluded.  LEFT: Focal soft tissue thickening/ cerumen is noted along the superior aspect of the external auditory canal. Tympanic membrane does not appear thickened, with portions of it are not well seen. There is a small amount of fluid/soft tissue in the tympanic cavity, predominantly in the epitympanum. The ossicles appear intact. The ossicles appear intact. The tympanic cavity is clear. The internal auditory canal, cochlea, vestibule, and semicircular canals are unremarkable. There is complete opacification of the mastoid air cells. There are areas of osseous thinning involving the tegmen mastoideum, and dehiscence cannot be excluded.  CT CERVICAL SPINE FINDINGS  Vertebral alignment is normal. Vertebral body heights are preserved without compression fracture. Prevertebral soft tissues are within normal limits. Moderate anterior osteophyte formation is present at C5-6 and C6-7. Mild disc space narrowing is noted at these two levels, with small amount of vacuum disc phenomenon present at C5-6. Moderate osseous overgrowth is noted at  the atlantodental articulation. No acute cervical spine fracture is identified. Mild to moderate facet arthrosis is noted in the mid to upper cervical spine. Mild biapical pleural thickening is partially visualized.  IMPRESSION: 1. No evidence of acute intracranial abnormality. 2. Complete opacification of the mastoid air cells bilaterally with partial opacification of the right greater than left middle year. No temporal bone fracture is identified, and findings may reflect otomastoiditis. 3. No acute osseous abnormality identified in the cervical spine.   Electronically Signed   By: Sebastian Ache   On: 10/09/2013 16:26   Mr Brain Wo Contrast  10/09/2013   CLINICAL DATA:  58 year old male with recent seizure activity. Headache and memory loss. Initial encounter.  EXAM: MRI HEAD WITHOUT CONTRAST  TECHNIQUE: Multiplanar, multiecho pulse sequences of the brain and surrounding structures were obtained without intravenous contrast.  COMPARISON:  Head, cervical spine and temporal bone CT without contrast 10/09/2013.  FINDINGS: Small curvilinear focus of restricted diffusion in the dorsal left thalamus very near but not definitely affecting the tail of the left hippocampus (series 100, image 17 and series 101, image 18). No hippocampal complex restricted diffusion. Small left of midline linear superior cerebellar artery region focus of restricted diffusion (series 100, image 12 and series 101, image  16).  Furthermore there is a subtle area of left insula diffusion restriction suspected on series 100, image 18.  No right hemisphere diffusion restriction. Major intracranial vascular flow voids are preserved, with some intracranial artery dolichoectasia.  Bilateral mastoid effusions, with facilitated diffusion. Mild paranasal sinus inflammatory changes. Negative visualized nasopharynx. Visualized orbit soft tissues are within normal limits.  Chronic T2 and FLAIR hyperintensity in the right cerebellar ganglia (series 7,  image 6). Scattered nonspecific cerebral white matter T2 and FLAIR hyperintensity, most pronounced in the anterior frontal lobes and greater on the left. There is also fairly extensive chronic encephalomalacia of the inferior right temporal lobe at the fusiform gyrus (series 10, image 25). T2 signal in the hippocampal complexes is within normal limits and symmetric.  No acute or chronic intracranial hemorrhage identified. No midline shift, mass effect, or evidence of intracranial mass lesion. No ventriculomegaly. Negative pituitary. Negative cervicomedullary junction and visualized cervical spine.  Normal bone marrow signal. Visualized scalp soft tissues are within normal limits. Tortuous cervical carotid arteries.  IMPRESSION: 1. 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 These are compatible with recent left posterior fossa small vessel ischemia. No associated mass effect or hemorrhage. 2. Superimposed small acute to subacute left insula infarct. This is in the left MCA territory, and could reflect synchronous small vessel disease or indicate recent Mild embolic event. 3. Nonspecific right temporal lobe encephalomalacia. Scattered nonspecific cerebral and cerebellar white matter signal abnormality. 4. Intra and extracranial artery dolichoectasia. 5. Bilateral mastoid effusions, appear to be simple/sterile.   Electronically Signed   By: Augusto Gamble M.D.   On: 10/09/2013 18:52   US Carotid Bilateral  10/10/2013   CLINICAL DATA:  Stroke  EXAM: BILATERAL CAROTID DUPLEX ULTRASOUND  TECHNIQUE: Wallace Cullens scale imaging, color Doppler and duplex ultrasound were performed of bilateral carotid and vertebral arteries in the neck.  COMPARISON:  None.  FINDINGS: Criteria: Quantification of carotid stenosis is based on velocity parameters that correlate the residual internal carotid diameter with NASCET-based stenosis levels, using the  diameter of the distal internal carotid lumen as the denominator for stenosis measurement.  The following velocity measurements were obtained:  RIGHT  ICA:  74 cm/sec  CCA:  74 cm/sec  SYSTOLIC ICA/CCA RATIO:  1.0  DIASTOLIC ICA/CCA RATIO:  ECA:  78 cm/sec  LEFT  ICA:  70 cm/sec  CCA:  54 cm/sec  SYSTOLIC ICA/CCA RATIO:  1.3  DIASTOLIC ICA/CCA RATIO:  ECA:  80 cm/sec  RIGHT CAROTID ARTERY: Mild calcified plaque in the lower internal carotid. Minimal intimal thickening in the bulb. Low resistance internal carotid Doppler pattern.  RIGHT VERTEBRAL ARTERY:  Antegrade.  Low resistance Doppler pattern.  LEFT CAROTID ARTERY: Minimal scattered calcified plaque in the bulb. Low resistance internal carotid Doppler pattern. Mild calcified plaque in the lower internal carotid.  LEFT VERTEBRAL ARTERY:  Antegrade.  Normal Doppler pattern.  IMPRESSION: Less than 50% stenosis in the right and left internal carotid arteries.   Electronically Signed   By: Maryclare Bean M.D.   On: 10/10/2013 09:28    Microbiology: Recent Results (from the past 240 hour(s))  SURGICAL PCR SCREEN     Status: Abnormal   Collection Time    10/12/13  5:37 AM      Result Value Ref Range Status   MRSA, PCR NEGATIVE  NEGATIVE Final   Staphylococcus aureus POSITIVE (*) NEGATIVE Final   Comment:  The Xpert SA Assay (FDA     approved for NASAL specimens     in patients over 56 years of age),     is one component of     a comprehensive surveillance     program.  Test performance has     been validated by [facility] for patients greater     than or equal to 56 year old.     It is not intended     to diagnose infection nor to     guide or monitor treatment.     RESULT CALLED TO, READ BACK BY AND VERIFIED WITH:     ABBIE GORDON RN ON 0723 AT 0850 BY RESSEGGER R     Labs: Basic Metabolic Panel:  Recent Labs Lab 10/09/13 1516 10/10/13 0541 10/12/13 0550  NA 137 140 143  K 4.3 4.2 4.1  CL 99 104 102  CO2 27 26 29     GLUCOSE 103* 90 102*  BUN 25* 21 15  CREATININE 1.72* 1.60* 1.38*  CALCIUM 8.9 9.0 9.6   Liver Function Tests:  Recent Labs Lab 10/09/13 1516 10/10/13 0541  AST 21 20  ALT 20 19  ALKPHOS 69 66  BILITOT 0.5 0.5  PROT 7.4 7.1  ALBUMIN 3.8 3.7    Recent Labs Lab 10/13/13 0610  AMMONIA 31   CBC:  Recent Labs Lab 10/09/13 1516 10/10/13 0541 10/12/13 0550  WBC 7.8 6.8 9.2  NEUTROABS 4.6  --   --   HGB 13.9 13.5 15.7  HCT 40.4 39.3 45.4  MCV 96.0 96.3 95.4  PLT 197 180 225   Cardiac Enzymes:  Recent Labs Lab 10/09/13 1516  TROPONINI <0.30    Principal Problem:   CVA (cerebral infarction) Active Problems:   Seizure   Alcohol abuse   HTN (hypertension)   Acute encephalopathy   Acute kidney injury   History of alcohol abuse   Time coordinating discharge: 45 minutes  Signed:  Brendia Sacksaniel Goodrich, MD [facility] 10/13/2013, 1:31 PM

## 2013-10-13 NOTE — CV Procedure (Signed)
TEE performed in PACU with sedation provided by Anesthesia service. There were no complications. Please refer to full TEE report.  In summary, LVEF is normal, no major regurgitant valvular abnormalities noted, no evidence of intracardiac thrombus including evaluation of left and right atrial appendages, no evidence of ASD or PFO by color flow Doppler or agitated saline contrast. There is moderate atherosclerosis noted within the descending aorta which could be a potential contributor to increased stroke risk.  Jonelle SidleSamuel G. Trystan Eads, M.D., F.A.C.C.

## 2013-10-18 DIAGNOSIS — I6350 Cerebral infarction due to unspecified occlusion or stenosis of unspecified cerebral artery: Secondary | ICD-10-CM

## 2013-10-27 ENCOUNTER — Encounter: Payer: Self-pay | Admitting: Adult Health

## 2013-10-27 ENCOUNTER — Ambulatory Visit (INDEPENDENT_AMBULATORY_CARE_PROVIDER_SITE_OTHER): Payer: Self-pay | Admitting: Adult Health

## 2013-10-27 VITALS — BP 166/98 | HR 61 | Ht 67.0 in | Wt 156.0 lb

## 2013-10-27 DIAGNOSIS — I10 Essential (primary) hypertension: Secondary | ICD-10-CM

## 2013-10-27 DIAGNOSIS — F1010 Alcohol abuse, uncomplicated: Secondary | ICD-10-CM

## 2013-10-27 NOTE — Assessment & Plan Note (Signed)
He has not had any alcohol since discharge. He states he does not plan to restart drinking alcohol

## 2013-10-27 NOTE — Progress Notes (Signed)
HPI: [name] is a 58 year old male patient of Dr. Diona BrownerMcDowell we are following status post hospitalization after admitted with seizures and encephalopathy. The patient also had an MRI revealing multiple infarcts. The patient had a TEE, per Dr. Diona BrownerMcDowell was found to be unremarkabl, no evidence of intracardiac thrombus, no ASD, or PFO visualized. There was noted atherosclerosis of the aorta that could represent a potential source for embolic events. with recommendation for low-dose aspirin.   He was also found to be very hypertensive on admission, which was likely related to rebound hypertension after stopping the clonidine. The patient's blood pressure is much improved after restarting clonidine. The patient was continued on medication regimen to include seizure medications. He was advised not to drive, and ask to abstain from alcohol and illegal drugs, as he has a history of alcoholism use of marijuana and benzodiazepines. He was taken off of Goody Powders.  He comes today "feeling like a different person". He states that he has had no further episodes of confusion, he has had no seizure activity. He currently lives with his brother and son. His son makes sure he takes his medicines each day. He has stopped drinking, and continues to wear a 30 day cardiac monitor. The patient states he has had some episodes of heart racing, but they occurred after he takes the monitor off when it gave an "error message. "  His only complaint today is tinnitus since leaving the hospital. He unfortunately continues to smoke and has found it difficult to quit secondary to chronic anxiety. He does not have a primary care physician.  Allergies  Allergen Reactions  . Lisinopril Other (See Comments)    Depleted sodium; resulted in hospitalization, with the HCTZ     Current Outpatient Prescriptions  Medication Sig Dispense Refill  . aspirin EC 81 MG EC tablet Take 1 tablet (81 mg total) by mouth daily.      . cloNIDine  (CATAPRES) 0.3 MG tablet Take 0.3 mg by mouth 2 (two) times daily.      Marland Kitchen. levETIRAcetam (KEPPRA) 500 MG tablet Take 1 tablet (500 mg total) by mouth 2 (two) times daily.  60 tablet  1  . lovastatin (MEVACOR) 20 MG tablet Take 1 tablet (20 mg total) by mouth at bedtime.  30 tablet  0   No current facility-administered medications for this visit.    Past Medical History  Diagnosis Date  . Impaired hearing   . Essential hypertension, benign   . Seizures     Dilantin  . Concussion 1993  . Arthritis   . Back pain   . Joint pain   . Joint swelling   . Urinary frequency   . Nocturia   . Anxiety     Past Surgical History  Procedure Laterality Date  . Inner ear surgery      Childhood  . Back surgery  1993  . Finger surgery  2003    Left ring finger  . Tee without cardioversion N/A 10/13/2013    Procedure: TRANSESOPHAGEAL ECHOCARDIOGRAM (TEE) WITH PROPOFOL;  Surgeon: Jonelle SidleSamuel G McDowell, MD;  Location: [facility];  Service: Cardiovascular;  Laterality: N/A;    ROS: Review of systems complete and found to be negative unless listed above  PHYSICAL EXAM BP 166/98  Pulse 61  Ht 5\' 7"  (1.702 m)  Wt 156 lb (70.761 kg)  BMI 24.43 kg/m2 General: Well developed, well nourished, in no acute distress Head: Eyes PERRLA, No xanthomas.   Normal cephalic and  atramatic  Lungs: Clear bilaterally to auscultation and percussion. Heart: HRRR S1 S2, without MRG.  Pulses are 2+ & equal. Wearing cardiac monitor.            No carotid bruit. No JVD.  No abdominal bruits. No femoral bruits. Abdomen: Bowel sounds are positive, abdomen soft and non-tender without masses or                  Hernia's noted. Msk:  Back normal, normal gait. Normal strength and tone for age. Extremities: No clubbing, cyanosis or edema.  DP +1 Neuro: Alert and oriented X 3. Psych:  Good affect, responds appropriately   ASSESSMENT AND PLAN

## 2013-10-27 NOTE — Assessment & Plan Note (Addendum)
I have rechecked his blood pressure in the exam room manually. It is now 148/88. I have given him a free blood pressure monitor, and recording sheet. I have asked him to take his blood pressure daily and record, bringing with him his records to followup appointment. I will not make any changes in his medication regimen at this time. It is noted that he is unable to take lisinopril HCTZ as he was found to be hyponatremic on this medication.  He will continue to wear his cardiac monitor for another 2 weeks. [place]  [place], with Dr. Diona BrownerMcDowell

## 2013-10-27 NOTE — Assessment & Plan Note (Signed)
He remains on aspirin only. He is having complaints of tinnitus, which I have reassured him may subside once his body gait she is taking aspirin daily. He is on baby aspirin only. He is not on anticoagulation at this time.

## 2013-10-27 NOTE — Progress Notes (Deleted)
Name: Robert Walls    DOB: 1955/10/30  Age: 58 y.o.  MR#: [id number]       PCP:  No PCP Per Patient      Insurance: Payor: MEDICAID POTENTIAL / Plan: MEDICAID POTENTIAL / Product Type: *No Product type* /   CC:    Chief Complaint  Patient presents with  . Hypertension    VS Filed Vitals:   10/27/13 1508  BP: 166/98  Pulse: 61  Height: 5\' 7"  (1.702 m)  Weight: 156 lb (70.761 kg)    Weights Current Weight  10/27/13 156 lb (70.761 kg)  10/10/13 155 lb 3.3 oz (70.4 kg)  10/10/13 155 lb 3.3 oz (70.4 kg)    Blood Pressure  BP Readings from Last 3 Encounters:  10/27/13 166/98  10/13/13 160/87  10/13/13 160/87     Admit date:  (Not on file) Last encounter with RMR:  Visit date not found   Allergy Lisinopril  Current Outpatient Prescriptions  Medication Sig Dispense Refill  . aspirin EC 81 MG EC tablet Take 1 tablet (81 mg total) by mouth daily.      . cloNIDine (CATAPRES) 0.3 MG tablet Take 0.3 mg by mouth 2 (two) times daily.      Marland Kitchen levETIRAcetam (KEPPRA) 500 MG tablet Take 1 tablet (500 mg total) by mouth 2 (two) times daily.  60 tablet  1  . lovastatin (MEVACOR) 20 MG tablet Take 1 tablet (20 mg total) by mouth at bedtime.  30 tablet  0   No current facility-administered medications for this visit.    Discontinued Meds:   There are no discontinued medications.  Patient Active Problem List   Diagnosis Date Noted  . Acute encephalopathy 10/12/2013  . Acute kidney injury 10/12/2013  . History of alcohol abuse 10/12/2013  . CVA (cerebral infarction) 10/10/2013  . Altered mental status 10/09/2013  . Seizure 10/09/2013  . Alcohol abuse 10/09/2013  . HTN (hypertension) 10/09/2013  . Rotator cuff tear arthropathy of right shoulder 06/16/2011    LABS    Component Value Date/Time   NA 143 10/12/2013 0550   NA 140 10/10/2013 0541   NA 137 10/09/2013 1516   K 4.1 10/12/2013 0550   K 4.2 10/10/2013 0541   K 4.3 10/09/2013 1516   CL 102 10/12/2013 0550   CL 104 10/10/2013  0541   CL 99 10/09/2013 1516   CO2 29 10/12/2013 0550   CO2 26 10/10/2013 0541   CO2 27 10/09/2013 1516   GLUCOSE 102* 10/12/2013 0550   GLUCOSE 90 10/10/2013 0541   GLUCOSE 103* 10/09/2013 1516   BUN 15 10/12/2013 0550   BUN 21 10/10/2013 0541   BUN 25* 10/09/2013 1516   CREATININE 1.38* 10/12/2013 0550   CREATININE 1.60* 10/10/2013 0541   CREATININE 1.72* 10/09/2013 1516   CALCIUM 9.6 10/12/2013 0550   CALCIUM 9.0 10/10/2013 0541   CALCIUM 8.9 10/09/2013 1516   GFRNONAA 56* 10/12/2013 0550   GFRNONAA 47* 10/10/2013 0541   GFRNONAA 43* 10/09/2013 1516   GFRAA 65* 10/12/2013 0550   GFRAA 54* 10/10/2013 0541   GFRAA 49* 10/09/2013 1516   CMP     Component Value Date/Time   NA 143 10/12/2013 0550   K 4.1 10/12/2013 0550   CL 102 10/12/2013 0550   CO2 29 10/12/2013 0550   GLUCOSE 102* 10/12/2013 0550   BUN 15 10/12/2013 0550   CREATININE 1.38* 10/12/2013 0550   CALCIUM 9.6 10/12/2013 0550   PROT 7.1 10/10/2013 0541  ALBUMIN 3.7 10/10/2013 0541   AST 20 10/10/2013 0541   ALT 19 10/10/2013 0541   ALKPHOS 66 10/10/2013 0541   BILITOT 0.5 10/10/2013 0541   GFRNONAA 56* 10/12/2013 0550   GFRAA 65* 10/12/2013 0550       Component Value Date/Time   WBC 9.2 10/12/2013 0550   WBC 6.8 10/10/2013 0541   WBC 7.8 10/09/2013 1516   HGB 15.7 10/12/2013 0550   HGB 13.5 10/10/2013 0541   HGB 13.9 10/09/2013 1516   HCT 45.4 10/12/2013 0550   HCT 39.3 10/10/2013 0541   HCT 40.4 10/09/2013 1516   MCV 95.4 10/12/2013 0550   MCV 96.3 10/10/2013 0541   MCV 96.0 10/09/2013 1516    Lipid Panel     Component Value Date/Time   CHOL 179 10/10/2013 0541   TRIG 132 10/10/2013 0541   HDL 53 10/10/2013 0541   CHOLHDL 3.4 10/10/2013 0541   VLDL 26 10/10/2013 0541   LDLCALC 100* 10/10/2013 0541    ABG No results found for this basename: phart, pco2, pco2art, po2, po2art, hco3, tco2, acidbasedef, o2sat     Lab Results  Component Value Date   TSH 4.150 10/13/2013   BNP (last 3 results) No results found for this basename: PROBNP,   in the last 8760 hours Cardiac Panel (last 3 results) No results found for this basename: CKTOTAL, CKMB, TROPONINI, RELINDX,  in the last 72 hours  Iron/TIBC/Ferritin/ %Sat No results found for this basename: iron, tibc, ferritin, ironpctsat     EKG Orders placed in visit on 10/13/13  . CARDIAC EVENT MONITOR     Prior Assessment and Plan Problem List as of 10/27/2013     Cardiovascular and Mediastinum   HTN (hypertension)     Nervous and Auditory   CVA (cerebral infarction)   Acute encephalopathy     Musculoskeletal and Integument   Rotator cuff tear arthropathy of right shoulder     Genitourinary   Acute kidney injury     Other   Altered mental status   Seizure   Alcohol abuse   History of alcohol abuse       Imaging: Ct Head Wo Contrast  10/09/2013   CLINICAL DATA:  Seizures. Memory loss. Hit head during a seizure with bleeding from right ear. Neck pain, headache, and lightheadedness.  EXAM: CT HEAD WITHOUT CONTRAST  CT TEMPORAL BONES WITHOUT CONTRAST  CT CERVICAL SPINE WITHOUT CONTRAST  TECHNIQUE: Multidetector CT imaging of the head, temporal bones, and cervical spine were performed using the standard protocol without intravenous contrast. Multiplanar CT image reconstructions of the cervical spine and temporal bones were also generated.  COMPARISON:  None.  FINDINGS: CT HEAD FINDINGS  There is no evidence of acute cortical infarct, intracranial hemorrhage, mass, midline shift, or extra-axial fluid collection. There is mild cerebral atrophy. Patchy periventricular white matter hypoattenuation is nonspecific but is compatible with minimal chronic small vessel ischemic disease. The visualized orbits are unremarkable. Visualized paranasal sinuses are clear. There are bilateral mastoid effusions.  CT TEMPORAL BONES FINDINGS  RIGHT: A small amount of debris is present in the external auditory canal. Superior portion of the tympanic membrane is not well seen. There is a moderate  amount of fluid/ soft tissue throughout the tympanic cavity. The malleus and incus appear intact. The stapes is partially obscured by fluid/soft tissue. The internal auditory canal, cochlea, vestibule, and semicircular canals are unremarkable. The mastoid air cells are completely opacified. There are areas of osseous thinning involving the tegmen  mastoideum, and dehiscence cannot be excluded.  LEFT: Focal soft tissue thickening/ cerumen is noted along the superior aspect of the external auditory canal. Tympanic membrane does not appear thickened, with portions of it are not well seen. There is a small amount of fluid/soft tissue in the tympanic cavity, predominantly in the epitympanum. The ossicles appear intact. The ossicles appear intact. The tympanic cavity is clear. The internal auditory canal, cochlea, vestibule, and semicircular canals are unremarkable. There is complete opacification of the mastoid air cells. There are areas of osseous thinning involving the tegmen mastoideum, and dehiscence cannot be excluded.  CT CERVICAL SPINE FINDINGS  Vertebral alignment is normal. Vertebral body heights are preserved without compression fracture. Prevertebral soft tissues are within normal limits. Moderate anterior osteophyte formation is present at C5-6 and C6-7. Mild disc space narrowing is noted at these two levels, with small amount of vacuum disc phenomenon present at C5-6. Moderate osseous overgrowth is noted at the atlantodental articulation. No acute cervical spine fracture is identified. Mild to moderate facet arthrosis is noted in the mid to upper cervical spine. Mild biapical pleural thickening is partially visualized.  IMPRESSION: 1. No evidence of acute intracranial abnormality. 2. Complete opacification of the mastoid air cells bilaterally with partial opacification of the right greater than left middle year. No temporal bone fracture is identified, and findings may reflect otomastoiditis. 3. No acute  osseous abnormality identified in the cervical spine.   Electronically Signed   By: Sebastian Ache   On: 10/09/2013 16:26   Ct Cervical Spine Wo Contrast  10/09/2013   CLINICAL DATA:  Seizures. Memory loss. Hit head during a seizure with bleeding from right ear. Neck pain, headache, and lightheadedness.  EXAM: CT HEAD WITHOUT CONTRAST  CT TEMPORAL BONES WITHOUT CONTRAST  CT CERVICAL SPINE WITHOUT CONTRAST  TECHNIQUE: Multidetector CT imaging of the head, temporal bones, and cervical spine were performed using the standard protocol without intravenous contrast. Multiplanar CT image reconstructions of the cervical spine and temporal bones were also generated.  COMPARISON:  None.  FINDINGS: CT HEAD FINDINGS  There is no evidence of acute cortical infarct, intracranial hemorrhage, mass, midline shift, or extra-axial fluid collection. There is mild cerebral atrophy. Patchy periventricular white matter hypoattenuation is nonspecific but is compatible with minimal chronic small vessel ischemic disease. The visualized orbits are unremarkable. Visualized paranasal sinuses are clear. There are bilateral mastoid effusions.  CT TEMPORAL BONES FINDINGS  RIGHT: A small amount of debris is present in the external auditory canal. Superior portion of the tympanic membrane is not well seen. There is a moderate amount of fluid/ soft tissue throughout the tympanic cavity. The malleus and incus appear intact. The stapes is partially obscured by fluid/soft tissue. The internal auditory canal, cochlea, vestibule, and semicircular canals are unremarkable. The mastoid air cells are completely opacified. There are areas of osseous thinning involving the tegmen mastoideum, and dehiscence cannot be excluded.  LEFT: Focal soft tissue thickening/ cerumen is noted along the superior aspect of the external auditory canal. Tympanic membrane does not appear thickened, with portions of it are not well seen. There is a small amount of fluid/soft  tissue in the tympanic cavity, predominantly in the epitympanum. The ossicles appear intact. The ossicles appear intact. The tympanic cavity is clear. The internal auditory canal, cochlea, vestibule, and semicircular canals are unremarkable. There is complete opacification of the mastoid air cells. There are areas of osseous thinning involving the tegmen mastoideum, and dehiscence cannot be excluded.  CT CERVICAL SPINE  FINDINGS  Vertebral alignment is normal. Vertebral body heights are preserved without compression fracture. Prevertebral soft tissues are within normal limits. Moderate anterior osteophyte formation is present at C5-6 and C6-7. Mild disc space narrowing is noted at these two levels, with small amount of vacuum disc phenomenon present at C5-6. Moderate osseous overgrowth is noted at the atlantodental articulation. No acute cervical spine fracture is identified. Mild to moderate facet arthrosis is noted in the mid to upper cervical spine. Mild biapical pleural thickening is partially visualized.  IMPRESSION: 1. No evidence of acute intracranial abnormality. 2. Complete opacification of the mastoid air cells bilaterally with partial opacification of the right greater than left middle year. No temporal bone fracture is identified, and findings may reflect otomastoiditis. 3. No acute osseous abnormality identified in the cervical spine.   Electronically Signed   By: Sebastian AcheAllen  Grady   On: 10/09/2013 16:26   Mr Brain Wo Contrast  10/09/2013   CLINICAL DATA:  [age] year old male with recent seizure activity. Headache and memory loss. Initial encounter.  EXAM: MRI HEAD WITHOUT CONTRAST  TECHNIQUE: Multiplanar, multiecho pulse sequences of the brain and surrounding structures were obtained without intravenous contrast.  COMPARISON:  Head, cervical spine and temporal bone CT without contrast 10/09/2013.  FINDINGS: Small curvilinear focus of restricted diffusion in the dorsal left thalamus very near but not  definitely affecting the tail of the left hippocampus (series 100, image 17 and series 101, image 18). No hippocampal complex restricted diffusion. Small left of midline linear superior cerebellar artery region focus of restricted diffusion (series 100, image 12 and series 101, image 16).  Furthermore there is a subtle area of left insula diffusion restriction suspected on series 100, image 18.  No right hemisphere diffusion restriction. Major intracranial vascular flow voids are preserved, with some intracranial artery dolichoectasia.  Bilateral mastoid effusions, with facilitated diffusion. Mild paranasal sinus inflammatory changes. Negative visualized nasopharynx. Visualized orbit soft tissues are within normal limits.  Chronic T2 and FLAIR hyperintensity in the right cerebellar ganglia (series 7, image 6). Scattered nonspecific cerebral white matter T2 and FLAIR hyperintensity, most pronounced in the anterior frontal lobes and greater on the left. There is also fairly extensive chronic encephalomalacia of the inferior right temporal lobe at the fusiform gyrus (series 10, image 25). T2 signal in the hippocampal complexes is within normal limits and symmetric.  No acute or chronic intracranial hemorrhage identified. No midline shift, mass effect, or evidence of intracranial mass lesion. No ventriculomegaly. Negative pituitary. Negative cervicomedullary junction and visualized cervical spine.  Normal bone marrow signal. Visualized scalp soft tissues are within normal limits. Tortuous cervical carotid arteries.  IMPRESSION: 1. 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 These are compatible with recent left posterior fossa small vessel ischemia. No associated mass effect or hemorrhage. 2. Superimposed small acute to subacute left insula infarct. This is in the left MCA territory, and could reflect synchronous small vessel  disease or indicate recent Mild embolic event. 3. Nonspecific right temporal lobe encephalomalacia. Scattered nonspecific cerebral and cerebellar white matter signal abnormality. 4. Intra and extracranial artery dolichoectasia. 5. Bilateral mastoid effusions, appear to be simple/sterile.   Electronically Signed   By: Augusto GambleLee  Hall M.D.   On: 10/09/2013 18:52   [place] Carotid Bilateral  10/10/2013   CLINICAL DATA:  Stroke  EXAM: BILATERAL CAROTID DUPLEX ULTRASOUND  TECHNIQUE: Wallace CullensGray scale imaging, color Doppler and duplex ultrasound were performed of bilateral  carotid and vertebral arteries in the neck.  COMPARISON:  None.  FINDINGS: Criteria: Quantification of carotid stenosis is based on velocity parameters that correlate the residual internal carotid diameter with NASCET-based stenosis levels, using the diameter of the distal internal carotid lumen as the denominator for stenosis measurement.  The following velocity measurements were obtained:  RIGHT  ICA:  74 cm/sec  CCA:  74 cm/sec  SYSTOLIC ICA/CCA RATIO:  1.0  DIASTOLIC ICA/CCA RATIO:  ECA:  78 cm/sec  LEFT  ICA:  70 cm/sec  CCA:  54 cm/sec  SYSTOLIC ICA/CCA RATIO:  1.3  DIASTOLIC ICA/CCA RATIO:  ECA:  80 cm/sec  RIGHT CAROTID ARTERY: Mild calcified plaque in the lower internal carotid. Minimal intimal thickening in the bulb. Low resistance internal carotid Doppler pattern.  RIGHT VERTEBRAL ARTERY:  Antegrade.  Low resistance Doppler pattern.  LEFT CAROTID ARTERY: Minimal scattered calcified plaque in the bulb. Low resistance internal carotid Doppler pattern. Mild calcified plaque in the lower internal carotid.  LEFT VERTEBRAL ARTERY:  Antegrade.  Normal Doppler pattern.  IMPRESSION: Less than 50% stenosis in the right and left internal carotid arteries.   Electronically Signed   By: Maryclare Bean M.D.   On: 10/10/2013 09:28   Ct Temporal Bones W/o Cm  10/09/2013   CLINICAL DATA:  Seizures. Memory loss. Hit head during a seizure with bleeding from right ear. Neck  pain, headache, and lightheadedness.  EXAM: CT HEAD WITHOUT CONTRAST  CT TEMPORAL BONES WITHOUT CONTRAST  CT CERVICAL SPINE WITHOUT CONTRAST  TECHNIQUE: Multidetector CT imaging of the head, temporal bones, and cervical spine were performed using the standard protocol without intravenous contrast. Multiplanar CT image reconstructions of the cervical spine and temporal bones were also generated.  COMPARISON:  None.  FINDINGS: CT HEAD FINDINGS  There is no evidence of acute cortical infarct, intracranial hemorrhage, mass, midline shift, or extra-axial fluid collection. There is mild cerebral atrophy. Patchy periventricular white matter hypoattenuation is nonspecific but is compatible with minimal chronic small vessel ischemic disease. The visualized orbits are unremarkable. Visualized paranasal sinuses are clear. There are bilateral mastoid effusions.  CT TEMPORAL BONES FINDINGS  RIGHT: A small amount of debris is present in the external auditory canal. Superior portion of the tympanic membrane is not well seen. There is a moderate amount of fluid/ soft tissue throughout the tympanic cavity. The malleus and incus appear intact. The stapes is partially obscured by fluid/soft tissue. The internal auditory canal, cochlea, vestibule, and semicircular canals are unremarkable. The mastoid air cells are completely opacified. There are areas of osseous thinning involving the tegmen mastoideum, and dehiscence cannot be excluded.  LEFT: Focal soft tissue thickening/ cerumen is noted along the superior aspect of the external auditory canal. Tympanic membrane does not appear thickened, with portions of it are not well seen. There is a small amount of fluid/soft tissue in the tympanic cavity, predominantly in the epitympanum. The ossicles appear intact. The ossicles appear intact. The tympanic cavity is clear. The internal auditory canal, cochlea, vestibule, and semicircular canals are unremarkable. There is complete opacification  of the mastoid air cells. There are areas of osseous thinning involving the tegmen mastoideum, and dehiscence cannot be excluded.  CT CERVICAL SPINE FINDINGS  Vertebral alignment is normal. Vertebral body heights are preserved without compression fracture. Prevertebral soft tissues are within normal limits. Moderate anterior osteophyte formation is present at C5-6 and C6-7. Mild disc space narrowing is noted at these two levels, with small amount of vacuum disc phenomenon present  at C5-6. Moderate osseous overgrowth is noted at the atlantodental articulation. No acute cervical spine fracture is identified. Mild to moderate facet arthrosis is noted in the mid to upper cervical spine. Mild biapical pleural thickening is partially visualized.  IMPRESSION: 1. No evidence of acute intracranial abnormality. 2. Complete opacification of the mastoid air cells bilaterally with partial opacification of the right greater than left middle year. No temporal bone fracture is identified, and findings may reflect otomastoiditis. 3. No acute osseous abnormality identified in the cervical spine.   Electronically Signed   By: Sebastian Ache   On: 10/09/2013 16:26

## 2013-10-27 NOTE — Patient Instructions (Signed)
Your physician recommends that you schedule a follow-up appointment in: 1 month    Your physician has requested that you regularly monitor and record your blood pressure readings at home. Please use the same machine at the same time of day to check your readings and record them to bring to your follow-up visit.

## 2013-11-10 ENCOUNTER — Other Ambulatory Visit: Payer: Self-pay | Admitting: *Deleted

## 2013-11-10 ENCOUNTER — Telehealth: Payer: Self-pay | Admitting: Cardiology

## 2013-11-10 MED ORDER — CLONIDINE HCL 0.3 MG PO TABS: 0.3000 mg | ORAL_TABLET | Freq: Two times a day (BID) | ORAL | Status: DC

## 2013-11-10 NOTE — Telephone Encounter (Signed)
Rx sent to pharm at pt request.  Already has OV scheduled with Dr. Diona BrownerMcDowell for 12/11/2013.

## 2013-11-23 ENCOUNTER — Ambulatory Visit (INDEPENDENT_AMBULATORY_CARE_PROVIDER_SITE_OTHER): Payer: Self-pay | Admitting: Otolaryngology

## 2013-11-23 DIAGNOSIS — H906 Mixed conductive and sensorineural hearing loss, bilateral: Secondary | ICD-10-CM

## 2013-12-11 ENCOUNTER — Ambulatory Visit (INDEPENDENT_AMBULATORY_CARE_PROVIDER_SITE_OTHER): Payer: Self-pay | Admitting: Cardiology

## 2013-12-11 ENCOUNTER — Encounter: Payer: Self-pay | Admitting: Cardiology

## 2013-12-11 ENCOUNTER — Other Ambulatory Visit: Payer: Self-pay | Admitting: *Deleted

## 2013-12-11 VITALS — BP 169/96 | HR 64 | Ht 67.0 in | Wt 155.0 lb

## 2013-12-11 DIAGNOSIS — Z8673 Personal history of transient ischemic attack (TIA), and cerebral infarction without residual deficits: Secondary | ICD-10-CM

## 2013-12-11 DIAGNOSIS — I10 Essential (primary) hypertension: Secondary | ICD-10-CM

## 2013-12-11 DIAGNOSIS — I639 Cerebral infarction, unspecified: Secondary | ICD-10-CM

## 2013-12-11 DIAGNOSIS — F1010 Alcohol abuse, uncomplicated: Secondary | ICD-10-CM

## 2013-12-11 MED ORDER — CLONIDINE HCL 0.3 MG PO TABS: 0.3000 mg | ORAL_TABLET | Freq: Two times a day (BID) | ORAL | Status: DC

## 2013-12-11 MED ORDER — LOVASTATIN 20 MG PO TABS: 20.0000 mg | ORAL_TABLET | Freq: Every day | ORAL | Status: DC

## 2013-12-11 NOTE — Assessment & Plan Note (Signed)
No clear evidence of cardioembolic source by TEE other than documentation of atherosclerosis within the aorta, he is on aspirin at this point. Followup cardiac monitor did not demonstrate any atrial fibrillation. No further workup planned at this time from a cardiac perspective, he will need to keep followup with Dr. Gerilyn Pilgrim as before.

## 2013-12-11 NOTE — Assessment & Plan Note (Signed)
Refill provided for both clonidine and Mevacor, since he does not have a primary care provider as yet. We did provide a list of local providers and he states that he plans to establish.

## 2013-12-11 NOTE — Patient Instructions (Signed)
Your physician recommends that you schedule a follow-up appointment in: 6 months. You will receive a reminder letter in the mail in about 4 months reminding you to call and schedule your appointment. If you don't receive this letter, please contact our office. Your physician recommends that you continue on your current medications as directed. Please refer to the Current Medication list given to you today. You have been provided with a list of primary care doctors in the area.

## 2013-12-11 NOTE — Assessment & Plan Note (Signed)
Reportedly cutting back on alcohol.

## 2013-12-11 NOTE — Progress Notes (Signed)
Clinical Summary Robert Walls is a 58 y.o.male last seen in the office by [name] in August. History is outlined below. We were involved in his care back in July, performed a TEE which was negative for definitive cardioembolic source of stroke. He did have aortic atherosclerosis was recommended that he be treated with aspirin. He was also requested they were a cardiac monitor to exclude any obvious atrial fibrillation. None was seen during his hospital stay.  Cardiac monitor strips reviewed, patient had sinus rhythm and sinus bradycardia with single PVC documented, no atrial fibrillation. I discussed this with him today.  He reports no chest pain or shortness of breath. States he has some trouble with his memory has been a problem over time. He is working on trying to [employer] coverage, does not have a primary care provider. He sees Robert Walls for his history of seizures and stroke - will be seeing him soon.  He states that he ran out of his medications recently.   Allergies  Allergen Reactions  . Lisinopril Other (See Comments)    Depleted sodium; resulted in hospitalization, with the HCTZ     Current Outpatient Prescriptions  Medication Sig Dispense Refill  . aspirin EC 81 MG EC tablet Take 1 tablet (81 mg total) by mouth daily.      . cloNIDine (CATAPRES) 0.3 MG tablet Take 1 tablet (0.3 mg total) by mouth 2 (two) times daily.  60 tablet  6  . levETIRAcetam (KEPPRA) 500 MG tablet Take 1 tablet (500 mg total) by mouth 2 (two) times daily.  60 tablet  1  . lovastatin (MEVACOR) 20 MG tablet Take 1 tablet (20 mg total) by mouth at bedtime.  30 tablet  6   No current facility-administered medications for this visit.    Past Medical History  Diagnosis Date  . Impaired hearing   . Essential hypertension, benign   . Seizures     Dilantin  . Concussion 1993  . Arthritis   . Back pain   . Anxiety   . History of stroke July 2015    Small acute to subacute  infarcts - TEE negative for cardioembolic source  . Aortic atherosclerosis     Documented by TEE    Social History Robert Walls reports that he has been smoking Cigarettes.  He has a 4 pack-year smoking history. He does not have any smokeless tobacco history on file. Robert Walls reports that he drinks alcohol.  Review of Systems Other systems reviewed and negative except as outlined.  Physical Examination Filed Vitals:   12/11/13 1407  BP: 169/96  Pulse: 64   Filed Weights   12/11/13 1405  Weight: 155 lb (70.308 kg)   Patient appears comfortable at rest. HEENT: Conjunctiva and lids normal, oropharynx clear. Neck: Supple, no elevated JVP or carotid bruits, no thyromegaly. Lungs: Clear to auscultation, nonlabored breathing at rest. Cardiac: Regular rate and rhythm, no S3 or significant systolic murmur, no pericardial rub. Abdomen: Soft, nontender, no hepatomegaly, bowel sounds present, no guarding or rebound. Extremities: No pitting edema, distal pulses 2+.   Problem List and Plan   History of stroke No clear evidence of cardioembolic source by TEE other than documentation of atherosclerosis within the aorta, he is on aspirin at this point. Followup cardiac monitor did not demonstrate any atrial fibrillation. No further workup planned at this time from a cardiac perspective, he will need to keep followup with Robert Walls as before.  Essential  hypertension, benign Refill provided for both clonidine and Mevacor, since he does not have a primary care provider as yet. We did provide a list of local providers and he states that he plans to establish.  Alcohol abuse Reportedly cutting back on alcohol.    Robert Walls, M.D., F.A.C.C.

## 2014-02-18 ENCOUNTER — Emergency Department (HOSPITAL_COMMUNITY): Payer: Medicaid Other

## 2014-02-18 ENCOUNTER — Observation Stay (HOSPITAL_COMMUNITY)
Admission: EM | Admit: 2014-02-18 | Discharge: 2014-02-20 | Disposition: A | Discharge status: Home / Self Care | Payer: Medicaid Other | Attending: Family Medicine | Admitting: Family Medicine

## 2014-02-18 ENCOUNTER — Encounter (HOSPITAL_COMMUNITY): Payer: Self-pay

## 2014-02-18 DIAGNOSIS — Z87828 Personal history of other (healed) physical injury and trauma: Secondary | ICD-10-CM | POA: Insufficient documentation

## 2014-02-18 DIAGNOSIS — Z720 Tobacco use: Secondary | ICD-10-CM | POA: Insufficient documentation

## 2014-02-18 DIAGNOSIS — I10 Essential (primary) hypertension: Secondary | ICD-10-CM | POA: Diagnosis present

## 2014-02-18 DIAGNOSIS — R410 Disorientation, unspecified: Principal | ICD-10-CM | POA: Diagnosis present

## 2014-02-18 DIAGNOSIS — I700 Atherosclerosis of aorta: Secondary | ICD-10-CM | POA: Diagnosis not present

## 2014-02-18 DIAGNOSIS — Z8673 Personal history of transient ischemic attack (TIA), and cerebral infarction without residual deficits: Secondary | ICD-10-CM

## 2014-02-18 DIAGNOSIS — H9190 Unspecified hearing loss, unspecified ear: Secondary | ICD-10-CM | POA: Insufficient documentation

## 2014-02-18 DIAGNOSIS — R4182 Altered mental status, unspecified: Secondary | ICD-10-CM

## 2014-02-18 DIAGNOSIS — R569 Unspecified convulsions: Secondary | ICD-10-CM

## 2014-02-18 DIAGNOSIS — Z79899 Other long term (current) drug therapy: Secondary | ICD-10-CM | POA: Diagnosis not present

## 2014-02-18 DIAGNOSIS — G40909 Epilepsy, unspecified, not intractable, without status epilepticus: Secondary | ICD-10-CM | POA: Insufficient documentation

## 2014-02-18 DIAGNOSIS — N289 Disorder of kidney and ureter, unspecified: Secondary | ICD-10-CM | POA: Diagnosis not present

## 2014-02-18 DIAGNOSIS — R7989 Other specified abnormal findings of blood chemistry: Secondary | ICD-10-CM

## 2014-02-18 DIAGNOSIS — M1990 Unspecified osteoarthritis, unspecified site: Secondary | ICD-10-CM | POA: Insufficient documentation

## 2014-02-18 DIAGNOSIS — F1210 Cannabis abuse, uncomplicated: Secondary | ICD-10-CM

## 2014-02-18 DIAGNOSIS — N189 Chronic kidney disease, unspecified: Secondary | ICD-10-CM | POA: Insufficient documentation

## 2014-02-18 HISTORY — DX: Cerebral infarction, unspecified: I63.9

## 2014-02-18 LAB — I-STAT TROPONIN, ED: TROPONIN I, POC: 0.02 ng/mL (ref 0.00–0.08)

## 2014-02-18 LAB — DIFFERENTIAL
BASOS ABS: 0.1 10*3/uL (ref 0.0–0.1)
BASOS PCT: 1 % (ref 0–1)
Eosinophils Absolute: 0.2 10*3/uL (ref 0.0–0.7)
Eosinophils Relative: 3 % (ref 0–5)
Lymphocytes Relative: 31 % (ref 12–46)
Lymphs Abs: 2.4 10*3/uL (ref 0.7–4.0)
MONO ABS: 0.5 10*3/uL (ref 0.1–1.0)
Monocytes Relative: 7 % (ref 3–12)
NEUTROS PCT: 58 % (ref 43–77)
Neutro Abs: 4.6 10*3/uL (ref 1.7–7.7)

## 2014-02-18 LAB — COMPREHENSIVE METABOLIC PANEL
ALBUMIN: 3.6 g/dL (ref 3.5–5.2)
ALT: 11 U/L (ref 0–53)
AST: 13 U/L (ref 0–37)
Alkaline Phosphatase: 75 U/L (ref 39–117)
Anion gap: 13 (ref 5–15)
BILIRUBIN TOTAL: 0.3 mg/dL (ref 0.3–1.2)
BUN: 18 mg/dL (ref 6–23)
CO2: 24 mEq/L (ref 19–32)
CREATININE: 1.91 mg/dL — AB (ref 0.50–1.35)
Calcium: 8.8 mg/dL (ref 8.4–10.5)
Chloride: 103 mEq/L (ref 96–112)
GFR calc Af Amer: 44 mL/min — ABNORMAL LOW (ref >90)
GFR calc non Af Amer: 38 mL/min — ABNORMAL LOW (ref >90)
Glucose, Bld: 91 mg/dL (ref 70–99)
POTASSIUM: 4.1 meq/L (ref 3.7–5.3)
Sodium: 140 mEq/L (ref 137–147)
TOTAL PROTEIN: 6.9 g/dL (ref 6.0–8.3)

## 2014-02-18 LAB — RAPID URINE DRUG SCREEN, HOSP PERFORMED
AMPHETAMINES: NOT DETECTED
BENZODIAZEPINES: POSITIVE — AB
Barbiturates: NOT DETECTED
Cocaine: NOT DETECTED
OPIATES: NOT DETECTED
Tetrahydrocannabinol: POSITIVE — AB

## 2014-02-18 LAB — APTT: APTT: 32 s (ref 24–37)

## 2014-02-18 LAB — URINALYSIS, ROUTINE W REFLEX MICROSCOPIC
BILIRUBIN URINE: NEGATIVE
GLUCOSE, UA: NEGATIVE mg/dL
KETONES UR: NEGATIVE mg/dL
Leukocytes, UA: NEGATIVE
Nitrite: NEGATIVE
PROTEIN: NEGATIVE mg/dL
Specific Gravity, Urine: 1.02 (ref 1.005–1.030)
UROBILINOGEN UA: 0.2 mg/dL (ref 0.0–1.0)
pH: 5.5 (ref 5.0–8.0)

## 2014-02-18 LAB — PROTIME-INR
INR: 1.04 (ref 0.00–1.49)
Prothrombin Time: 13.7 seconds (ref 11.6–15.2)

## 2014-02-18 LAB — URINE MICROSCOPIC-ADD ON

## 2014-02-18 LAB — I-STAT CHEM 8, ED
BUN: 18 mg/dL (ref 6–23)
CHLORIDE: 105 meq/L (ref 96–112)
Calcium, Ion: 1.16 mmol/L (ref 1.12–1.23)
Creatinine, Ser: 2 mg/dL — ABNORMAL HIGH (ref 0.50–1.35)
Glucose, Bld: 88 mg/dL (ref 70–99)
HCT: 41 % (ref 39.0–52.0)
Hemoglobin: 13.9 g/dL (ref 13.0–17.0)
POTASSIUM: 4 meq/L (ref 3.7–5.3)
SODIUM: 141 meq/L (ref 137–147)
TCO2: 22 mmol/L (ref 0–100)

## 2014-02-18 LAB — CBC
HCT: 38.7 % — ABNORMAL LOW (ref 39.0–52.0)
Hemoglobin: 12.9 g/dL — ABNORMAL LOW (ref 13.0–17.0)
MCH: 31.9 pg (ref 26.0–34.0)
MCHC: 33.3 g/dL (ref 30.0–36.0)
MCV: 95.8 fL (ref 78.0–100.0)
Platelets: 218 10*3/uL (ref 150–400)
RBC: 4.04 MIL/uL — ABNORMAL LOW (ref 4.22–5.81)
RDW: 12.5 % (ref 11.5–15.5)
WBC: 7.7 10*3/uL (ref 4.0–10.5)

## 2014-02-18 LAB — ETHANOL

## 2014-02-18 MED ORDER — ASPIRIN 325 MG PO TABS
325.0000 mg | ORAL_TABLET | Freq: Every day | ORAL | Status: DC
  Administered 2014-02-19: 325 mg via ORAL
  Filled 2014-02-18: qty 1

## 2014-02-18 MED ORDER — STROKE: EARLY STAGES OF RECOVERY BOOK
Freq: Once | Status: AC
  Administered 2014-02-19: 1
  Filled 2014-02-18: qty 1

## 2014-02-18 MED ORDER — CLONIDINE HCL 0.2 MG PO TABS
0.3000 mg | ORAL_TABLET | Freq: Two times a day (BID) | ORAL | Status: DC
  Administered 2014-02-19 – 2014-02-20 (×3): 0.3 mg via ORAL
  Filled 2014-02-18 (×6): qty 1

## 2014-02-18 MED ORDER — SODIUM CHLORIDE 0.9 % IJ SOLN
INTRAMUSCULAR | Status: AC
  Filled 2014-02-18: qty 30

## 2014-02-18 MED ORDER — PRAVASTATIN SODIUM 10 MG PO TABS
20.0000 mg | ORAL_TABLET | Freq: Every day | ORAL | Status: DC
  Filled 2014-02-18: qty 2

## 2014-02-18 MED ORDER — ENOXAPARIN SODIUM 40 MG/0.4ML ~~LOC~~ SOLN
40.0000 mg | Freq: Every day | SUBCUTANEOUS | Status: DC
  Filled 2014-02-18: qty 0.4

## 2014-02-18 MED ORDER — SODIUM CHLORIDE 0.9 % IJ SOLN
INTRAMUSCULAR | Status: AC
  Filled 2014-02-18: qty 500

## 2014-02-18 MED ORDER — SODIUM CHLORIDE 0.9 % IV SOLN
INTRAVENOUS | Status: DC
  Administered 2014-02-19 – 2014-02-20 (×3): via INTRAVENOUS

## 2014-02-18 NOTE — H&P (Signed)
History and Physical  Robert CrowBryan K Hoeg ONG:[id number] DOB: 01-Dec-1955 DOA: 02/18/2014  Referring physician: Dr Clarene DukeMcManus, ED physician PCP: No PCP Per Patient   Chief Complaint: Confusion  HPI: Robert Walls is a [age] y.o. 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 Additionally, the patient had a seizure and was placed on Keppra, although he has not returned to see the neurologist and has not been taking the Keppra for actually 6 weeks. Since the time of his stroke, the patient has had difficulty with his memory, which he defines as mostly forgetfulness.  This afternoon, the patient had an episode of confusion. He was at the house where he is going to be moving into and felt himself having difficulty navigating rooms in the house. When he left the house, he got lost walking 2 blocks to his current home. When he arrived home, he did not recognize his son. His son concerned and called EMS, who brought him to the hospital. The patient was aware of his confusion. He denies episodes of this in the past. He denies loss of consciousness. He feels that his confusion has resolved.  Of note, his urine drug screen was positive for THC and for benzodiazepines. He admits to smoking marijuana on Thanksgiving, but nothing since that time. He also admits to using Xanax yesterday, which he obtained from a friend as payment for helping with some chores. He denies any drug use today.   Review of Systems:   Pt denies any fevers, chills, nausea, vomiting, headaches, blurred vision, neck pain, chest pain, palpitations, shortness of breath, abdominal pain, weakness, numbness.  Review of systems are otherwise negative  Past Medical History  Diagnosis Date  . Impaired hearing   . Essential hypertension, benign   . Seizures     Dilantin  . Concussion 1993  . Arthritis   . Back pain   . Anxiety   . History of stroke  July 2015    Small acute to subacute infarcts - TEE negative for cardioembolic source  . Aortic atherosclerosis     Documented by TEE  . Stroke    Past Surgical History  Procedure Laterality Date  . Inner ear surgery      Childhood  . Back surgery  1993  . Finger surgery  2003    Left ring finger  . Tee without cardioversion N/A 10/13/2013    Procedure: TRANSESOPHAGEAL ECHOCARDIOGRAM (TEE) WITH PROPOFOL;  Surgeon: Jonelle SidleSamuel G McDowell, MD;  Location: [facility];  Service: Cardiovascular;  Laterality: N/A;   Social History:  reports that he has been smoking Cigarettes.  He has a 4 pack-year smoking history. He does not have any smokeless tobacco history on file. He reports that he drinks alcohol. He reports that he uses illicit drugs (Marijuana). Patient lives at home & is able to participate in activities of daily living without assistance  Allergies  Allergen Reactions  . Lisinopril Other (See Comments)    Depleted sodium; resulted in hospitalization, with the HCTZ     Family History  Problem Relation Age of Onset  . Anesthesia problems Neg Hx   . Hypotension Neg Hx   . Malignant hyperthermia Neg Hx   . Pseudochol deficiency Neg Hx       Prior to Admission medications   Medication Sig Start Date End Date Taking? Authorizing Provider  cloNIDine (CATAPRES) 0.3 MG tablet Take 1 tablet (0.3 mg  total) by mouth 2 (two) times daily. 12/11/13  Yes Jonelle Sidle, MD  lovastatin (MEVACOR) 20 MG tablet Take 1 tablet (20 mg total) by mouth at bedtime. 12/11/13  Yes Jonelle Sidle, MD  aspirin EC 81 MG EC tablet Take 1 tablet (81 mg total) by mouth daily. Patient not taking: Reported on 02/18/2014 10/14/13   Standley Brooking, MD  levETIRAcetam (KEPPRA) 500 MG tablet Take 1 tablet (500 mg total) by mouth 2 (two) times daily. 10/13/13   Standley Brooking, MD    Physical Exam: BP 112/63 mmHg  Pulse 56  Temp(Src) 98 F (36.7 C) (Oral)  Resp 11  Ht 5\' 7"  (1.702 m)  Wt 71.215 kg (157 lb)   BMI 24.58 kg/m2  SpO2 100%  General: Middle-aged Caucasian male. Awake and alert and oriented x3. No acute cardiopulmonary distress.  Eyes: Pupils equal, round, reactive to light. Extraocular muscles are intact. Sclerae anicteric and noninjected.  ENT: Moist mucosal membranes. No mucosal lesions.   Neck: Neck supple without lymphadenopathy. No carotid bruits. No masses palpated.  Cardiovascular: Regular rate with normal S1-S2 sounds. No murmurs, rubs, gallops auscultated. No JVD.  Respiratory: Good respiratory effort with no wheezes, rales, rhonchi. Lungs clear to auscultation bilaterally.  Abdomen: Soft, nontender, nondistended. Active bowel sounds. No masses or hepatosplenomegaly  Skin: Dry, warm to touch. 2+ dorsalis pedis and radial pulses. Musculoskeletal: No calf or leg pain. All major joints not erythematous nontender.  Psychiatric: Intact judgment and insight.  Neurologic: No focal neurological deficits. Cranial nerves II through XII are grossly intact. Strength intact in upper and lower extremities bilaterally and are equal. Coordination is intact.           Labs on Admission:  Basic Metabolic Panel:  Recent Labs Lab 02/18/14 1904 02/18/14 1915  NA 140 141  K 4.1 4.0  CL 103 105  CO2 24  --   GLUCOSE 91 88  BUN 18 18  CREATININE 1.91* 2.00*  CALCIUM 8.8  --    Liver Function Tests:  Recent Labs Lab 02/18/14 1904  AST 13  ALT 11  ALKPHOS 75  BILITOT 0.3  PROT 6.9  ALBUMIN 3.6   No results for input(s): LIPASE, AMYLASE in the last 168 hours. No results for input(s): AMMONIA in the last 168 hours. CBC:  Recent Labs Lab 02/18/14 1904 02/18/14 1915  WBC 7.7  --   NEUTROABS 4.6  --   HGB 12.9* 13.9  HCT 38.7* 41.0  MCV 95.8  --   PLT 218  --    Cardiac Enzymes: No results for input(s): CKTOTAL, CKMB, CKMBINDEX, TROPONINI in the last 168 hours.  BNP (last 3 results) No results for input(s): PROBNP in the last 8760 hours. CBG: No results for  input(s): GLUCAP in the last 168 hours.  Radiological Exams on Admission: Dg Chest 2 View  02/18/2014   CLINICAL DATA:  Altered mental status.  Confusion.  EXAM: CHEST  2 VIEW  COMPARISON:  06/10/2011  FINDINGS: Heart size and pulmonary vascularity are normal and the lungs are clear. No acute osseous abnormality. No effusions. Harrington rods in the lumbar spine.  IMPRESSION: No active cardiopulmonary disease.   Electronically Signed   By: Geanie Cooley M.D.   On: 02/18/2014 20:07   Ct Head Wo Contrast  02/18/2014   CLINICAL DATA:  Initial evaluation for acute altered mental status.  EXAM: CT HEAD WITHOUT CONTRAST  TECHNIQUE: Contiguous axial images were obtained from the base of the skull  through the vertex without intravenous contrast.  COMPARISON:  Prior head CT from 10/09/2013.  FINDINGS: There is no acute intracranial hemorrhage or infarct. No mass lesion or midline shift. Gray-white matter differentiation is well maintained. Ventricles are normal in size without evidence of hydrocephalus. Right temporal lobe encephalomalacia again noted, stable. No extra-axial fluid collection.  The calvarium is intact.  Orbital soft tissues are within normal limits.  The paranasal sinuses are well pneumatized and free of fluid. Mastoid air cells are largely opacified bilaterally, stable from prior.  Scalp soft tissues are unremarkable.  IMPRESSION: 1. No acute intracranial abnormality. 2. Complete opacification of the mastoid air cells bilaterally, which appears chronic in nature, and is similar to prior study.   Electronically Signed   By: Rise MuBenjamin  McClintock M.D.   On: 02/18/2014 19:10    EKG: Independently reviewed. Slight sinus bradycardia with a rate of 59. Q waves in V1 and V2 suggestive of old septal infarct.  No acute ST changes  Assessment/Plan Present on Admission:  . Confusion  #1 confusion  #2 acute renal insufficiency #3 drug use #4 history of CVA #5 hypertension  Plan #1 admit for  observation #2 MRI/MRA to rule out TIA/new ischemia #3 consult neurology #4 I do not feel that the patient needs an echocardiogram ordered duplex as part of the TIA workup given that the patient has had recent scans in the middle of July #5 continue home medications #6 IV fluids for rehydration.  DVT prophylaxis: Lovenox  Consultants: Neurology  Code Status: Full  Family Communication: None   Disposition Plan: Home following evaluation  Time spent: 50 minutes  Candelaria CelesteJacob Rudine Rieger, DO [facility] Pager [phone]

## 2014-02-18 NOTE — ED Provider Notes (Signed)
CSN: [id number]     Arrival date & time 02/18/14  1821 History   First MD Initiated Contact with Patient 02/18/14 1842     Chief Complaint  Patient presents with  . Altered Mental Status      HPI Pt was seen at 1840. Per EMS and pt report, c/o gradual onset and improvement in one episode of "confusion" that occurred tonight "around 1700." Pt states he felt like going for a walk in his neighborhood approximately 1700. States he got outside, started walking and then "got confused." States he was "disoriented" and "didn't know where I was or where I was going." States he "felt like I was walking drunk." States he was "able to make it back to my house somehow" where his son was. Pt states it was dark by this time. States he "didn't recognize my son," so he called EMS. Pt states he is "starting to feel better now." EMS reported pt was able to walk to the ambulance with steady gait. Pt recalls events PTA. Denies visual changes, no focal motor weakness, no tingling/numbness in extremities, no facial droop, no slurred speech, no syncope, no fall, no CP/SOB, no abd pain, no N/V/D.   Past Medical History  Diagnosis Date  . Impaired hearing   . Essential hypertension, benign   . Seizures     Dilantin  . Concussion 1993  . Arthritis   . Back pain   . Anxiety   . History of stroke July 2015    Small acute to subacute infarcts - TEE negative for cardioembolic source  . Aortic atherosclerosis     Documented by TEE  . Stroke    Past Surgical History  Procedure Laterality Date  . Inner ear surgery      Childhood  . Back surgery  1993  . Finger surgery  2003    Left ring finger  . Tee without cardioversion N/A 10/13/2013    Procedure: TRANSESOPHAGEAL ECHOCARDIOGRAM (TEE) WITH PROPOFOL;  Surgeon: Jonelle SidleSamuel G McDowell, MD;  Location: [facility];  Service: Cardiovascular;  Laterality: N/A;   Family History  Problem Relation Age of Onset  . Anesthesia problems Neg Hx   . Hypotension Neg Hx   .  Malignant hyperthermia Neg Hx   . Pseudochol deficiency Neg Hx    History  Substance Use Topics  . Smoking status: Current Every Day Smoker -- 0.50 packs/day for 8 years    Types: Cigarettes  . Smokeless tobacco: Not on file  . Alcohol Use: Yes     Comment: former- last etoh was June    Review of Systems ROS: Statement: All systems negative except as marked or noted in the HPI; Constitutional: Negative for fever and chills. ; ; Eyes: Negative for eye pain, redness and discharge. ; ; ENMT: Negative for ear pain, hoarseness, nasal congestion, sinus pressure and sore throat. ; ; Cardiovascular: Negative for chest pain, palpitations, diaphoresis, dyspnea and peripheral edema. ; ; Respiratory: Negative for cough, wheezing and stridor. ; ; Gastrointestinal: Negative for nausea, vomiting, diarrhea, abdominal pain, blood in stool, hematemesis, jaundice and rectal bleeding. . ; ; Genitourinary: Negative for dysuria, flank pain and hematuria. ; ; Musculoskeletal: Negative for back pain and neck pain. Negative for swelling and trauma.; ; Skin: Negative for pruritus, rash, abrasions, blisters, bruising and skin lesion.; ; Neuro: +AMS, confusion. Negative for headache, lightheadedness and neck stiffness. Negative for weakness, altered level of consciousness , extremity weakness, paresthesias, involuntary movement, seizure and syncope.  Allergies  Lisinopril  Home Medications   Prior to Admission medications   Medication Sig Start Date End Date Taking? Authorizing Provider  cloNIDine (CATAPRES) 0.3 MG tablet Take 1 tablet (0.3 mg total) by mouth 2 (two) times daily. 12/11/13  Yes Jonelle SidleSamuel G McDowell, MD  lovastatin (MEVACOR) 20 MG tablet Take 1 tablet (20 mg total) by mouth at bedtime. 12/11/13  Yes Jonelle SidleSamuel G McDowell, MD  aspirin EC 81 MG EC tablet Take 1 tablet (81 mg total) by mouth daily. Patient not taking: Reported on 02/18/2014 10/14/13   Standley Brookinganiel P Goodrich, MD  levETIRAcetam (KEPPRA) 500 MG tablet  Take 1 tablet (500 mg total) by mouth 2 (two) times daily. 10/13/13   Standley Brookinganiel P Goodrich, MD   BP 112/63 mmHg  Pulse 56  Temp(Src) 98 F (36.7 C) (Oral)  Resp 11  Ht 5\' 7"  (1.702 m)  Wt 157 lb (71.215 kg)  BMI 24.58 kg/m2  SpO2 100% Physical Exam  1845: Physical examination:  Nursing notes reviewed; Vital signs and O2 SAT reviewed;  Constitutional: Well developed, Well nourished, Well hydrated, In no acute distress; Head:  Normocephalic, atraumatic; Eyes: EOMI, PERRL, No scleral icterus; ENMT: Mouth and pharynx normal, Mucous membranes moist; Neck: Supple, Full range of motion, No lymphadenopathy; Cardiovascular: Regular rate and rhythm, No gallop; Respiratory: Breath sounds clear & equal bilaterally, No rales, rhonchi, wheezes.  Speaking full sentences with ease, Normal respiratory effort/excursion; Chest: Nontender, Movement normal; Abdomen: Soft, Nontender, Nondistended, Normal bowel sounds; Genitourinary: No CVA tenderness; Extremities: Pulses normal, No tenderness, No edema, No calf edema or asymmetry.; Neuro: AA&Ox3, Major CN grossly intact.Speech clear.  No facial droop.  No nystagmus. Grips equal. Strength 5/5 equal bilat UE's and LE's.  DTR 2/4 equal bilat UE's and LE's.  No gross sensory deficits.  Normal cerebellar testing bilat UE's (finger-nose) and LE's (heel-shin)..; Skin: Color normal, Warm, Dry.   ED Course  Procedures     EKG Interpretation   Date/Time:  Sunday February 18 2014 18:35:53 EST Ventricular Rate:  59 PR Interval:  136 QRS Duration: 80 QT Interval:  432 QTC Calculation: 427 R Axis:   48 Text Interpretation:  Sinus bradycardia with occasional Premature  ventricular complexes Septal infarct , age undetermined T wave abnormality  Inferolateral leads Abnormal ECG When compared with ECG of 10/10/2013 T  wave abnormality Inferolateral leads is more pronounced Confirmed by  [facility] Clinic[facility]CCMAN[facility]  MD, Nicholos JohnsKATHLEEN [phone]([id number]) on 02/18/2014 7:31:47 PM      MDM  MDM Reviewed:  previous chart, nursing note and vitals Reviewed previous: labs and ECG Interpretation: labs, ECG, x-ray and CT scan     Results for orders placed or performed during the hospital encounter of 02/18/14  Ethanol  Result Value Ref Range   Alcohol, Ethyl (B) <11 0 - 11 mg/dL  Protime-INR  Result Value Ref Range   Prothrombin Time 13.7 11.6 - 15.2 seconds   INR 1.04 0.00 - 1.49  APTT  Result Value Ref Range   aPTT 32 24 - 37 seconds  CBC  Result Value Ref Range   WBC 7.7 4.0 - 10.5 K/uL   RBC 4.04 (L) 4.22 - 5.81 MIL/uL   Hemoglobin 12.9 (L) 13.0 - 17.0 g/dL   HCT 60.438.7 (L) 54.039.0 - 98.152.0 %   MCV 95.8 78.0 - 100.0 fL   MCH 31.9 26.0 - 34.0 pg   MCHC 33.3 30.0 - 36.0 g/dL   RDW 19.112.5 47.811.5 - 29.515.5 %   Platelets 218 150 - 400 K/uL  Differential  Result Value Ref Range   Neutrophils Relative % 58 43 - 77 %   Neutro Abs 4.6 1.7 - 7.7 K/uL   Lymphocytes Relative 31 12 - 46 %   Lymphs Abs 2.4 0.7 - 4.0 K/uL   Monocytes Relative 7 3 - 12 %   Monocytes Absolute 0.5 0.1 - 1.0 K/uL   Eosinophils Relative 3 0 - 5 %   Eosinophils Absolute 0.2 0.0 - 0.7 K/uL   Basophils Relative 1 0 - 1 %   Basophils Absolute 0.1 0.0 - 0.1 K/uL  Comprehensive metabolic panel  Result Value Ref Range   Sodium 140 137 - 147 mEq/L   Potassium 4.1 3.7 - 5.3 mEq/L   Chloride 103 96 - 112 mEq/L   CO2 24 19 - 32 mEq/L   Glucose, Bld 91 70 - 99 mg/dL   BUN 18 6 - 23 mg/dL   Creatinine, Ser 1.61 (H) 0.50 - 1.35 mg/dL   Calcium 8.8 8.4 - 09.6 mg/dL   Total Protein 6.9 6.0 - 8.3 g/dL   Albumin 3.6 3.5 - 5.2 g/dL   AST 13 0 - 37 U/L   ALT 11 0 - 53 U/L   Alkaline Phosphatase 75 39 - 117 U/L   Total Bilirubin 0.3 0.3 - 1.2 mg/dL   GFR calc non Af Amer 38 (L) >90 mL/min   GFR calc Af Amer 44 (L) >90 mL/min   Anion gap 13 5 - 15  Urine Drug Screen  Result Value Ref Range   Opiates NONE DETECTED NONE DETECTED   Cocaine NONE DETECTED NONE DETECTED   Benzodiazepines POSITIVE (A) NONE DETECTED   Amphetamines NONE  DETECTED NONE DETECTED   Tetrahydrocannabinol POSITIVE (A) NONE DETECTED   Barbiturates NONE DETECTED NONE DETECTED  Urinalysis, Routine w reflex microscopic  Result Value Ref Range   Color, Urine YELLOW YELLOW   APPearance CLEAR CLEAR   Specific Gravity, Urine 1.020 1.005 - 1.030   pH 5.5 5.0 - 8.0   Glucose, UA NEGATIVE NEGATIVE mg/dL   Hgb urine dipstick LARGE (A) NEGATIVE   Bilirubin Urine NEGATIVE NEGATIVE   Ketones, ur NEGATIVE NEGATIVE mg/dL   Protein, ur NEGATIVE NEGATIVE mg/dL   Urobilinogen, UA 0.2 0.0 - 1.0 mg/dL   Nitrite NEGATIVE NEGATIVE   Leukocytes, UA NEGATIVE NEGATIVE  Urine microscopic-add on  Result Value Ref Range   RBC / HPF 11-20 <3 RBC/hpf   Casts HYALINE CASTS (A) NEGATIVE  I-Stat Chem 8, ED  Result Value Ref Range   Sodium 141 137 - 147 mEq/L   Potassium 4.0 3.7 - 5.3 mEq/L   Chloride 105 96 - 112 mEq/L   BUN 18 6 - 23 mg/dL   Creatinine, Ser 0.45 (H) 0.50 - 1.35 mg/dL   Glucose, Bld 88 70 - 99 mg/dL   Calcium, Ion 4.09 8.11 - 1.23 mmol/L   TCO2 22 0 - 100 mmol/L   Hemoglobin 13.9 13.0 - 17.0 g/dL   HCT 91.4 78.2 - 95.6 %  I-Stat Troponin, ED (not at [facility])  Result Value Ref Range   Troponin i, poc 0.02 0.00 - 0.08 ng/mL   Comment 3           Dg Chest 2 View 02/18/2014   CLINICAL DATA:  Altered mental status.  Confusion.  EXAM: CHEST  2 VIEW  COMPARISON:  06/10/2011  FINDINGS: Heart size and pulmonary vascularity are normal and the lungs are clear. No acute osseous abnormality. No effusions. Harrington rods in  the lumbar spine.  IMPRESSION: No active cardiopulmonary disease.   Electronically Signed   By: Geanie Cooley M.D.   On: 02/18/2014 20:07   Ct Head Wo Contrast 02/18/2014   CLINICAL DATA:  Initial evaluation for acute altered mental status.  EXAM: CT HEAD WITHOUT CONTRAST  TECHNIQUE: Contiguous axial images were obtained from the base of the skull through the vertex without intravenous contrast.  COMPARISON:  Prior head CT from 10/09/2013.   FINDINGS: There is no acute intracranial hemorrhage or infarct. No mass lesion or midline shift. Gray-white matter differentiation is well maintained. Ventricles are normal in size without evidence of hydrocephalus. Right temporal lobe encephalomalacia again noted, stable. No extra-axial fluid collection.  The calvarium is intact.  Orbital soft tissues are within normal limits.  The paranasal sinuses are well pneumatized and free of fluid. Mastoid air cells are largely opacified bilaterally, stable from prior.  Scalp soft tissues are unremarkable.  IMPRESSION: 1. No acute intracranial abnormality. 2. Complete opacification of the mastoid air cells bilaterally, which appears chronic in nature, and is similar to prior study.   Electronically Signed   By: Rise Mu M.D.   On: 02/18/2014 19:10    2030:  Pt continues to state he is "feeling better" since his arrival to the ED. VS remain stable, neuro exam remains intact and unchanged. Pt not code stroke given unk exact LKW time, as well as NIH score 0, and improving symptoms by his arrival to the ED.  Will admit for MRI. T/C to [facility]. Adrian Blackwater, case discussed, including:  HPI, pertinent PM/SHx, VS/PE, dx testing, ED course and treatment:  Agreeable to come to ED for evaluation for admission.    Samuel Jester, DO 02/19/14 1721

## 2014-02-18 NOTE — ED Notes (Signed)
EMS reports approx 30min ago pt had started walking to his new house and suddenly lost his vision and was confused.  Reports has history of CVA in June.  EMS reports pt was ambulatory with steady gait, alert, oriented, but still having difficulty with vision.  EMS reports pt still having some confusion.  Pt says things look "darker" than normal.  Reports pupils are equal, slightly constricted, and nonreactive.  CBG was 86.

## 2014-02-19 ENCOUNTER — Other Ambulatory Visit (HOSPITAL_COMMUNITY): Payer: Self-pay

## 2014-02-19 ENCOUNTER — Encounter (HOSPITAL_COMMUNITY): Payer: Self-pay | Admitting: Radiology

## 2014-02-19 ENCOUNTER — Observation Stay (HOSPITAL_COMMUNITY): Payer: Medicaid Other

## 2014-02-19 DIAGNOSIS — Z8673 Personal history of transient ischemic attack (TIA), and cerebral infarction without residual deficits: Secondary | ICD-10-CM | POA: Diagnosis not present

## 2014-02-19 DIAGNOSIS — I10 Essential (primary) hypertension: Secondary | ICD-10-CM | POA: Diagnosis not present

## 2014-02-19 DIAGNOSIS — R569 Unspecified convulsions: Secondary | ICD-10-CM

## 2014-02-19 DIAGNOSIS — R410 Disorientation, unspecified: Secondary | ICD-10-CM | POA: Diagnosis not present

## 2014-02-19 LAB — LIPID PANEL
Cholesterol: 140 mg/dL (ref 0–200)
HDL: 39 mg/dL — ABNORMAL LOW (ref >39)
LDL CALC: 76 mg/dL (ref 0–99)
Total CHOL/HDL Ratio: 3.6 RATIO
Triglycerides: 123 mg/dL (ref <150)
VLDL: 25 mg/dL (ref 0–40)

## 2014-02-19 MED ORDER — AMLODIPINE BESYLATE 5 MG PO TABS
5.0000 mg | ORAL_TABLET | Freq: Every day | ORAL | Status: DC
  Administered 2014-02-19: 5 mg via ORAL
  Filled 2014-02-19: qty 1

## 2014-02-19 MED ORDER — LEVETIRACETAM 500 MG PO TABS
500.0000 mg | ORAL_TABLET | Freq: Two times a day (BID) | ORAL | Status: DC
  Administered 2014-02-19 – 2014-02-20 (×3): 500 mg via ORAL
  Filled 2014-02-19 (×3): qty 1

## 2014-02-19 MED ORDER — ACETAMINOPHEN 325 MG PO TABS
650.0000 mg | ORAL_TABLET | ORAL | Status: DC | PRN
  Administered 2014-02-19 – 2014-02-20 (×4): 650 mg via ORAL
  Filled 2014-02-19 (×5): qty 2

## 2014-02-19 MED ORDER — HYDRALAZINE HCL 20 MG/ML IJ SOLN
10.0000 mg | Freq: Four times a day (QID) | INTRAMUSCULAR | Status: DC | PRN
  Administered 2014-02-19: 10 mg via INTRAVENOUS
  Filled 2014-02-19: qty 1

## 2014-02-19 MED ORDER — PRAVASTATIN SODIUM 10 MG PO TABS
20.0000 mg | ORAL_TABLET | Freq: Every day | ORAL | Status: DC
  Administered 2014-02-19: 20 mg via ORAL
  Filled 2014-02-19: qty 2

## 2014-02-19 NOTE — Progress Notes (Signed)
UR completed 

## 2014-02-19 NOTE — Progress Notes (Signed)
[facility] PROGRESS NOTE  Robert Walls YQM:[id number] DOB: 02/13/1956 DOA: 02/18/2014 PCP: No PCP Per Patient  Assessment/Plan: 1. Confusion - No new stroke on MRI of brain - Neurology consult pending - Patient has not been taking his anti-seizure medications. He reports he is not able to afford them - Obtain EEG - Etiology uncertain may have been to 2 medication as outpatient as patient took benzodiazepine the day prior versus seizure-like activity as patient is not on his Keppra at home.  2. Seizure - Will restart Keppra - EEG ordered - Neurology on board - May be contributing to principle problem  3. HTN - Not well controlled we'll continue clonidine - add amlodipine given elevated BP values  Code Status: full Family Communication: discussed directly with patient Disposition Plan: Pending further recommendations from specialist involved.   Consultants:  neurology  Procedures:  MRI/MRA of brain  CT of head  Antibiotics:  None   HPI/Subjective: Pt feels like he is back at baseline.   Objective: Filed Vitals:   02/19/14 1329  BP: 173/107  Pulse: 60  Temp: 98 F (36.7 C)  Resp: 19    Intake/Output Summary (Last 24 hours) at 02/19/14 1404 Last data filed at 02/19/14 1257  Gross per 24 hour  Intake    840 ml  Output   1200 ml  Net   -360 ml   Filed Weights   02/18/14 1827 02/18/14 2224  Weight: 71.215 kg (157 lb) 67.7 kg (149 lb 4 oz)    Exam:   General:  Pt in nad, alert and awake  Cardiovascular: rrr, no mrg  Respiratory: cta bl, no wheezes  Abdomen: soft, nd, nt  Musculoskeletal: no cyanosis or clubbing   Data Reviewed: Basic Metabolic Panel:  Recent Labs Lab 02/18/14 1904 02/18/14 1915  NA 140 141  K 4.1 4.0  CL 103 105  CO2 24  --   GLUCOSE 91 88  BUN 18 18  CREATININE 1.91* 2.00*  CALCIUM 8.8  --    Liver Function Tests:  Recent Labs Lab 02/18/14 1904  AST 13  ALT 11  ALKPHOS 75  BILITOT 0.3  PROT 6.9   ALBUMIN 3.6   No results for input(s): LIPASE, AMYLASE in the last 168 hours. No results for input(s): AMMONIA in the last 168 hours. CBC:  Recent Labs Lab 02/18/14 1904 02/18/14 1915  WBC 7.7  --   NEUTROABS 4.6  --   HGB 12.9* 13.9  HCT 38.7* 41.0  MCV 95.8  --   PLT 218  --    Cardiac Enzymes: No results for input(s): CKTOTAL, CKMB, CKMBINDEX, TROPONINI in the last 168 hours. BNP (last 3 results) No results for input(s): PROBNP in the last 8760 hours. CBG: No results for input(s): GLUCAP in the last 168 hours.  No results found for this or any previous visit (from the past 240 hour(s)).   Studies: Dg Chest 2 View  02/18/2014   CLINICAL DATA:  Altered mental status.  Confusion.  EXAM: CHEST  2 VIEW  COMPARISON:  06/10/2011  FINDINGS: Heart size and pulmonary vascularity are normal and the lungs are clear. No acute osseous abnormality. No effusions. Harrington rods in the lumbar spine.  IMPRESSION: No active cardiopulmonary disease.   Electronically Signed   By: Geanie CooleyJim  Maxwell M.D.   On: 02/18/2014 20:07   Ct Head Wo Contrast  02/18/2014   CLINICAL DATA:  Initial evaluation for acute altered mental status.  EXAM: CT HEAD WITHOUT CONTRAST  TECHNIQUE: Contiguous axial images were obtained from the base of the skull through the vertex without intravenous contrast.  COMPARISON:  Prior head CT from 10/09/2013.  FINDINGS: There is no acute intracranial hemorrhage or infarct. No mass lesion or midline shift. Gray-white matter differentiation is well maintained. Ventricles are normal in size without evidence of hydrocephalus. Right temporal lobe encephalomalacia again noted, stable. No extra-axial fluid collection.  The calvarium is intact.  Orbital soft tissues are within normal limits.  The paranasal sinuses are well pneumatized and free of fluid. Mastoid air cells are largely opacified bilaterally, stable from prior.  Scalp soft tissues are unremarkable.  IMPRESSION: 1. No acute  intracranial abnormality. 2. Complete opacification of the mastoid air cells bilaterally, which appears chronic in nature, and is similar to prior study.   Electronically Signed   By: Rise MuBenjamin  McClintock M.D.   On: 02/18/2014 19:10   Mr Robert LatchMra Head Wo Contrast  02/19/2014   CLINICAL DATA:  58 year old male with acute confusion. Initial encounter. Personal history of prior infarcts.  EXAM: MRI HEAD WITHOUT CONTRAST  MRA HEAD WITHOUT CONTRAST  TECHNIQUE: Multiplanar, multiecho pulse sequences of the brain and surrounding structures were obtained without intravenous contrast. Angiographic images of the head were obtained using MRA technique without contrast.  COMPARISON:  Brain MRI 10/09/2013. Head CT without contrast 02/18/2014.  FINDINGS: MRI HEAD FINDINGS  Stable cerebral volume. Major intracranial vascular flow voids are stable. No restricted diffusion or evidence of acute infarction.  No midline shift, mass effect, evidence of mass lesion, ventriculomegaly, extra-axial collection or acute intracranial hemorrhage. Cervicomedullary junction and pituitary are within normal limits.  Interval expected evolution of small dorsal left thalamus and superior cerebellar infarcts. There is a small chronic micro hemorrhage in the right cerebellum on series 7, image 6 with regressed adjacent T2 and FLAIR hyperintensity which may have reflected mild edema previously. Increased nonspecific T2 hyperintensity in the pons greater on the left. Elsewhere stable gray and white matter signal, including left inferior temporal lobe encephalomalacia (series 8, image 22).  Chronic mastoid effusions. Stable internal auditory structures. Negative visualized nasopharynx. No ventriculomegaly. No intracranial mass effect. No acute intracranial hemorrhage identified. Negative pituitary, cervicomedullary junction and visualized cervical spine. Visualized scalp soft tissues are within normal limits. Stable orbits soft tissues. Stable paranasal  sinuses. Bone marrow signal within normal limits.  MRA HEAD FINDINGS  Antegrade flow in the posterior circulation with tortuous distal vertebral arteries, the right is mildly dominant. Normal PICA origins. Vertebrobasilar junction within normal limits. Tortuous basilar artery without stenosis. SCA and left PCA origins within normal limits. Fetal type right PCA origin. Left posterior communicating artery is present. Bilateral PCA branches are within normal limits.  Antegrade flow in both ICA siphons. Tortuous distal cervical right ICA. No siphon stenosis. Ophthalmic and posterior communicating artery origins are within normal limits. Patent carotid termini. MCA and ACA origins are within normal limits.  Normal anterior communicating artery. Tortuous but otherwise negative visualized ACA branches. Visualized right MCA branches are tortuous, otherwise within normal limits. Visualized left MCA branches remarkable for moderate to severe distal M1 segment stenosis (series 109, image 21). Left MCA branches otherwise within normal limits.  IMPRESSION: 1.  No acute intracranial abnormality. 2. Chronic ischemic disease, mostly small vessel type. Mild progression of signal changes since July. Chronic right inferior temporal lobe encephalomalacia. 3. Intracranial artery tortuosity. 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   Electronically  Signed   By: Augusto Gamble M.D.   On: 02/19/2014 11:10   Mri Brain Without Contrast  02/19/2014   CLINICAL DATA:  58 year old male with acute confusion. Initial encounter. Personal history of prior infarcts.  EXAM: MRI HEAD WITHOUT CONTRAST  MRA HEAD WITHOUT CONTRAST  TECHNIQUE: Multiplanar, multiecho pulse sequences of the brain and surrounding structures were obtained without intravenous contrast. Angiographic images of the head were obtained using MRA technique without contrast.  COMPARISON:   Brain MRI 10/09/2013. Head CT without contrast 02/18/2014.  FINDINGS: MRI HEAD FINDINGS  Stable cerebral volume. Major intracranial vascular flow voids are stable. No restricted diffusion or evidence of acute infarction.  No midline shift, mass effect, evidence of mass lesion, ventriculomegaly, extra-axial collection or acute intracranial hemorrhage. Cervicomedullary junction and pituitary are within normal limits.  Interval expected evolution of small dorsal left thalamus and superior cerebellar infarcts. There is a small chronic micro hemorrhage in the right cerebellum on series 7, image 6 with regressed adjacent T2 and FLAIR hyperintensity which may have reflected mild edema previously. Increased nonspecific T2 hyperintensity in the pons greater on the left. Elsewhere stable gray and white matter signal, including left inferior temporal lobe encephalomalacia (series 8, image 22).  Chronic mastoid effusions. Stable internal auditory structures. Negative visualized nasopharynx. No ventriculomegaly. No intracranial mass effect. No acute intracranial hemorrhage identified. Negative pituitary, cervicomedullary junction and visualized cervical spine. Visualized scalp soft tissues are within normal limits. Stable orbits soft tissues. Stable paranasal sinuses. Bone marrow signal within normal limits.  MRA HEAD FINDINGS  Antegrade flow in the posterior circulation with tortuous distal vertebral arteries, the right is mildly dominant. Normal PICA origins. Vertebrobasilar junction within normal limits. Tortuous basilar artery without stenosis. SCA and left PCA origins within normal limits. Fetal type right PCA origin. Left posterior communicating artery is present. Bilateral PCA branches are within normal limits.  Antegrade flow in both ICA siphons. Tortuous distal cervical right ICA. No siphon stenosis. Ophthalmic and posterior communicating artery origins are within normal limits. Patent carotid termini. MCA and ACA  origins are within normal limits.  Normal anterior communicating artery. Tortuous but otherwise negative visualized ACA branches. Visualized right MCA branches are tortuous, otherwise within normal limits. Visualized left MCA branches remarkable for moderate to severe distal M1 segment stenosis (series 109, image 21). Left MCA branches otherwise within normal limits.  IMPRESSION: 1.  No acute intracranial abnormality. 2. Chronic ischemic disease, mostly small vessel type. Mild progression of signal changes since July. Chronic right inferior temporal lobe encephalomalacia. 3. Intracranial artery tortuosity. 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   Electronically Signed   By: Augusto Gamble M.D.   On: 02/19/2014 11:10    Scheduled Meds: . aspirin  325 mg Oral Daily  . cloNIDine  0.3 mg Oral BID  . enoxaparin (LOVENOX) injection  40 mg Subcutaneous QHS  . pravastatin  20 mg Oral q1800   Continuous Infusions: . sodium chloride 125 mL/hr at 02/19/14 0848    Active Problems:   Seizure   Essential hypertension, benign   History of stroke   Confusion   Acute renal insufficiency    Time spent: > 35 minutes    Robert Walls  [facility] Pager [phone]. If 7PM-7AM, please contact night-coverage at [email or web address], password [facility] 02/19/2014, 2:04 PM  LOS: 1 day

## 2014-02-19 NOTE — Care Management Note (Addendum)
    Page 1 of 1   02/20/2014     2:25:08 PM CARE MANAGEMENT NOTE 02/20/2014  Patient:  Robert Walls,Robert Walls   Account Number:  [id number]  Date Initiated:  02/19/2014  Documentation initiated by:  Sharrie RothmanBLACKWELL,Jissel Slavens C  Subjective/Objective Assessment:   Pt admitted from home with confusion and possible seizures. Pt lives with his son and will return home at discharge. Pt is independent with ADLs'. Pt stated that he gets SSI and would get his first check on 02/20/14 and will be able to buy     Action/Plan:   his medications. Will set pt up with [facility] again. Financial counselor is aware of self pay status.   Anticipated DC Date:  02/20/2014   Anticipated DC Plan:  HOME/SELF CARE  In-house referral  Financial Counselor      DC Planning Services  CM consult      Choice offered to / List presented to:             Status of service:  Completed, signed off Medicare Important Message given?   (If response is "NO", the following Medicare IM given date fields will be blank) Date Medicare IM given:   Medicare IM given by:   Date Additional Medicare IM given:   Additional Medicare IM given by:    Discharge Disposition:  HOME/SELF CARE  Per UR Regulation:    If discussed at Long Length of Stay Meetings, dates discussed:    Comments:  02/20/14 1415 Arlyss Queenammy Vera Furniss, RN BSN CM Pt discharged home today. Pt [facility]. Pt stated that he gets his first SSI check today and many of his meds are on the $4 list. CM explained to pt importance of following up with PCP and neuro. Offered to arrange PCP for pt and he does not want me to do that at this time. He stated that he would arrange his own appt. Pt made aware of the [facility] and the requirements.  02/19/14 1130 Arlyss Queenammy Toshio Slusher, RN BSN CM

## 2014-02-19 NOTE — Progress Notes (Signed)
Pt's manual BP 183/120 HR 55. Dr. Cena BentonVega text paged and made aware. Awaiting return page at this time. Will continue to monitor.

## 2014-02-19 NOTE — Consult Note (Signed)
[place] A. Merlene Laughter, MD     [email or web address]          Robert Walls is an 58 y.o. male.   ASSESSMENT/PLAN:  1. Acute confusional state. The etiology could be multifactorial. The patient most likely has been having complex seizures. Acute renal failure along with medication effect may also be causative. The patient is counseled about taking medications from others. The patient has been restarted on his Keppra.  2. Chronic low back pain.  3. 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 Aspirin two 42m/day.    4. Old lacunar infarcts.    The patient is a 58year old male who presents with confusion and altered mental status. The patient was found talking nonsensically and her walking around aimlessly. It appears that he may have had some gait instability. 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 It appears that he has not been taking his Keppra although he was seen in follow-up. From what I can ascertain from the chart, he has had difficulties for the medication. Workup has been mostly unrevealing including imaging for anything acute. He however seem to have acute renal failure with baseline creatinine in the 1.4 range. It's up to 2. Urine toxicology is positive for benzodiazepine and marijuana. He apparently admits to smoking marijuana on Thanksgiving day. He apparently took benzos from a friend. The patient did take a Percocet tablet and also Xanax. He complains of chronic low back pain. The review of systems is otherwise negative.     GENERAL:  He is in no acute distress.  HEENT: Supple. Atraumatic normocephalic.   ABDOMEN: soft  EXTREMITIES: No edema   BACK: Normal.  SKIN: Normal by inspection.    MENTAL STATUS: Alert and oriented. Speech, language and cognition are generally intact. Judgment and insight normal.   CRANIAL NERVES: Pupils are equal, round  and reactive to light and accommodation; extra ocular movements are full, there is no significant nystagmus; visual fields are full; upper and lower facial muscles are normal in strength and symmetric, there is no flattening of the nasolabial folds; tongue is midline; uvula is midline; shoulder elevation is normal.  MOTOR: Normal tone, bulk and strength; no pronator drift.  COORDINATION: Left finger to nose is normal, right finger to nose is normal, No rest tremor; no intention tremor; no postural tremor; no bradykinesia.  REFLEXES: Deep tendon reflexes are symmetrical and normal. Babinski reflexes are flexor bilaterally.   SENSATION: Normal to light touch.    [[[[[[[[[[[[[[[[[[[[[[[[[[[[PRIOR NOTE 09-2013 1. Acute encephalopathy likely due to postictal lethargy. His second etiology of the patient's seizures is not clear at this time. However, she does seem to have right frontal encephalomalacia consistent with an old infarct which could be the substrate of the patient's seizures. Additionally, there is some indication that he may have all call withdrawal seizures. In any situation, I agree with the patient being placed on antiseizure medications. An EEG will be obtained. Seizure precaution is advised. The patient should not operate machinery or drive vehicles. The patient is on Ultram outpatient which can be associated with seizures. It may be a good idea to discontinue this medication.  2. 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 The patient therefore should have a TEE and an event monitor to evaluate for current embolic sources. In the meantime, on antiplatelet agents are recommended. The case is discussed with  the cardiologist Dr. Harl Bowie And also the hospitalist.  3. Alcoholism.  4. Hypertension.   [place] Notes 10/13/13 TEE performed in PACU with sedation provided by Anesthesia service. There were no complications.  Please refer to full TEE report.  In summary, LVEF is normal, no major regurgitant valvular abnormalities noted, no evidence of intracardiac thrombus including evaluation of left and right atrial appendages, no evidence of ASD or PFO by color flow Doppler or agitated saline contrast. There is moderate atherosclerosis noted within the descending aorta which could be a potential contributor to increased stroke risk.  Satira Sark, M.D., F.A.C.C.]]]]]]]]]]]]]]]]]]]]]]]]]]]]]]]]]]]]]]]]]]]]]   Blood pressure 183/120, pulse 62, temperature 98.2 F (36.8 C), temperature source Oral, resp. rate 18, height $RemoveBe'5\' 7"'BUZfeojTn$  (1.702 m), weight 67.7 kg (149 lb 4 oz), SpO2 100 %.  Past Medical History  Diagnosis Date  . Impaired hearing   . Essential hypertension, benign   . Seizures     Dilantin  . Concussion 1993  . Arthritis   . Back pain   . Anxiety   . History of stroke July 2015    Small acute to subacute infarcts - TEE negative for cardioembolic source  . Aortic atherosclerosis     Documented by TEE  . Stroke     Past Surgical History  Procedure Laterality Date  . Inner ear surgery      Childhood  . Back surgery  1993  . Finger surgery  2003    Left ring finger  . Tee without cardioversion N/A 10/13/2013    Procedure: TRANSESOPHAGEAL ECHOCARDIOGRAM (TEE) WITH PROPOFOL;  Surgeon: Satira Sark, MD;  Location: [facility];  Service: Cardiovascular;  Laterality: N/A;    Family History  Problem Relation Age of Onset  . Anesthesia problems Neg Hx   . Hypotension Neg Hx   . Malignant hyperthermia Neg Hx   . Pseudochol deficiency Neg Hx     Social History:  reports that he has been smoking Cigarettes.  He has a 4 pack-year smoking history. He does not have any smokeless tobacco history on file. He reports that he uses illicit drugs (Marijuana). He reports that he does not drink alcohol.  Allergies:  Allergies  Allergen Reactions  . Lisinopril Other (See Comments)    Depleted sodium;  resulted in hospitalization, with the HCTZ     Medications: Prior to Admission medications   Medication Sig Start Date End Date Taking? Authorizing Provider  cloNIDine (CATAPRES) 0.3 MG tablet Take 1 tablet (0.3 mg total) by mouth 2 (two) times daily. 12/11/13  Yes Satira Sark, MD  lovastatin (MEVACOR) 20 MG tablet Take 1 tablet (20 mg total) by mouth at bedtime. 12/11/13  Yes Satira Sark, MD  aspirin EC 81 MG EC tablet Take 1 tablet (81 mg total) by mouth daily. Patient not taking: Reported on 02/18/2014 10/14/13   Samuella Cota, MD  levETIRAcetam (KEPPRA) 500 MG tablet Take 1 tablet (500 mg total) by mouth 2 (two) times daily. 10/13/13   Samuella Cota, MD    Scheduled Meds: . amLODipine  5 mg Oral Daily  . aspirin  325 mg Oral Daily  . cloNIDine  0.3 mg Oral BID  . enoxaparin (LOVENOX) injection  40 mg Subcutaneous QHS  . levETIRAcetam  500 mg Oral BID  . pravastatin  20 mg Oral Q2000   Continuous Infusions: . sodium chloride 125 mL/hr at 02/19/14 0848   PRN Meds:.acetaminophen, hydrALAZINE     Results for orders placed or performed  during the hospital encounter of 02/18/14 (from the past 48 hour(s))  Ethanol     Status: None   Collection Time: 02/18/14  7:04 PM  Result Value Ref Range   Alcohol, Ethyl (B) <11 0 - 11 mg/dL    Comment:        LOWEST DETECTABLE LIMIT FOR SERUM ALCOHOL IS 11 mg/dL FOR MEDICAL PURPOSES ONLY   Protime-INR     Status: None   Collection Time: 02/18/14  7:04 PM  Result Value Ref Range   Prothrombin Time 13.7 11.6 - 15.2 seconds   INR 1.04 0.00 - 1.49  APTT     Status: None   Collection Time: 02/18/14  7:04 PM  Result Value Ref Range   aPTT 32 24 - 37 seconds  CBC     Status: Abnormal   Collection Time: 02/18/14  7:04 PM  Result Value Ref Range   WBC 7.7 4.0 - 10.5 K/uL   RBC 4.04 (L) 4.22 - 5.81 MIL/uL   Hemoglobin 12.9 (L) 13.0 - 17.0 g/dL   HCT 38.7 (L) 39.0 - 52.0 %   MCV 95.8 78.0 - 100.0 fL   MCH 31.9 26.0 - 34.0  pg   MCHC 33.3 30.0 - 36.0 g/dL   RDW 12.5 11.5 - 15.5 %   Platelets 218 150 - 400 K/uL  Differential     Status: None   Collection Time: 02/18/14  7:04 PM  Result Value Ref Range   Neutrophils Relative % 58 43 - 77 %   Neutro Abs 4.6 1.7 - 7.7 K/uL   Lymphocytes Relative 31 12 - 46 %   Lymphs Abs 2.4 0.7 - 4.0 K/uL   Monocytes Relative 7 3 - 12 %   Monocytes Absolute 0.5 0.1 - 1.0 K/uL   Eosinophils Relative 3 0 - 5 %   Eosinophils Absolute 0.2 0.0 - 0.7 K/uL   Basophils Relative 1 0 - 1 %   Basophils Absolute 0.1 0.0 - 0.1 K/uL  Comprehensive metabolic panel     Status: Abnormal   Collection Time: 02/18/14  7:04 PM  Result Value Ref Range   Sodium 140 137 - 147 mEq/L   Potassium 4.1 3.7 - 5.3 mEq/L   Chloride 103 96 - 112 mEq/L   CO2 24 19 - 32 mEq/L   Glucose, Bld 91 70 - 99 mg/dL   BUN 18 6 - 23 mg/dL   Creatinine, Ser 1.91 (H) 0.50 - 1.35 mg/dL   Calcium 8.8 8.4 - 10.5 mg/dL   Total Protein 6.9 6.0 - 8.3 g/dL   Albumin 3.6 3.5 - 5.2 g/dL   AST 13 0 - 37 U/L   ALT 11 0 - 53 U/L   Alkaline Phosphatase 75 39 - 117 U/L   Total Bilirubin 0.3 0.3 - 1.2 mg/dL   GFR calc non Af Amer 38 (L) >90 mL/min   GFR calc Af Amer 44 (L) >90 mL/min    Comment: (NOTE) The eGFR has been calculated using the CKD EPI equation. This calculation has not been validated in all clinical situations. eGFR's persistently <90 mL/min signify possible Chronic Kidney Disease.    Anion gap 13 5 - 15  I-Stat Troponin, ED (not at [facility])     Status: None   Collection Time: 02/18/14  7:13 PM  Result Value Ref Range   Troponin i, poc 0.02 0.00 - 0.08 ng/mL   Comment 3            Comment: Due  to the release kinetics of cTnI, a negative result within the first hours of the onset of symptoms does not rule out myocardial infarction with certainty. If myocardial infarction is still suspected, repeat the test at appropriate intervals.   I-Stat Chem 8, ED     Status: Abnormal   Collection Time: 02/18/14   7:15 PM  Result Value Ref Range   Sodium 141 137 - 147 mEq/L   Potassium 4.0 3.7 - 5.3 mEq/L   Chloride 105 96 - 112 mEq/L   BUN 18 6 - 23 mg/dL   Creatinine, Ser 2.00 (H) 0.50 - 1.35 mg/dL   Glucose, Bld 88 70 - 99 mg/dL   Calcium, Ion 1.16 1.12 - 1.23 mmol/L   TCO2 22 0 - 100 mmol/L   Hemoglobin 13.9 13.0 - 17.0 g/dL   HCT 41.0 39.0 - 52.0 %  Urine Drug Screen     Status: Abnormal   Collection Time: 02/18/14  7:24 PM  Result Value Ref Range   Opiates NONE DETECTED NONE DETECTED   Cocaine NONE DETECTED NONE DETECTED   Benzodiazepines POSITIVE (A) NONE DETECTED   Amphetamines NONE DETECTED NONE DETECTED   Tetrahydrocannabinol POSITIVE (A) NONE DETECTED   Barbiturates NONE DETECTED NONE DETECTED    Comment:        DRUG SCREEN FOR MEDICAL PURPOSES ONLY.  IF CONFIRMATION IS NEEDED FOR ANY PURPOSE, NOTIFY LAB WITHIN 5 DAYS.        LOWEST DETECTABLE LIMITS FOR URINE DRUG SCREEN Drug Class       Cutoff (ng/mL) Amphetamine      1000 Barbiturate      200 Benzodiazepine   053 Tricyclics       976 Opiates          300 Cocaine          300 THC              50   Urinalysis, Routine w reflex microscopic     Status: Abnormal   Collection Time: 02/18/14  7:24 PM  Result Value Ref Range   Color, Urine YELLOW YELLOW   APPearance CLEAR CLEAR   Specific Gravity, Urine 1.020 1.005 - 1.030   pH 5.5 5.0 - 8.0   Glucose, UA NEGATIVE NEGATIVE mg/dL   Hgb urine dipstick LARGE (A) NEGATIVE   Bilirubin Urine NEGATIVE NEGATIVE   Ketones, ur NEGATIVE NEGATIVE mg/dL   Protein, ur NEGATIVE NEGATIVE mg/dL   Urobilinogen, UA 0.2 0.0 - 1.0 mg/dL   Nitrite NEGATIVE NEGATIVE   Leukocytes, UA NEGATIVE NEGATIVE  Urine microscopic-add on     Status: Abnormal   Collection Time: 02/18/14  7:24 PM  Result Value Ref Range   RBC / HPF 11-20 <3 RBC/hpf   Casts HYALINE CASTS (A) NEGATIVE  Fasting lipid panel     Status: Abnormal   Collection Time: 02/19/14  6:34 AM  Result Value Ref Range    Cholesterol 140 0 - 200 mg/dL   Triglycerides 123 <150 mg/dL   HDL 39 (L) >39 mg/dL   Total CHOL/HDL Ratio 3.6 RATIO   VLDL 25 0 - 40 mg/dL   LDL Cholesterol 76 0 - 99 mg/dL    Comment:        Total Cholesterol/HDL:CHD Risk Coronary Heart Disease Risk Table                     Men   Women  1/2 Average Risk   3.4   3.3  Average  Risk       5.0   4.4  2 X Average Risk   9.6   7.1  3 X Average Risk  23.4   11.0        Use the calculated Patient Ratio above and the CHD Risk Table to determine the patient's CHD Risk.        ATP III CLASSIFICATION (LDL):  <100     mg/dL   Optimal  100-129  mg/dL   Near or Above                    Optimal  130-159  mg/dL   Borderline  160-189  mg/dL   High  >190     mg/dL   Very High     Studies/Results:  BRAN MRI/MRA CLINICAL DATA: 58 year old male with acute confusion. Initial encounter. Personal history of prior infarcts.  EXAM: MRI HEAD WITHOUT CONTRAST  MRA HEAD WITHOUT CONTRAST  TECHNIQUE: Multiplanar, multiecho pulse sequences of the brain and surrounding structures were obtained without intravenous contrast. Angiographic images of the head were obtained using MRA technique without contrast.  COMPARISON: Brain MRI 10/09/2013. Head CT without contrast 02/18/2014.  FINDINGS: MRI HEAD FINDINGS  Stable cerebral volume. Major intracranial vascular flow voids are stable. No restricted diffusion or evidence of acute infarction.  No midline shift, mass effect, evidence of mass lesion, ventriculomegaly, extra-axial collection or acute intracranial hemorrhage. Cervicomedullary junction and pituitary are within normal limits.  Interval expected evolution of small dorsal left thalamus and superior cerebellar infarcts. There is a small chronic micro hemorrhage in the right cerebellum on series 7, image 6 with regressed adjacent T2 and FLAIR hyperintensity which may have reflected mild edema previously. Increased nonspecific  T2 hyperintensity in the pons greater on the left. Elsewhere stable gray and white matter signal, including left inferior temporal lobe encephalomalacia (series 8, image 22).  Chronic mastoid effusions. Stable internal auditory structures. Negative visualized nasopharynx. No ventriculomegaly. No intracranial mass effect. No acute intracranial hemorrhage identified. Negative pituitary, cervicomedullary junction and visualized cervical spine. Visualized scalp soft tissues are within normal limits. Stable orbits soft tissues. Stable paranasal sinuses. Bone marrow signal within normal limits.  MRA HEAD FINDINGS  Antegrade flow in the posterior circulation with tortuous distal vertebral arteries, the right is mildly dominant. Normal PICA origins. Vertebrobasilar junction within normal limits. Tortuous basilar artery without stenosis. SCA and left PCA origins within normal limits. Fetal type right PCA origin. Left posterior communicating artery is present. Bilateral PCA branches are within normal limits.  Antegrade flow in both ICA siphons. Tortuous distal cervical right ICA. No siphon stenosis. Ophthalmic and posterior communicating artery origins are within normal limits. Patent carotid termini. MCA and ACA origins are within normal limits.  Normal anterior communicating artery. Tortuous but otherwise negative visualized ACA branches. Visualized right MCA branches are tortuous, otherwise within normal limits. Visualized left MCA branches remarkable for moderate to severe distal M1 segment stenosis (series 109, image 21). Left MCA branches otherwise within normal limits.  IMPRESSION: 1. No acute intracranial abnormality. 2. Chronic ischemic disease, mostly small vessel type. Mild progression of signal changes since July. Chronic right inferior temporal lobe encephalomalacia. 3. Intracranial artery tortuosity. 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     The brain MRI is reviewed in person. There is mild deep matter and confluent leukoencephalopathy. There is again mild to moderate encephalomalacia involving the medial  temporal region suggests an old infarct. There is a tiny hemosiderin deposit involving the cerebellum inferior portion on the right side.  No acute infarcts are observed.   Auria Mckinlay A. Merlene Laughter, M.D.  Diplomate, Tax adviser of Psychiatry and Neurology ( Neurology). 02/19/2014, 6:15 PM

## 2014-02-20 ENCOUNTER — Observation Stay (HOSPITAL_COMMUNITY)
Admission: EM | Admit: 2014-02-20 | Discharge: 2014-02-20 | Disposition: A | Discharge status: Home / Self Care | Payer: Medicaid Other | Source: Home / Self Care | Attending: Neurology | Admitting: Neurology

## 2014-02-20 ENCOUNTER — Observation Stay (HOSPITAL_COMMUNITY): Payer: Medicaid Other

## 2014-02-20 DIAGNOSIS — R410 Disorientation, unspecified: Secondary | ICD-10-CM | POA: Diagnosis not present

## 2014-02-20 DIAGNOSIS — Z8673 Personal history of transient ischemic attack (TIA), and cerebral infarction without residual deficits: Secondary | ICD-10-CM | POA: Diagnosis not present

## 2014-02-20 DIAGNOSIS — I10 Essential (primary) hypertension: Secondary | ICD-10-CM | POA: Diagnosis not present

## 2014-02-20 LAB — BASIC METABOLIC PANEL
ANION GAP: 9 (ref 5–15)
BUN: 14 mg/dL (ref 6–23)
CHLORIDE: 106 meq/L (ref 96–112)
CO2: 28 meq/L (ref 19–32)
Calcium: 8.7 mg/dL (ref 8.4–10.5)
Creatinine, Ser: 1.38 mg/dL — ABNORMAL HIGH (ref 0.50–1.35)
GFR calc Af Amer: 65 mL/min — ABNORMAL LOW (ref >90)
GFR calc non Af Amer: 56 mL/min — ABNORMAL LOW (ref >90)
GLUCOSE: 88 mg/dL (ref 70–99)
POTASSIUM: 3.9 meq/L (ref 3.7–5.3)
SODIUM: 143 meq/L (ref 137–147)

## 2014-02-20 MED ORDER — AMLODIPINE BESYLATE 10 MG PO TABS: 10.0000 mg | ORAL_TABLET | Freq: Every day | ORAL | Status: DC

## 2014-02-20 MED ORDER — LEVETIRACETAM 500 MG PO TABS: 500.0000 mg | ORAL_TABLET | Freq: Two times a day (BID) | ORAL | Status: DC

## 2014-02-20 MED ORDER — ASPIRIN 162 MG PO TBEC: 162.0000 mg | DELAYED_RELEASE_TABLET | Freq: Every day | ORAL | Status: DC

## 2014-02-20 MED ORDER — AMLODIPINE BESYLATE 5 MG PO TABS
10.0000 mg | ORAL_TABLET | Freq: Every day | ORAL | Status: DC
  Administered 2014-02-20: 10 mg via ORAL
  Filled 2014-02-20: qty 2

## 2014-02-20 MED ORDER — ASPIRIN EC 81 MG PO TBEC
162.0000 mg | DELAYED_RELEASE_TABLET | Freq: Every day | ORAL | Status: DC
  Administered 2014-02-20: 162 mg via ORAL
  Filled 2014-02-20: qty 2

## 2014-02-20 NOTE — Progress Notes (Signed)
Pt reported that he doesn't currently have a PCP at this time & would be looking into obtaining a PCP upon D/C in order to f/u on renal US results as ordered by the D/C physician. Pt aware to f/u.

## 2014-02-20 NOTE — Progress Notes (Signed)
1500 Pt d/c home as ordered, exited out of facility via w/c by staff & has a ride with family. Pt instructed not to drive until seen by neurologist as ordered by the MD. IV site removed, catheter intact, pt tolerated well, no s/s of infection noted at this time. Pt able to dress himself & assisted with gathering his belongings prior to d/c.

## 2014-02-20 NOTE — Progress Notes (Signed)
EEG Completed; Results Pending  

## 2014-02-20 NOTE — Discharge Summary (Signed)
Physician Discharge Summary  Robert Walls:[id number] DOB: 1955/07/06 DOA: 02/18/2014  PCP: No PCP Per Patient  Admit date: 02/18/2014 Discharge date: 02/20/2014  Time spent: > 35 minutes  Recommendations for Outpatient Follow-up:  U/S of renal kidneys reported: Complicated cystic lesions in both kidneys ; further characterization of these lesions by MR imaging with and without 1. contrast recommended to exclude cystic renal neoplasm. 2.   Discharge Diagnoses:  Active Problems:   Seizure   Essential hypertension, benign   History of stroke   Confusion   Acute on chronic renal insufficiency   Discharge Condition: stable  Diet recommendation: heart healthy/low sodium  Filed Weights   02/18/14 1827 02/18/14 2224  Weight: 71.215 kg (157 lb) 67.7 kg (149 lb 4 oz)    History of present illness:  Patient is a 58 year old with history of CVA and seizures. Stop taking his seizure medication and developed confusion. The patient presented to the ED for evaluation.  Hospital Course:  1. Confusion - No new stroke on MRI of brain - Neurology evaluated patient. Recommended continuing Keppra. At this juncture most likely cause of confusion is break through seizure activity - Patient was counseled on not taking medication he should not be on as reportedly he took some Xanax prior to presentation  2. Seizure - Will restart Keppra - Neurology on board - May be contributing to principle problem  3. HTN - Not well controlled we'll continue clonidine - added amlodipine given elevated BP values  4. Acute on chronic kidney disease stage IV - Improved with IV fluid rehydration - Ultrasound kidney reports complicated cystic lesions in both kidneys. Follow-up MRI recommended  Procedures:  Please see list below  Consultations:  Neurology  Discharge Exam: Filed Vitals:   02/20/14 0649  BP: 189/87  Pulse: 60  Temp: 98.2 F (36.8 C)  Resp: 18    General: Pt in nad, alert  and awake Cardiovascular: rrr, no mrg Respiratory: cta bl, no wheezes  Discharge Instructions You were cared for by a hospitalist during your hospital stay. If you have any questions about your discharge medications or the care you received while you were in the hospital after you are discharged, you can call the unit and asked to speak with the hospitalist on call if the hospitalist that took care of you is not available. Once you are discharged, your primary care physician will handle any further medical issues. Please note that NO REFILLS for any discharge medications will be authorized once you are discharged, as it is imperative that you return to your primary care physician (or establish a relationship with a primary care physician if you do not have one) for your aftercare needs so that they can reassess your need for medications and monitor your lab values.  Discharge Instructions    Call MD for:  extreme fatigue    Complete by:  As directed      Diet - low sodium heart healthy    Complete by:  As directed      Driving Restrictions    Complete by:  As directed   May not drive until cleared by his neurologist     Increase activity slowly    Complete by:  As directed      Other Restrictions    Complete by:  As directed   May not go swimming in lakes or taking baths          Current Discharge Medication List    START taking  these medications   Details  amLODipine (NORVASC) 10 MG tablet Take 1 tablet (10 mg total) by mouth daily. Qty: 30 tablet, Refills: 0      CONTINUE these medications which have CHANGED   Details  aspirin EC 162 MG EC tablet Take 1 tablet (162 mg total) by mouth daily. Qty: 60 tablet, Refills: 0    levETIRAcetam (KEPPRA) 500 MG tablet Take 1 tablet (500 mg total) by mouth 2 (two) times daily. Qty: 60 tablet, Refills: 0      CONTINUE these medications which have NOT CHANGED   Details  cloNIDine (CATAPRES) 0.3 MG tablet Take 1 tablet (0.3 mg total) by  mouth 2 (two) times daily. Qty: 60 tablet, Refills: 6    lovastatin (MEVACOR) 20 MG tablet Take 1 tablet (20 mg total) by mouth at bedtime. Qty: 30 tablet, Refills: 6       Allergies  Allergen Reactions  . Lisinopril Other (See Comments)    Depleted sodium; resulted in hospitalization, with the HCTZ       The results of significant diagnostics from this hospitalization (including imaging, microbiology, ancillary and laboratory) are listed below for reference.    Significant Diagnostic Studies: Dg Chest 2 View  02/18/2014   CLINICAL DATA:  Altered mental status.  Confusion.  EXAM: CHEST  2 VIEW  COMPARISON:  06/10/2011  FINDINGS: Heart size and pulmonary vascularity are normal and the lungs are clear. No acute osseous abnormality. No effusions. Harrington rods in the lumbar spine.  IMPRESSION: No active cardiopulmonary disease.   Electronically Signed   By: Geanie CooleyJim  Maxwell M.D.   On: 02/18/2014 20:07   Ct Head Wo Contrast  02/18/2014   CLINICAL DATA:  Initial evaluation for acute altered mental status.  EXAM: CT HEAD WITHOUT CONTRAST  TECHNIQUE: Contiguous axial images were obtained from the base of the skull through the vertex without intravenous contrast.  COMPARISON:  Prior head CT from 10/09/2013.  FINDINGS: There is no acute intracranial hemorrhage or infarct. No mass lesion or midline shift. Gray-white matter differentiation is well maintained. Ventricles are normal in size without evidence of hydrocephalus. Right temporal lobe encephalomalacia again noted, stable. No extra-axial fluid collection.  The calvarium is intact.  Orbital soft tissues are within normal limits.  The paranasal sinuses are well pneumatized and free of fluid. Mastoid air cells are largely opacified bilaterally, stable from prior.  Scalp soft tissues are unremarkable.  IMPRESSION: 1. No acute intracranial abnormality. 2. Complete opacification of the mastoid air cells bilaterally, which appears chronic in nature, and  is similar to prior study.   Electronically Signed   By: Rise MuBenjamin  McClintock M.D.   On: 02/18/2014 19:10   Mr [name] Head Wo Contrast  02/19/2014   CLINICAL DATA:  58 year old male with acute confusion. Initial encounter. Personal history of prior infarcts.  EXAM: MRI HEAD WITHOUT CONTRAST  MRA HEAD WITHOUT CONTRAST  TECHNIQUE: Multiplanar, multiecho pulse sequences of the brain and surrounding structures were obtained without intravenous contrast. Angiographic images of the head were obtained using MRA technique without contrast.  COMPARISON:  Brain MRI 10/09/2013. Head CT without contrast 02/18/2014.  FINDINGS: MRI HEAD FINDINGS  Stable cerebral volume. Major intracranial vascular flow voids are stable. No restricted diffusion or evidence of acute infarction.  No midline shift, mass effect, evidence of mass lesion, ventriculomegaly, extra-axial collection or acute intracranial hemorrhage. Cervicomedullary junction and pituitary are within normal limits.  Interval expected evolution of small dorsal left thalamus and superior cerebellar infarcts. There is a  small chronic micro hemorrhage in the right cerebellum on series 7, image 6 with regressed adjacent T2 and FLAIR hyperintensity which may have reflected mild edema previously. Increased nonspecific T2 hyperintensity in the pons greater on the left. Elsewhere stable gray and white matter signal, including left inferior temporal lobe encephalomalacia (series 8, image 22).  Chronic mastoid effusions. Stable internal auditory structures. Negative visualized nasopharynx. No ventriculomegaly. No intracranial mass effect. No acute intracranial hemorrhage identified. Negative pituitary, cervicomedullary junction and visualized cervical spine. Visualized scalp soft tissues are within normal limits. Stable orbits soft tissues. Stable paranasal sinuses. Bone marrow signal within normal limits.  MRA HEAD FINDINGS  Antegrade flow in the posterior circulation with tortuous  distal vertebral arteries, the right is mildly dominant. Normal PICA origins. Vertebrobasilar junction within normal limits. Tortuous basilar artery without stenosis. SCA and left PCA origins within normal limits. Fetal type right PCA origin. Left posterior communicating artery is present. Bilateral PCA branches are within normal limits.  Antegrade flow in both ICA siphons. Tortuous distal cervical right ICA. No siphon stenosis. Ophthalmic and posterior communicating artery origins are within normal limits. Patent carotid termini. MCA and ACA origins are within normal limits.  Normal anterior communicating artery. Tortuous but otherwise negative visualized ACA branches. Visualized right MCA branches are tortuous, otherwise within normal limits. Visualized left MCA branches remarkable for moderate to severe distal M1 segment stenosis (series 109, image 21). Left MCA branches otherwise within normal limits.  IMPRESSION: 1.  No acute intracranial abnormality. 2. Chronic ischemic disease, mostly small vessel type. Mild progression of signal changes since July. Chronic right inferior temporal lobe encephalomalacia. 3. Intracranial artery tortuosity. 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   Electronically Signed   By: Augusto GambleLee  Hall M.D.   On: 02/19/2014 11:10   Mri Brain Without Contrast  02/19/2014   CLINICAL DATA:  58 year old male with acute confusion. Initial encounter. Personal history of prior infarcts.  EXAM: MRI HEAD WITHOUT CONTRAST  MRA HEAD WITHOUT CONTRAST  TECHNIQUE: Multiplanar, multiecho pulse sequences of the brain and surrounding structures were obtained without intravenous contrast. Angiographic images of the head were obtained using MRA technique without contrast.  COMPARISON:  Brain MRI 10/09/2013. Head CT without contrast 02/18/2014.  FINDINGS: MRI HEAD FINDINGS  Stable cerebral volume. Major  intracranial vascular flow voids are stable. No restricted diffusion or evidence of acute infarction.  No midline shift, mass effect, evidence of mass lesion, ventriculomegaly, extra-axial collection or acute intracranial hemorrhage. Cervicomedullary junction and pituitary are within normal limits.  Interval expected evolution of small dorsal left thalamus and superior cerebellar infarcts. There is a small chronic micro hemorrhage in the right cerebellum on series 7, image 6 with regressed adjacent T2 and FLAIR hyperintensity which may have reflected mild edema previously. Increased nonspecific T2 hyperintensity in the pons greater on the left. Elsewhere stable gray and white matter signal, including left inferior temporal lobe encephalomalacia (series 8, image 22).  Chronic mastoid effusions. Stable internal auditory structures. Negative visualized nasopharynx. No ventriculomegaly. No intracranial mass effect. No acute intracranial hemorrhage identified. Negative pituitary, cervicomedullary junction and visualized cervical spine. Visualized scalp soft tissues are within normal limits. Stable orbits soft tissues. Stable paranasal sinuses. Bone marrow signal within normal limits.  MRA HEAD FINDINGS  Antegrade flow in the posterior circulation with tortuous distal vertebral arteries, the right is mildly dominant. Normal PICA origins. Vertebrobasilar junction within normal limits. Tortuous basilar artery without  stenosis. SCA and left PCA origins within normal limits. Fetal type right PCA origin. Left posterior communicating artery is present. Bilateral PCA branches are within normal limits.  Antegrade flow in both ICA siphons. Tortuous distal cervical right ICA. No siphon stenosis. Ophthalmic and posterior communicating artery origins are within normal limits. Patent carotid termini. MCA and ACA origins are within normal limits.  Normal anterior communicating artery. Tortuous but otherwise negative visualized ACA  branches. Visualized right MCA branches are tortuous, otherwise within normal limits. Visualized left MCA branches remarkable for moderate to severe distal M1 segment stenosis (series 109, image 21). Left MCA branches otherwise within normal limits.  IMPRESSION: 1.  No acute intracranial abnormality. 2. Chronic ischemic disease, mostly small vessel type. Mild progression of signal changes since July. Chronic right inferior temporal lobe encephalomalacia. 3. Intracranial artery tortuosity. 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   Electronically Signed   By: Augusto Gamble M.D.   On: 02/19/2014 11:10   US Renal  02/20/2014   CLINICAL DATA:  Elevated creatinine, history essential benign hypertension, smoker  EXAM: RENAL/URINARY TRACT ULTRASOUND COMPLETE  COMPARISON:  None  FINDINGS: Right Kidney:  Length: 13.1 cm. Normal cortical thickness. Increased cortical echogenicity. Complex cystic lesion mid RIGHT kidney 1.4 x 1.3 x 1.2 cm, containing a septation and scattered internal echoes. Larger mildly complicated cystic lesion with irregular wall at inferior pole 3.8 x 3.0 x 3.8 cm. No hydronephrosis or shadowing calcifications.  Left Kidney:  Length: 12.4 cm. Normal cortical thickness. Increased cortical echogenicity. Complex cystic lesion at upper pole containing a septation and slightly irregular wall, 4.8 x 3.4 x 3.8 cm. Few echogenic foci without shadowing at upper pole, nonspecific. No definite hydronephrosis, additional mass or perinephric fluid.  Bladder:  Question slight bladder wall thickening. BILATERAL ureteral jets noted.  IMPRESSION: Medical renal disease changes of both kidneys.  Complicated cystic lesions in both kidneys ; further characterization of these lesions by MR imaging with and without contrast recommended to exclude cystic renal neoplasm.   Electronically Signed   By: Ulyses Southward M.D.   On: 02/20/2014  10:50    Microbiology: No results found for this or any previous visit (from the past 240 hour(s)).   Labs: Basic Metabolic Panel:  Recent Labs Lab 02/18/14 1904 02/18/14 1915 02/20/14 1035  NA 140 141 143  K 4.1 4.0 3.9  CL 103 105 106  CO2 24  --  28  GLUCOSE 91 88 88  BUN 18 18 14   CREATININE 1.91* 2.00* 1.38*  CALCIUM 8.8  --  8.7   Liver Function Tests:  Recent Labs Lab 02/18/14 1904  AST 13  ALT 11  ALKPHOS 75  BILITOT 0.3  PROT 6.9  ALBUMIN 3.6   No results for input(s): LIPASE, AMYLASE in the last 168 hours. No results for input(s): AMMONIA in the last 168 hours. CBC:  Recent Labs Lab 02/18/14 1904 02/18/14 1915  WBC 7.7  --   NEUTROABS 4.6  --   HGB 12.9* 13.9  HCT 38.7* 41.0  MCV 95.8  --   PLT 218  --    Cardiac Enzymes: No results for input(s): CKTOTAL, CKMB, CKMBINDEX, TROPONINI in the last 168 hours. BNP: BNP (last 3 results) No results for input(s): PROBNP in the last 8760 hours. CBG: No results for input(s): GLUCAP in the last 168 hours.     Signed:  Penny Pia  [facility] 02/20/2014, 2:03 PM

## 2014-02-20 NOTE — Progress Notes (Signed)
UR completed 

## 2014-02-21 NOTE — Procedures (Signed)
  [facility] [place] Hijazi A. Gerilyn Pilgrimoonquah, MD     [email or web address]           HISTORY: The patient is a 58 year old man who presents with seizures, altered mental status and acute confusion.  MEDICATIONS: Scheduled Meds: Continuous Infusions: PRN Meds:.    Prior to Admission medications   Medication Sig Start Date End Date Taking? Authorizing Provider  amLODipine (NORVASC) 10 MG tablet Take 1 tablet (10 mg total) by mouth daily. 02/20/14   Penny Piarlando Vega, MD  aspirin EC 162 MG EC tablet Take 1 tablet (162 mg total) by mouth daily. 02/20/14   Penny Piarlando Vega, MD  cloNIDine (CATAPRES) 0.3 MG tablet Take 1 tablet (0.3 mg total) by mouth 2 (two) times daily. 12/11/13   Jonelle SidleSamuel G McDowell, MD  levETIRAcetam (KEPPRA) 500 MG tablet Take 1 tablet (500 mg total) by mouth 2 (two) times daily. 02/20/14   Penny Piarlando Vega, MD  lovastatin (MEVACOR) 20 MG tablet Take 1 tablet (20 mg total) by mouth at bedtime. 12/11/13   Jonelle SidleSamuel G McDowell, MD      ANALYSIS: A 16 channel recording using standard 10 20 measurements is conducted for 22 minutes. There is a posterior dominant rhythm of 10 Hz which attenuates with eye opening. There is beta activity observed in the frontal areas. Photic simulation and hyperventilation were not carried out. There are no focal or lateral slowing. There is no epileptiform activity observed.   IMPRESSION: 1. This is a normal recording awake and drowsy states.      Brittin Belnap A. Gerilyn Pilgrimoonquah, M.D.  Diplomate, Biomedical engineerAmerican Board of Psychiatry and Neurology ( Neurology).

## 2014-04-05 ENCOUNTER — Encounter (HOSPITAL_COMMUNITY): Payer: Self-pay | Admitting: Cardiology

## 2014-07-12 ENCOUNTER — Other Ambulatory Visit: Payer: Self-pay | Admitting: *Deleted

## 2014-07-12 MED ORDER — CLONIDINE HCL 0.3 MG PO TABS: 0.3000 mg | ORAL_TABLET | Freq: Two times a day (BID) | ORAL | Status: DC

## 2014-11-29 ENCOUNTER — Ambulatory Visit (INDEPENDENT_AMBULATORY_CARE_PROVIDER_SITE_OTHER): Payer: Medicaid Other | Admitting: Otolaryngology

## 2014-12-27 ENCOUNTER — Other Ambulatory Visit: Payer: Self-pay | Admitting: Neurosurgery

## 2014-12-27 DIAGNOSIS — M5416 Radiculopathy, lumbar region: Secondary | ICD-10-CM

## 2014-12-27 DIAGNOSIS — M5412 Radiculopathy, cervical region: Secondary | ICD-10-CM

## 2015-01-04 ENCOUNTER — Ambulatory Visit
Admission: RE | Admit: 2015-01-04 | Discharge: 2015-01-04 | Disposition: A | Discharge status: Home / Self Care | Payer: Medicaid Other | Source: Ambulatory Visit | Attending: Neurosurgery | Admitting: Neurosurgery

## 2015-01-04 DIAGNOSIS — M5412 Radiculopathy, cervical region: Secondary | ICD-10-CM

## 2015-01-04 DIAGNOSIS — M5416 Radiculopathy, lumbar region: Secondary | ICD-10-CM

## 2015-01-04 MED ORDER — DIAZEPAM 5 MG PO TABS
10.0000 mg | ORAL_TABLET | Freq: Once | ORAL | Status: AC
  Administered 2015-01-04: 10 mg via ORAL

## 2015-01-04 MED ORDER — IOHEXOL 300 MG/ML  SOLN
10.0000 mL | Freq: Once | INTRAMUSCULAR | Status: DC | PRN
  Administered 2015-01-04: 10 mL via INTRATHECAL

## 2015-01-04 MED ORDER — DIPHENHYDRAMINE HCL 50 MG PO CAPS
50.0000 mg | ORAL_CAPSULE | Freq: Once | ORAL | Status: AC
  Administered 2015-01-04: 50 mg via ORAL

## 2015-01-04 MED ORDER — HYDROXYZINE HCL 50 MG/ML IM SOLN
25.0000 mg | Freq: Once | INTRAMUSCULAR | Status: AC
  Administered 2015-01-04: 25 mg via INTRAMUSCULAR

## 2015-01-04 MED ORDER — MEPERIDINE HCL 100 MG/ML IJ SOLN
75.0000 mg | Freq: Once | INTRAMUSCULAR | Status: AC
  Administered 2015-01-04: 75 mg via INTRAMUSCULAR

## 2015-01-04 NOTE — Discharge Instructions (Signed)
Myelogram Discharge Instructions  1. Go home and rest quietly for the next 24 hours.  It is important to lie flat for the next 24 hours.  Get up only to go to the restroom.  You may lie in the bed or on a couch on your back, your stomach, your left side or your right side.  You may have one pillow under your head.  You may have pillows between your knees while you are on your side or under your knees while you are on your back.  2. DO NOT drive today.  Recline the seat as far back as it will go, while still wearing your seat belt, on the way home.  3. You may get up to go to the bathroom as needed.  You may sit up for 10 minutes to eat.  You may resume your normal diet and medications unless otherwise indicated.  Drink plenty of extra fluids today and tomorrow.  4. The incidence of a spinal headache with nausea and/or vomiting is about 5% (one in 20 patients).  If you develop a headache, lie flat and drink plenty of fluids until the headache goes away.  Caffeinated beverages may be helpful.  If you develop severe nausea and vomiting or a headache that does not go away with flat bed rest, call [phone].  5. You may resume normal activities after your 24 hours of bed rest is over; however, do not exert yourself strongly or do any heavy lifting tomorrow.  6. Call your physician for a follow-up appointment.   You may resume Tramadol on Saturday, January 05, 2015 after 11:00a.m.

## 2015-01-04 NOTE — Progress Notes (Signed)
Patient states he has been off Tramadol for the past two days.  jkl 

## 2016-01-22 DIAGNOSIS — F419 Anxiety disorder, unspecified: Secondary | ICD-10-CM | POA: Diagnosis not present

## 2016-01-22 DIAGNOSIS — Z79891 Long term (current) use of opiate analgesic: Secondary | ICD-10-CM | POA: Diagnosis not present

## 2016-01-22 DIAGNOSIS — R252 Cramp and spasm: Secondary | ICD-10-CM | POA: Diagnosis not present

## 2016-01-22 DIAGNOSIS — I10 Essential (primary) hypertension: Secondary | ICD-10-CM | POA: Diagnosis not present

## 2016-01-22 DIAGNOSIS — M5416 Radiculopathy, lumbar region: Secondary | ICD-10-CM | POA: Diagnosis not present

## 2016-01-22 DIAGNOSIS — M545 Low back pain: Secondary | ICD-10-CM | POA: Diagnosis not present

## 2016-01-22 DIAGNOSIS — I6990 Unspecified sequelae of unspecified cerebrovascular disease: Secondary | ICD-10-CM | POA: Diagnosis not present

## 2016-01-22 DIAGNOSIS — G4089 Other seizures: Secondary | ICD-10-CM | POA: Diagnosis not present

## 2016-08-02 DIAGNOSIS — T148XXA Other injury of unspecified body region, initial encounter: Secondary | ICD-10-CM | POA: Diagnosis not present

## 2016-08-02 DIAGNOSIS — S51812A Laceration without foreign body of left forearm, initial encounter: Secondary | ICD-10-CM | POA: Diagnosis not present

## 2016-08-02 DIAGNOSIS — S0081XA Abrasion of other part of head, initial encounter: Secondary | ICD-10-CM | POA: Diagnosis not present

## 2016-08-02 DIAGNOSIS — S51811A Laceration without foreign body of right forearm, initial encounter: Secondary | ICD-10-CM | POA: Diagnosis not present

## 2016-08-02 DIAGNOSIS — S41109A Unspecified open wound of unspecified upper arm, initial encounter: Secondary | ICD-10-CM | POA: Diagnosis not present

## 2016-08-02 DIAGNOSIS — M542 Cervicalgia: Secondary | ICD-10-CM | POA: Diagnosis not present

## 2016-08-02 DIAGNOSIS — W268XXA Contact with other sharp object(s), not elsewhere classified, initial encounter: Secondary | ICD-10-CM | POA: Diagnosis not present

## 2016-12-03 ENCOUNTER — Emergency Department (HOSPITAL_COMMUNITY)
Admission: EM | Admit: 2016-12-03 | Discharge: 2016-12-04 | Disposition: A | Discharge status: Other Acute Inpatient Hospital | Payer: Medicare Other | Attending: Emergency Medicine | Admitting: Emergency Medicine

## 2016-12-03 ENCOUNTER — Encounter (HOSPITAL_COMMUNITY): Payer: Self-pay

## 2016-12-03 DIAGNOSIS — N289 Disorder of kidney and ureter, unspecified: Secondary | ICD-10-CM | POA: Diagnosis not present

## 2016-12-03 DIAGNOSIS — F17210 Nicotine dependence, cigarettes, uncomplicated: Secondary | ICD-10-CM | POA: Diagnosis not present

## 2016-12-03 DIAGNOSIS — Z7982 Long term (current) use of aspirin: Secondary | ICD-10-CM | POA: Insufficient documentation

## 2016-12-03 DIAGNOSIS — Z79899 Other long term (current) drug therapy: Secondary | ICD-10-CM | POA: Insufficient documentation

## 2016-12-03 DIAGNOSIS — Z8673 Personal history of transient ischemic attack (TIA), and cerebral infarction without residual deficits: Secondary | ICD-10-CM | POA: Diagnosis not present

## 2016-12-03 DIAGNOSIS — E871 Hypo-osmolality and hyponatremia: Secondary | ICD-10-CM | POA: Diagnosis not present

## 2016-12-03 DIAGNOSIS — R45851 Suicidal ideations: Secondary | ICD-10-CM | POA: Diagnosis not present

## 2016-12-03 DIAGNOSIS — F1910 Other psychoactive substance abuse, uncomplicated: Secondary | ICD-10-CM | POA: Diagnosis not present

## 2016-12-03 DIAGNOSIS — I10 Essential (primary) hypertension: Secondary | ICD-10-CM | POA: Insufficient documentation

## 2016-12-03 DIAGNOSIS — F1994 Other psychoactive substance use, unspecified with psychoactive substance-induced mood disorder: Secondary | ICD-10-CM

## 2016-12-03 DIAGNOSIS — Z008 Encounter for other general examination: Secondary | ICD-10-CM | POA: Insufficient documentation

## 2016-12-03 DIAGNOSIS — E876 Hypokalemia: Secondary | ICD-10-CM | POA: Diagnosis not present

## 2016-12-03 DIAGNOSIS — F323 Major depressive disorder, single episode, severe with psychotic features: Secondary | ICD-10-CM

## 2016-12-03 DIAGNOSIS — N2889 Other specified disorders of kidney and ureter: Secondary | ICD-10-CM | POA: Diagnosis not present

## 2016-12-03 HISTORY — DX: Other psychoactive substance abuse, uncomplicated: F19.10

## 2016-12-03 NOTE — ED Triage Notes (Signed)
Pt here with his daughter who reports patient told her he is "ready to get off of all drugs"  Daughter states she [place] to [place] facility [place] who told her they would possibly take the patient after he was medically cleared.

## 2016-12-04 ENCOUNTER — Encounter (HOSPITAL_COMMUNITY): Payer: Self-pay

## 2016-12-04 ENCOUNTER — Inpatient Hospital Stay (HOSPITAL_COMMUNITY)
Admission: AD | Admit: 2016-12-04 | Discharge: 2016-12-09 | DRG: 897 | Disposition: A | Discharge status: Home / Self Care | Payer: Medicare Other | Source: Intra-hospital | Attending: Psychiatry | Admitting: Psychiatry

## 2016-12-04 ENCOUNTER — Encounter (HOSPITAL_COMMUNITY): Payer: Self-pay | Admitting: Registered Nurse

## 2016-12-04 DIAGNOSIS — E871 Hypo-osmolality and hyponatremia: Secondary | ICD-10-CM | POA: Diagnosis not present

## 2016-12-04 DIAGNOSIS — F1310 Sedative, hypnotic or anxiolytic abuse, uncomplicated: Secondary | ICD-10-CM | POA: Diagnosis not present

## 2016-12-04 DIAGNOSIS — G4700 Insomnia, unspecified: Secondary | ICD-10-CM | POA: Diagnosis not present

## 2016-12-04 DIAGNOSIS — R569 Unspecified convulsions: Secondary | ICD-10-CM | POA: Diagnosis present

## 2016-12-04 DIAGNOSIS — Z590 Homelessness: Secondary | ICD-10-CM

## 2016-12-04 DIAGNOSIS — F1410 Cocaine abuse, uncomplicated: Secondary | ICD-10-CM | POA: Diagnosis not present

## 2016-12-04 DIAGNOSIS — F1910 Other psychoactive substance abuse, uncomplicated: Secondary | ICD-10-CM | POA: Diagnosis not present

## 2016-12-04 DIAGNOSIS — F1994 Other psychoactive substance use, unspecified with psychoactive substance-induced mood disorder: Principal | ICD-10-CM | POA: Diagnosis present

## 2016-12-04 DIAGNOSIS — N289 Disorder of kidney and ureter, unspecified: Secondary | ICD-10-CM | POA: Diagnosis not present

## 2016-12-04 DIAGNOSIS — Z23 Encounter for immunization: Secondary | ICD-10-CM | POA: Diagnosis not present

## 2016-12-04 DIAGNOSIS — F419 Anxiety disorder, unspecified: Secondary | ICD-10-CM | POA: Diagnosis present

## 2016-12-04 DIAGNOSIS — F333 Major depressive disorder, recurrent, severe with psychotic symptoms: Secondary | ICD-10-CM | POA: Diagnosis present

## 2016-12-04 DIAGNOSIS — F323 Major depressive disorder, single episode, severe with psychotic features: Secondary | ICD-10-CM | POA: Diagnosis not present

## 2016-12-04 DIAGNOSIS — F1420 Cocaine dependence, uncomplicated: Secondary | ICD-10-CM | POA: Clinically undetermined

## 2016-12-04 DIAGNOSIS — Z008 Encounter for other general examination: Secondary | ICD-10-CM | POA: Diagnosis not present

## 2016-12-04 DIAGNOSIS — E876 Hypokalemia: Secondary | ICD-10-CM | POA: Diagnosis not present

## 2016-12-04 DIAGNOSIS — F19929 Other psychoactive substance use, unspecified with intoxication, unspecified: Secondary | ICD-10-CM

## 2016-12-04 DIAGNOSIS — Y900 Blood alcohol level of less than 20 mg/100 ml: Secondary | ICD-10-CM | POA: Diagnosis present

## 2016-12-04 DIAGNOSIS — N189 Chronic kidney disease, unspecified: Secondary | ICD-10-CM | POA: Diagnosis not present

## 2016-12-04 DIAGNOSIS — F1490 Cocaine use, unspecified, uncomplicated: Secondary | ICD-10-CM

## 2016-12-04 DIAGNOSIS — Z79899 Other long term (current) drug therapy: Secondary | ICD-10-CM | POA: Diagnosis not present

## 2016-12-04 DIAGNOSIS — I10 Essential (primary) hypertension: Secondary | ICD-10-CM | POA: Diagnosis not present

## 2016-12-04 DIAGNOSIS — I2510 Atherosclerotic heart disease of native coronary artery without angina pectoris: Secondary | ICD-10-CM | POA: Diagnosis not present

## 2016-12-04 DIAGNOSIS — F17210 Nicotine dependence, cigarettes, uncomplicated: Secondary | ICD-10-CM | POA: Diagnosis not present

## 2016-12-04 DIAGNOSIS — F1120 Opioid dependence, uncomplicated: Secondary | ICD-10-CM | POA: Diagnosis present

## 2016-12-04 DIAGNOSIS — Z8673 Personal history of transient ischemic attack (TIA), and cerebral infarction without residual deficits: Secondary | ICD-10-CM | POA: Diagnosis not present

## 2016-12-04 DIAGNOSIS — I129 Hypertensive chronic kidney disease with stage 1 through stage 4 chronic kidney disease, or unspecified chronic kidney disease: Secondary | ICD-10-CM | POA: Diagnosis present

## 2016-12-04 DIAGNOSIS — R45850 Homicidal ideations: Secondary | ICD-10-CM | POA: Diagnosis not present

## 2016-12-04 DIAGNOSIS — R441 Visual hallucinations: Secondary | ICD-10-CM | POA: Diagnosis not present

## 2016-12-04 DIAGNOSIS — Z7982 Long term (current) use of aspirin: Secondary | ICD-10-CM | POA: Diagnosis not present

## 2016-12-04 DIAGNOSIS — Z560 Unemployment, unspecified: Secondary | ICD-10-CM | POA: Diagnosis not present

## 2016-12-04 DIAGNOSIS — E785 Hyperlipidemia, unspecified: Secondary | ICD-10-CM | POA: Diagnosis not present

## 2016-12-04 LAB — CBC WITH DIFFERENTIAL/PLATELET
Basophils Absolute: 0 10*3/uL (ref 0.0–0.1)
Basophils Relative: 0 %
EOS ABS: 0.7 10*3/uL (ref 0.0–0.7)
Eosinophils Relative: 5 %
HEMATOCRIT: 38.2 % — AB (ref 39.0–52.0)
HEMOGLOBIN: 12.9 g/dL — AB (ref 13.0–17.0)
Lymphocytes Relative: 20 %
Lymphs Abs: 2.5 10*3/uL (ref 0.7–4.0)
MCH: 31.5 pg (ref 26.0–34.0)
MCHC: 33.8 g/dL (ref 30.0–36.0)
MCV: 93.2 fL (ref 78.0–100.0)
MONO ABS: 0.7 10*3/uL (ref 0.1–1.0)
MONOS PCT: 6 %
NEUTROS ABS: 8.5 10*3/uL — AB (ref 1.7–7.7)
Neutrophils Relative %: 69 %
Platelets: 216 10*3/uL (ref 150–400)
RBC: 4.1 MIL/uL — ABNORMAL LOW (ref 4.22–5.81)
RDW: 12.7 % (ref 11.5–15.5)
WBC: 12.4 10*3/uL — ABNORMAL HIGH (ref 4.0–10.5)

## 2016-12-04 LAB — BASIC METABOLIC PANEL
Anion gap: 11 (ref 5–15)
BUN: 12 mg/dL (ref 6–20)
CHLORIDE: 98 mmol/L — AB (ref 101–111)
CO2: 22 mmol/L (ref 22–32)
CREATININE: 1.48 mg/dL — AB (ref 0.61–1.24)
Calcium: 8.5 mg/dL — ABNORMAL LOW (ref 8.9–10.3)
GFR calc Af Amer: 58 mL/min — ABNORMAL LOW (ref >60)
GFR calc non Af Amer: 50 mL/min — ABNORMAL LOW (ref >60)
GLUCOSE: 102 mg/dL — AB (ref 65–99)
Potassium: 3.4 mmol/L — ABNORMAL LOW (ref 3.5–5.1)
Sodium: 131 mmol/L — ABNORMAL LOW (ref 135–145)

## 2016-12-04 LAB — HEPATIC FUNCTION PANEL
ALK PHOS: 64 U/L (ref 38–126)
ALT: 26 U/L (ref 17–63)
AST: 26 U/L (ref 15–41)
Albumin: 3.8 g/dL (ref 3.5–5.0)
BILIRUBIN INDIRECT: 0.3 mg/dL (ref 0.3–0.9)
Bilirubin, Direct: 0.1 mg/dL (ref 0.1–0.5)
TOTAL PROTEIN: 7.4 g/dL (ref 6.5–8.1)
Total Bilirubin: 0.4 mg/dL (ref 0.3–1.2)

## 2016-12-04 LAB — RAPID URINE DRUG SCREEN, HOSP PERFORMED
AMPHETAMINES: NOT DETECTED
BARBITURATES: NOT DETECTED
Benzodiazepines: POSITIVE — AB
Cocaine: POSITIVE — AB
Opiates: NOT DETECTED
TETRAHYDROCANNABINOL: NOT DETECTED

## 2016-12-04 LAB — ETHANOL: Alcohol, Ethyl (B): 17 mg/dL — ABNORMAL HIGH (ref <5)

## 2016-12-04 LAB — MAGNESIUM: MAGNESIUM: 1.8 mg/dL (ref 1.7–2.4)

## 2016-12-04 MED ORDER — CLONIDINE HCL 0.2 MG PO TABS
0.3000 mg | ORAL_TABLET | Freq: Two times a day (BID) | ORAL | Status: DC
  Administered 2016-12-04: 0.3 mg via ORAL
  Filled 2016-12-04: qty 1

## 2016-12-04 MED ORDER — ZOLPIDEM TARTRATE 5 MG PO TABS: 5.0000 mg | ORAL_TABLET | Freq: Every evening | ORAL | Status: DC | PRN

## 2016-12-04 MED ORDER — NICOTINE 14 MG/24HR TD PT24
14.0000 mg | MEDICATED_PATCH | Freq: Every day | TRANSDERMAL | Status: DC
  Filled 2016-12-04: qty 1

## 2016-12-04 MED ORDER — POTASSIUM CHLORIDE ER 10 MEQ PO TBCR
10.0000 meq | EXTENDED_RELEASE_TABLET | Freq: Every day | ORAL | Status: DC
  Administered 2016-12-05 – 2016-12-09 (×5): 10 meq via ORAL
  Filled 2016-12-04 (×8): qty 1

## 2016-12-04 MED ORDER — ONDANSETRON HCL 4 MG PO TABS: 4.0000 mg | ORAL_TABLET | Freq: Three times a day (TID) | ORAL | Status: DC | PRN

## 2016-12-04 MED ORDER — GABAPENTIN 400 MG PO CAPS
400.0000 mg | ORAL_CAPSULE | Freq: Every day | ORAL | Status: DC
  Administered 2016-12-05 – 2016-12-09 (×5): 400 mg via ORAL
  Filled 2016-12-04 (×9): qty 1

## 2016-12-04 MED ORDER — ASPIRIN EC 81 MG PO TBEC
162.0000 mg | DELAYED_RELEASE_TABLET | Freq: Every day | ORAL | Status: DC
  Administered 2016-12-04: 162 mg via ORAL
  Filled 2016-12-04: qty 2

## 2016-12-04 MED ORDER — MAGNESIUM GLUCONATE 500 MG PO TABS
500.0000 mg | ORAL_TABLET | ORAL | Status: DC
  Filled 2016-12-04: qty 1

## 2016-12-04 MED ORDER — ACETAMINOPHEN 325 MG PO TABS
650.0000 mg | ORAL_TABLET | ORAL | Status: DC | PRN
  Administered 2016-12-04: 650 mg via ORAL
  Filled 2016-12-04: qty 2

## 2016-12-04 MED ORDER — ASPIRIN EC 81 MG PO TBEC
162.0000 mg | DELAYED_RELEASE_TABLET | Freq: Every day | ORAL | Status: DC
  Administered 2016-12-05 – 2016-12-09 (×5): 162 mg via ORAL
  Filled 2016-12-04 (×8): qty 2

## 2016-12-04 MED ORDER — ALUM & MAG HYDROXIDE-SIMETH 200-200-20 MG/5ML PO SUSP
30.0000 mL | Freq: Four times a day (QID) | ORAL | Status: DC | PRN
Start: 2016-12-04 — End: 2016-12-04

## 2016-12-04 MED ORDER — MAGNESIUM HYDROXIDE 400 MG/5ML PO SUSP: 30.0000 mL | Freq: Every day | ORAL | Status: DC | PRN

## 2016-12-04 MED ORDER — ASPIRIN EC 81 MG PO TBEC: 162.0000 mg | DELAYED_RELEASE_TABLET | Freq: Every day | ORAL | Status: DC

## 2016-12-04 MED ORDER — TRAZODONE HCL 50 MG PO TABS
50.0000 mg | ORAL_TABLET | Freq: Every evening | ORAL | Status: DC | PRN
  Administered 2016-12-05 – 2016-12-06 (×2): 50 mg via ORAL
  Filled 2016-12-04 (×10): qty 1

## 2016-12-04 MED ORDER — NICOTINE 14 MG/24HR TD PT24
14.0000 mg | MEDICATED_PATCH | Freq: Every day | TRANSDERMAL | Status: DC
  Administered 2016-12-05 – 2016-12-09 (×3): 14 mg via TRANSDERMAL
  Filled 2016-12-04 (×9): qty 1

## 2016-12-04 MED ORDER — GABAPENTIN 400 MG PO CAPS
800.0000 mg | ORAL_CAPSULE | Freq: Every day | ORAL | Status: DC
  Administered 2016-12-04 – 2016-12-08 (×5): 800 mg via ORAL
  Filled 2016-12-04 (×8): qty 2

## 2016-12-04 MED ORDER — ATENOLOL 50 MG PO TABS
100.0000 mg | ORAL_TABLET | Freq: Every day | ORAL | Status: DC
  Filled 2016-12-04: qty 2

## 2016-12-04 MED ORDER — ATENOLOL 50 MG PO TABS
100.0000 mg | ORAL_TABLET | Freq: Every day | ORAL | Status: DC
  Administered 2016-12-04 – 2016-12-08 (×5): 100 mg via ORAL
  Filled 2016-12-04: qty 2
  Filled 2016-12-04: qty 4
  Filled 2016-12-04 (×6): qty 2

## 2016-12-04 MED ORDER — INFLUENZA VAC SPLIT QUAD 0.5 ML IM SUSY
0.5000 mL | PREFILLED_SYRINGE | INTRAMUSCULAR | Status: AC
  Administered 2016-12-05: 0.5 mL via INTRAMUSCULAR
  Filled 2016-12-04: qty 0.5

## 2016-12-04 MED ORDER — AMLODIPINE BESYLATE 10 MG PO TABS
10.0000 mg | ORAL_TABLET | Freq: Every day | ORAL | Status: DC
  Administered 2016-12-05 – 2016-12-09 (×5): 10 mg via ORAL
  Filled 2016-12-04 (×7): qty 1
  Filled 2016-12-04: qty 2

## 2016-12-04 MED ORDER — LEVETIRACETAM 500 MG PO TABS
500.0000 mg | ORAL_TABLET | Freq: Two times a day (BID) | ORAL | Status: DC
  Filled 2016-12-04 (×3): qty 1

## 2016-12-04 MED ORDER — CLONIDINE HCL 0.3 MG PO TABS
0.3000 mg | ORAL_TABLET | Freq: Two times a day (BID) | ORAL | Status: DC
  Filled 2016-12-04: qty 3
  Filled 2016-12-04 (×2): qty 1

## 2016-12-04 MED ORDER — LEVETIRACETAM 500 MG PO TABS
500.0000 mg | ORAL_TABLET | Freq: Two times a day (BID) | ORAL | Status: DC
  Administered 2016-12-04: 500 mg via ORAL
  Filled 2016-12-04: qty 1

## 2016-12-04 MED ORDER — ENSURE ENLIVE PO LIQD
237.0000 mL | Freq: Two times a day (BID) | ORAL | Status: DC
  Administered 2016-12-05 – 2016-12-09 (×11): 237 mL via ORAL

## 2016-12-04 MED ORDER — POTASSIUM CHLORIDE CRYS ER 20 MEQ PO TBCR
40.0000 meq | EXTENDED_RELEASE_TABLET | Freq: Once | ORAL | Status: AC
  Administered 2016-12-04: 40 meq via ORAL
  Filled 2016-12-04: qty 2

## 2016-12-04 MED ORDER — HYDROXYZINE HCL 25 MG PO TABS
25.0000 mg | ORAL_TABLET | Freq: Three times a day (TID) | ORAL | Status: DC | PRN
  Administered 2016-12-05 – 2016-12-07 (×4): 25 mg via ORAL
  Filled 2016-12-04 (×4): qty 1

## 2016-12-04 MED ORDER — AMLODIPINE BESYLATE 5 MG PO TABS
10.0000 mg | ORAL_TABLET | Freq: Every day | ORAL | Status: DC
Start: 2016-12-04 — End: 2016-12-04
  Administered 2016-12-04: 10 mg via ORAL
  Filled 2016-12-04: qty 2

## 2016-12-04 MED ORDER — PRAVASTATIN SODIUM 20 MG PO TABS
20.0000 mg | ORAL_TABLET | Freq: Every day | ORAL | Status: DC
  Administered 2016-12-05 – 2016-12-08 (×4): 20 mg via ORAL
  Filled 2016-12-04 (×8): qty 1

## 2016-12-04 MED ORDER — MAGNESIUM OXIDE 400 (241.3 MG) MG PO TABS
400.0000 mg | ORAL_TABLET | Freq: Once | ORAL | Status: DC
  Filled 2016-12-04: qty 1

## 2016-12-04 MED ORDER — LOSARTAN POTASSIUM 50 MG PO TABS
50.0000 mg | ORAL_TABLET | Freq: Every day | ORAL | Status: DC
  Administered 2016-12-05 – 2016-12-09 (×5): 50 mg via ORAL
  Filled 2016-12-04 (×7): qty 1

## 2016-12-04 MED ORDER — ASPIRIN EC 81 MG PO TBEC
162.0000 mg | DELAYED_RELEASE_TABLET | Freq: Every day | ORAL | Status: DC
  Filled 2016-12-04: qty 2

## 2016-12-04 MED ORDER — PNEUMOCOCCAL VAC POLYVALENT 25 MCG/0.5ML IJ INJ
0.5000 mL | INJECTION | INTRAMUSCULAR | Status: AC
  Administered 2016-12-05: 0.5 mL via INTRAMUSCULAR

## 2016-12-04 NOTE — ED Notes (Signed)
Pt sleeping. Equal rise and fall noted to chest.

## 2016-12-04 NOTE — Progress Notes (Signed)
Writer contacted Engineer, site with intake at [facility] in [place]. Writer was informed that patient's referral has been received and reviewed. Patient is now on the waitlist at [facility]. Per intake, there are no beds available at this moment and there is a waitlist, however, beds may become available next week.   Patient is accepted to [facility], to Dr. Jama Flavors, accepting provider is Assunta Found NP. ETA 20:45. AP-ED RN Karel Jarvis was notified.   Melbourne Abts, MSW, LCSWA Clinical social worker in disposition [facility], TTS Office [phone] and [phone] 12/04/2016 5:17 PM

## 2016-12-04 NOTE — ED Notes (Signed)
Pt was requesting meds early, verbalized he was getting agitated. Pt is calm and compliant at this time.

## 2016-12-04 NOTE — ED Notes (Addendum)
Per Dr. Preston Fleeting, pt needs a sitter due to the fact that he told his daughter that if he was discharged, he was going out in the streets to get heroin and overdose on it.
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
## 2016-12-04 NOTE — Tx Team (Signed)
Initial Treatment Plan 12/04/2016 11:29 PM Robert Walls HYQ:[id number]    PATIENT STRESSORS: Educational concerns Financial difficulties Health problems Substance abuse Traumatic event   PATIENT STRENGTHS: Ability for insight Active sense of humor Average or above average intelligence   PATIENT IDENTIFIED PROBLEMS: "substance abuse"  "anxiety"  "coping mechanisms after son's OD"  "medication readjustment"               DISCHARGE CRITERIA:  Ability to meet basic life and health needs Adequate post-discharge living arrangements Improved stabilization in mood, thinking, and/or behavior  PRELIMINARY DISCHARGE PLAN: Attend aftercare/continuing care group Attend 12-step recovery group Outpatient therapy  PATIENT/FAMILY INVOLVEMENT: This treatment plan has been presented to and reviewed with the patient, Robert Walls.  The patient and family have been given the opportunity to ask questions and make suggestions.  Jonetta Speak, RN 12/04/2016, 11:29 PM

## 2016-12-04 NOTE — ED Notes (Signed)
Thurston Pounds, Counselor with [facility] advised that the pt meets inpatient criteria

## 2016-12-04 NOTE — ED Notes (Signed)
Pt has signed

## 2016-12-04 NOTE — ED Notes (Signed)
Called [facility] for med  

## 2016-12-04 NOTE — ED Notes (Addendum)
[phone] Tiffany-daughter

## 2016-12-04 NOTE — Consult Note (Signed)
Telepsych Consultation   Reason for Consult:  Requesting assistance with polysubstance abuse Referring Physician:  Delora Fuel, MD Location of Patient: [facility] Location of Provider: [facility]  Patient Identification: Robert Walls MRN:  [id number] Principal Diagnosis: Substance induced mood disorder [facility]) Diagnosis:   Patient Active Problem List   Diagnosis Date Noted  . Polysubstance abuse [F19.10] 12/04/2016  . Substance induced mood disorder ([place]) [F19.94] 12/04/2016  . Confusion [R41.0] 02/18/2014  . Acute on chronic renal insufficiency [N28.9, N18.9] 02/18/2014  . History of stroke [Z86.73] 10/10/2013  . Seizure ([place]) [R56.9] 10/09/2013  . Alcohol abuse [F10.10] 10/09/2013  . Essential hypertension, benign [I10] 10/09/2013  . Rotator cuff tear arthropathy of right shoulder [M12.811] 06/16/2011    Total Time spent with patient: 30 minutes  Subjective:   Robert Walls is a 61 y.o. male patient presented to [place] with complaint of 18 yrs of polysubstance abuse and requesting assistance  HPI:  Robert Walls, 61 y.o., male patient seen by this provider on 12/04/16.  Chart reviewed and consulted with Dr. Dwyane Dee.  On evaluation Robert Walls reports that he has worsening depression related to the ongoing substance abuse.  States that he uses daily "anything but alcohol".  States that he is afraid to go back home "It is dangerous to send me home"  Reports that he would not intentionally kill himself but is afraid of what will happen with the drug use.  Patient is tearful during interview.  Patient states that he is homeless but has been staying with his daughter who is very supportive.  States that he is ready to get his life together.  Patient denies homicidal ideation and paranoia.  Endorses visual hallucinations "I constantly see wavy lines" and passive suicidal ideation, no specific  plan or intent but feels that it will happen with his drug use.    Past Psychiatric  History: Major depression; Polysubstance abuse.  Prior detox rehab in 2003 but was unable to stay clean.    Risk to Self: Suicidal Ideation: Yes-Currently Present (Per pt's daughter. ) Suicidal Intent: Yes-Currently Present Is patient at risk for suicide?: Yes Suicidal Plan?: Yes-Currently Present Specify Current Suicidal Plan: Per pt's daughter to OD on drugs.  Access to Means: Yes Specify Access to Suicidal Means: Pt has access to drugs.  What has been your use of drugs/alcohol within the last 12 months?: Benzodiazepines, alcohol, and cocaine. How many times?: 0 Other Self Harm Risks: Pt denies. Triggers for Past Attempts: None known Intentional Self Injurious Behavior: None (Pt denies. ) Risk to Others: Homicidal Ideation: No (Pt denies. ) Thoughts of Harm to Others: Yes-Currently Present Comment - Thoughts of Harm to Others: Pt reported, wanting to hurt the people who sold him the drugs.  Current Homicidal Intent: No (Pt denies. ) Current Homicidal Plan: No Access to Homicidal Means: No Identified Victim: Drug dealers.  History of harm to others?: Yes Assessment of Violence: In distant past Violent Behavior Description: Pt reported, getting into fights as a teenager.  Does patient have access to weapons?: Yes (Comment) (Knives. ) Criminal Charges Pending?: No Does patient have a court date: No Prior Inpatient Therapy: Prior Inpatient Therapy: Yes Prior Therapy Dates: UTA Prior Therapy Facilty/Provider(s): [facility] Reason for Treatment: Detox, substance use treatment.  Prior Outpatient Therapy: Prior Outpatient Therapy: No Prior Therapy Dates: NA Prior Therapy Facilty/Provider(s): NA Reason for Treatment: NA Does patient have an ACCT team?: No Does patient have Intensive In-House Services?  : No  Does patient have [facility] services? : No Does patient have P4CC services?: No  Past Medical History:  Past Medical History:  Diagnosis Date  . Anxiety   . Aortic  atherosclerosis ([place])    Documented by TEE  . Arthritis   . Back pain   . Concussion 1993  . Essential hypertension, benign   . History of stroke July 2015   Small acute to subacute infarcts - TEE negative for cardioembolic source  . Impaired hearing   . Seizures ([facility])    Dilantin  . Stroke ([place])   . Substance abuse     Past Surgical History:  Procedure Laterality Date  . BACK SURGERY  1993  . FINGER SURGERY  2003   Left ring finger  . INNER EAR SURGERY     Childhood  . TEE WITHOUT CARDIOVERSION N/A 10/13/2013   Procedure: TRANSESOPHAGEAL ECHOCARDIOGRAM (TEE) WITH PROPOFOL;  Surgeon: Satira Sark, MD;  Location: [facility];  Service: Cardiovascular;  Laterality: N/A;   Family History:  Family History  Problem Relation Age of Onset  . Anesthesia problems Neg Hx   . Hypotension Neg Hx   . Malignant hyperthermia Neg Hx   . Pseudochol deficiency Neg Hx    Family Psychiatric  History: Denies Social History:  History  Alcohol Use No    Comment: former- last etoh was June     History  Drug Use  . Types: Marijuana, Cocaine    Comment: heroin, crack,    Social History   Social History  . Marital status: Divorced    Spouse name: N/A  . Number of children: N/A  . Years of education: N/A   Social History Main Topics  . Smoking status: Current Every Day Smoker    Packs/day: 0.50    Years: 8.00    Types: Cigarettes  . Smokeless tobacco: Never Used  . Alcohol use No     Comment: former- last etoh was June  . Drug use: Yes    Types: Marijuana, Cocaine     Comment: heroin, crack,  . Sexual activity: No   Other Topics Concern  . None   Social History Narrative  . None   Additional Social History:    Allergies:   Allergies  Allergen Reactions  . Hctz [Hydrochlorothiazide] Other (See Comments)    Depleted sodium, requiring hospitalization (lisinopril-HCTZ combo)    Labs:  Results for orders placed or performed during the hospital encounter of 12/03/16  (from the past 48 hour(s))  Rapid urine drug screen (hospital performed)     Status: Abnormal   Collection Time: 12/04/16 12:51 AM  Result Value Ref Range   Opiates NONE DETECTED NONE DETECTED   Cocaine POSITIVE (A) NONE DETECTED   Benzodiazepines POSITIVE (A) NONE DETECTED   Amphetamines NONE DETECTED NONE DETECTED   Tetrahydrocannabinol NONE DETECTED NONE DETECTED   Barbiturates NONE DETECTED NONE DETECTED    Comment:        DRUG SCREEN FOR MEDICAL PURPOSES ONLY.  IF CONFIRMATION IS NEEDED FOR ANY PURPOSE, NOTIFY LAB WITHIN 5 DAYS.        LOWEST DETECTABLE LIMITS FOR URINE DRUG SCREEN Drug Class       Cutoff (ng/mL) Amphetamine      1000 Barbiturate      200 Benzodiazepine   867 Tricyclics       619 Opiates          300 Cocaine          300 THC  50   CBC with Differential     Status: Abnormal   Collection Time: 12/04/16 12:55 AM  Result Value Ref Range   WBC 12.4 (H) 4.0 - 10.5 K/uL   RBC 4.10 (L) 4.22 - 5.81 MIL/uL   Hemoglobin 12.9 (L) 13.0 - 17.0 g/dL   HCT 38.2 (L) 39.0 - 52.0 %   MCV 93.2 78.0 - 100.0 fL   MCH 31.5 26.0 - 34.0 pg   MCHC 33.8 30.0 - 36.0 g/dL   RDW 12.7 11.5 - 15.5 %   Platelets 216 150 - 400 K/uL   Neutrophils Relative % 69 %   Neutro Abs 8.5 (H) 1.7 - 7.7 K/uL   Lymphocytes Relative 20 %   Lymphs Abs 2.5 0.7 - 4.0 K/uL   Monocytes Relative 6 %   Monocytes Absolute 0.7 0.1 - 1.0 K/uL   Eosinophils Relative 5 %   Eosinophils Absolute 0.7 0.0 - 0.7 K/uL   Basophils Relative 0 %   Basophils Absolute 0.0 0.0 - 0.1 K/uL  Basic metabolic panel     Status: Abnormal   Collection Time: 12/04/16 12:55 AM  Result Value Ref Range   Sodium 131 (L) 135 - 145 mmol/L   Potassium 3.4 (L) 3.5 - 5.1 mmol/L   Chloride 98 (L) 101 - 111 mmol/L   CO2 22 22 - 32 mmol/L   Glucose, Bld 102 (H) 65 - 99 mg/dL   BUN 12 6 - 20 mg/dL   Creatinine, Ser 1.48 (H) 0.61 - 1.24 mg/dL   Calcium 8.5 (L) 8.9 - 10.3 mg/dL   GFR calc non Af Amer 50 (L) >60 mL/min    GFR calc Af Amer 58 (L) >60 mL/min    Comment: (NOTE) The eGFR has been calculated using the CKD EPI equation. This calculation has not been validated in all clinical situations. eGFR's persistently <60 mL/min signify possible Chronic Kidney Disease.    Anion gap 11 5 - 15  Ethanol     Status: Abnormal   Collection Time: 12/04/16 12:55 AM  Result Value Ref Range   Alcohol, Ethyl (B) 17 (H) <5 mg/dL    Comment:        LOWEST DETECTABLE LIMIT FOR SERUM ALCOHOL IS 5 mg/dL FOR MEDICAL PURPOSES ONLY   Magnesium     Status: None   Collection Time: 12/04/16  1:20 AM  Result Value Ref Range   Magnesium 1.8 1.7 - 2.4 mg/dL  Hepatic function panel     Status: None   Collection Time: 12/04/16  1:20 AM  Result Value Ref Range   Total Protein 7.4 6.5 - 8.1 g/dL   Albumin 3.8 3.5 - 5.0 g/dL   AST 26 15 - 41 U/L   ALT 26 17 - 63 U/L   Alkaline Phosphatase 64 38 - 126 U/L   Total Bilirubin 0.4 0.3 - 1.2 mg/dL   Bilirubin, Direct 0.1 0.1 - 0.5 mg/dL   Indirect Bilirubin 0.3 0.3 - 0.9 mg/dL    Medications:  Current Facility-Administered Medications  Medication Dose Route Frequency Provider Last Rate Last Dose  . acetaminophen (TYLENOL) tablet 650 mg  650 mg Oral G2X PRN Delora Fuel, MD   528 mg at 12/04/16 1602  . alum & mag hydroxide-simeth (MAALOX/MYLANTA) 200-200-20 MG/5ML suspension 30 mL  30 mL Oral U1L PRN Delora Fuel, MD      . amLODipine (NORVASC) tablet 10 mg  10 mg Oral Daily Delora Fuel, MD   10 mg at 12/04/16 2440  .  aspirin EC tablet 162 mg  166 mg Oral Daily Delora Fuel, MD   063 mg at 12/04/16 0160  . cloNIDine (CATAPRES) tablet 0.3 mg  0.3 mg Oral BID Delora Fuel, MD   0.3 mg at 12/04/16 1093  . levETIRAcetam (KEPPRA) tablet 500 mg  235 mg Oral BID Delora Fuel, MD   573 mg at 12/04/16 0925  . magnesium oxide (MAG-OX) tablet 400 mg  220 mg Oral Once Delora Fuel, MD      . nicotine (NICODERM CQ - dosed in mg/24 hours) patch 14 mg  14 mg Transdermal Daily Delora Fuel, MD      . ondansetron [facility]) tablet 4 mg  4 mg Oral U5K PRN Delora Fuel, MD      . zolpidem Lorrin Mais) tablet 5 mg  5 mg Oral QHS PRN Delora Fuel, MD       Current Outpatient Prescriptions  Medication Sig Dispense Refill  . amLODipine (NORVASC) 10 MG tablet Take 1 tablet (10 mg total) by mouth daily. 30 tablet 0  . aspirin EC 162 MG EC tablet Take 1 tablet (162 mg total) by mouth daily. 60 tablet 0  . atenolol (TENORMIN) 100 MG tablet Take 100 mg by mouth daily.    Marland Kitchen gabapentin (NEURONTIN) 400 MG capsule Take 400 mg by mouth. Every morning and 800 mg at bedtime.    Marland Kitchen losartan (COZAAR) 50 MG tablet Take 50 mg by mouth daily.    Marland Kitchen lovastatin (MEVACOR) 20 MG tablet Take 1 tablet (20 mg total) by mouth at bedtime. 30 tablet 6    Musculoskeletal: Strength & Muscle Tone: within normal limits Gait & Station: normal Patient leans: N/A  Psychiatric Specialty Exam: Physical Exam  ROS  Blood pressure 109/62, pulse 61, temperature 98.7 F (37.1 C), temperature source Oral, resp. rate 18, height 5' 7.5" (1.715 m), weight 72.6 kg (160 lb), SpO2 97 %.Body mass index is 24.69 kg/m.  General Appearance: Casual  Eye Contact:  Good  Speech:  Clear and Coherent and Normal Rate  Volume:  Normal  Mood:  Depressed and Hopeless  Affect:  Depressed, Flat and Tearful  Thought Process:  Coherent and Goal Directed  Orientation:  Full (Time, Place, and Person)  Thought Content:  Hallucinations: Visual  Suicidal Thoughts:  Passive related to drug use but states that he would not intentionally try to kill himself  Homicidal Thoughts:  Yes.  without intent/plan  Memory:  Immediate;   Good Recent;   Good Remote;   Good  Judgement:  Fair  Insight:  Fair  Psychomotor Activity:  Tremor  Concentration:  Concentration: Fair and Attention Span: Fair  Recall:  Good  Fund of Knowledge:  Fair  Language:  Good  Akathisia:  No  Handed:  Right  AIMS (if indicated):     Assets:  Communication  Skills Desire for Improvement Intimacy Social Support  ADL's:  Intact  Cognition:  WNL  Sleep:        Treatment Plan Summary: Daily contact with patient to assess and evaluate symptoms and progress in treatment, Medication management and Plan Inpatient psychiatric treatment  Disposition: Recommend psychiatric Inpatient admission when medically cleared.  This service was provided via telemedicine using a 2-way, interactive audio and video technology.  Names of all persons participating in this telemedicine service and their role in this encounter. Name: Earleen Newport NP Role: Telepsych  Name: Dr Dwyane Dee Role: Psychiatrist  Name:  Role:   Name:  Role:     Francile Woolford,  Slyvester Latona, NP 12/04/2016 5:08 PM

## 2016-12-04 NOTE — ED Notes (Signed)
Pt wanded by security. 

## 2016-12-04 NOTE — ED Notes (Signed)
Talked with [facility] and they stated that pt has been accepted for treatment at [facility].

## 2016-12-04 NOTE — Progress Notes (Signed)
Patient presents with pleasant/depressed/anxious affect and  behavior during admission interview and assessment. VS monitored and recorded. Skin check performed with Kiara MHT and revealed multiple tattoos. Contraband was not found. Patient was oriented to unit and schedule. Pt states "I am here because I use crack daily and heroin every week. It is killing me and I know I got to get better.". Pt denies SI/HI/AVH at this time. PO fluids provided. Safety maintained. Rest encouraged.

## 2016-12-04 NOTE — ED Provider Notes (Signed)
[facility] Provider Note   CSN: [id number] Arrival date & time: 12/03/16  2334     History   Chief Complaint Chief Complaint  Patient presents with  . Medical Clearance    HPI Robert Walls is a 61 y.o. male.  The history is provided by the patient.  He has a long history of polysubstance abuse including heroin, cocaine, benzodiazepines. Today, he said that he wanted to go through detox. He has looked at numerous facilities and they're concerned about his medical history because of history of strokes and renal insufficiency and he was advised to come to the hospital to go through detox as an inpatient. He does admit to having 4 beers today, but states he normally does not consume much of all. He is a cigarette smoker. He denies homicidal or suicidal ideation. He denies hallucinations. He is complaining of cramps in his feet since arriving in the ED. He frequently takes potassium pills when this is a problem.  Past Medical History:  Diagnosis Date  . Anxiety   . Aortic atherosclerosis ([facility])    Documented by TEE  . Arthritis   . Back pain   . Concussion 1993  . Essential hypertension, benign   . History of stroke July 2015   Small acute to subacute infarcts - TEE negative for cardioembolic source  . Impaired hearing   . Seizures ([facility])    Dilantin  . Stroke ([facility])   . Substance abuse     Patient Active Problem List   Diagnosis Date Noted  . Confusion 02/18/2014  . Acute on chronic renal insufficiency 02/18/2014  . History of stroke 10/10/2013  . Seizure ([facility]) 10/09/2013  . Alcohol abuse 10/09/2013  . Essential hypertension, benign 10/09/2013  . Rotator cuff tear arthropathy of right shoulder 06/16/2011    Past Surgical History:  Procedure Laterality Date  . BACK SURGERY  1993  . FINGER SURGERY  2003   Left ring finger  . INNER EAR SURGERY     Childhood  . TEE WITHOUT CARDIOVERSION N/A 10/13/2013   Procedure: TRANSESOPHAGEAL ECHOCARDIOGRAM (TEE) WITH  PROPOFOL;  Surgeon: Jonelle Sidle, MD;  Location: [facility];  Service: Cardiovascular;  Laterality: N/A;       Home Medications    Prior to Admission medications   Medication Sig Start Date End Date Taking? Authorizing Provider  amLODipine (NORVASC) 10 MG tablet Take 1 tablet (10 mg total) by mouth daily. 02/20/14   Penny Pia, MD  aspirin EC 162 MG EC tablet Take 1 tablet (162 mg total) by mouth daily. 02/20/14   Penny Pia, MD  cloNIDine (CATAPRES) 0.3 MG tablet Take 1 tablet (0.3 mg total) by mouth 2 (two) times daily. 07/12/14   Jonelle Sidle, MD  levETIRAcetam (KEPPRA) 500 MG tablet Take 1 tablet (500 mg total) by mouth 2 (two) times daily. 02/20/14   Penny Pia, MD  lovastatin (MEVACOR) 20 MG tablet Take 1 tablet (20 mg total) by mouth at bedtime. 12/11/13   Jonelle Sidle, MD    Family History Family History  Problem Relation Age of Onset  . Anesthesia problems Neg Hx   . Hypotension Neg Hx   . Malignant hyperthermia Neg Hx   . Pseudochol deficiency Neg Hx     Social History Social History  Substance Use Topics  . Smoking status: Current Every Day Smoker    Packs/day: 0.50    Years: 8.00    Types: Cigarettes  . Smokeless tobacco: Never Used  .  Alcohol use No     Comment: former- last etoh was June     Allergies   Hctz [hydrochlorothiazide]   Review of Systems Review of Systems  All other systems reviewed and are negative.    Physical Exam Updated Vital Signs BP (!) 170/88 (BP Location: Right Arm)   Pulse 77   Temp 97.9 F (36.6 C) (Oral)   Resp 20   Ht 5' 7.5" (1.715 m)   Wt 72.6 kg (160 lb)   SpO2 100%   BMI 24.69 kg/m   Physical Exam  Nursing note and vitals reviewed.  61 year old male, resting comfortably and in no acute distress. Vital signs are significant for hypertension. Oxygen saturation is 100%, which is normal. Head is normocephalic and atraumatic. PERRLA, EOMI. Oropharynx is clear. Neck is nontender and supple without  adenopathy or JVD. Back is nontender and there is no CVA tenderness. Lungs are clear without rales, wheezes, or rhonchi. Chest is nontender. Heart has regular rate and rhythm without murmur. Abdomen is soft, flat, nontender without masses or hepatosplenomegaly and peristalsis is normoactive. Extremities have no cyanosis or edema, full range of motion is present. Skin is warm and dry without rash. Neurologic: Mental status is normal, cranial nerves are intact, there are no motor or sensory deficits.  ED Treatments / Results  Labs (all labs ordered are listed, but only abnormal results are displayed) Labs Reviewed  CBC WITH DIFFERENTIAL/PLATELET - Abnormal; Notable for the following:       Result Value   WBC 12.4 (*)    RBC 4.10 (*)    Hemoglobin 12.9 (*)    HCT 38.2 (*)    Neutro Abs 8.5 (*)    All other components within normal limits  BASIC METABOLIC PANEL - Abnormal; Notable for the following:    Sodium 131 (*)    Potassium 3.4 (*)    Chloride 98 (*)    Glucose, Bld 102 (*)    Creatinine, Ser 1.48 (*)    Calcium 8.5 (*)    GFR calc non Af Amer 50 (*)    GFR calc Af Amer 58 (*)    All other components within normal limits  ETHANOL - Abnormal; Notable for the following:    Alcohol, Ethyl (B) 17 (*)    All other components within normal limits  RAPID URINE DRUG SCREEN, HOSP PERFORMED - Abnormal; Notable for the following:    Cocaine POSITIVE (*)    Benzodiazepines POSITIVE (*)    All other components within normal limits  MAGNESIUM  HEPATIC FUNCTION PANEL   Procedures Procedures (including critical care time)  Medications Ordered in ED Medications  magnesium gluconate (MAGONATE) tablet 500 mg (not administered)  potassium chloride SA (K-DUR,KLOR-CON) CR tablet 40 mEq (40 mEq Oral Given 12/04/16 0154)     Initial Impression / Assessment and Plan / ED Course  I have reviewed the triage vital signs and the nursing notes.  Pertinent labs & imaging results that were  available during my care of the patient were reviewed by me and considered in my medical decision making (see chart for details).  Polysubstance abuse. History of stroke and renal insufficiency and hypertension. Old records are reviewed, and I see no relevant past visits. Screening labs are obtained showing borderline anemia and borderline hypokalemia as well as mild renal insufficiency. Renal insufficiency is not significantly changed from baseline from 3 years ago. Because he is having cramping, he is given dose of oral potassium and magnesium level  will be checked.  Potassium is borderline low at 3.4, admitting the scene is in the low end of normal range at 1.8. He is given additional potassium and magnesium. Mild hyponatremia is present at 131. This is not felt to be clinically significant. Creatinine is mildly elevated at 1.48, which is in the range that he has been in previously. Urine drug screen is positive for cocaine and benzodiazepines, consistent with that history. Curiously opiates were not distracted. At this point, he is felt to be medically cleared. However, his daughter states that the patient told her if he was not admitted from the ED and that he would overdose on heroin. TTS consultation was obtained and they feel that he meets inpatient criteria. He is being held in the ED pending appropriate psychiatric placement.  Final Clinical Impressions(s) / ED Diagnoses   Final diagnoses:  Suicidal ideation  Polysubstance abuse  Hypokalemia  Hyponatremia  Renal insufficiency    New Prescriptions New Prescriptions   No medications on file     Dione Booze, MD 12/04/16 [phone]

## 2016-12-04 NOTE — ED Notes (Signed)
Can come by 2045 tonight. Accepting NP  Shuven Rankin. Dr. Jama Flavors . Room 300-2.

## 2016-12-04 NOTE — ED Notes (Signed)
Pt's "social worker"- Alger Memos

## 2016-12-05 DIAGNOSIS — I2510 Atherosclerotic heart disease of native coronary artery without angina pectoris: Secondary | ICD-10-CM

## 2016-12-05 DIAGNOSIS — R441 Visual hallucinations: Secondary | ICD-10-CM

## 2016-12-05 DIAGNOSIS — R45850 Homicidal ideations: Secondary | ICD-10-CM

## 2016-12-05 DIAGNOSIS — Z560 Unemployment, unspecified: Secondary | ICD-10-CM

## 2016-12-05 DIAGNOSIS — E785 Hyperlipidemia, unspecified: Secondary | ICD-10-CM

## 2016-12-05 DIAGNOSIS — F1994 Other psychoactive substance use, unspecified with psychoactive substance-induced mood disorder: Principal | ICD-10-CM

## 2016-12-05 DIAGNOSIS — F323 Major depressive disorder, single episode, severe with psychotic features: Secondary | ICD-10-CM

## 2016-12-05 DIAGNOSIS — F1310 Sedative, hypnotic or anxiolytic abuse, uncomplicated: Secondary | ICD-10-CM

## 2016-12-05 DIAGNOSIS — F17210 Nicotine dependence, cigarettes, uncomplicated: Secondary | ICD-10-CM

## 2016-12-05 DIAGNOSIS — I10 Essential (primary) hypertension: Secondary | ICD-10-CM

## 2016-12-05 DIAGNOSIS — F1410 Cocaine abuse, uncomplicated: Secondary | ICD-10-CM

## 2016-12-05 DIAGNOSIS — N189 Chronic kidney disease, unspecified: Secondary | ICD-10-CM

## 2016-12-05 MED ORDER — CHLORDIAZEPOXIDE HCL 25 MG PO CAPS
25.0000 mg | ORAL_CAPSULE | Freq: Four times a day (QID) | ORAL | Status: DC | PRN
  Administered 2016-12-06 – 2016-12-08 (×4): 25 mg via ORAL
  Filled 2016-12-05 (×4): qty 1

## 2016-12-05 MED ORDER — IBUPROFEN 600 MG PO TABS
600.0000 mg | ORAL_TABLET | Freq: Four times a day (QID) | ORAL | Status: DC | PRN
  Administered 2016-12-05 – 2016-12-09 (×7): 600 mg via ORAL
  Filled 2016-12-05 (×7): qty 1

## 2016-12-05 MED ORDER — QUETIAPINE FUMARATE 25 MG PO TABS
25.0000 mg | ORAL_TABLET | Freq: Every day | ORAL | Status: DC
  Administered 2016-12-05 – 2016-12-09 (×5): 25 mg via ORAL
  Filled 2016-12-05 (×7): qty 1

## 2016-12-05 MED ORDER — VITAMIN B-1 100 MG PO TABS
100.0000 mg | ORAL_TABLET | Freq: Every day | ORAL | Status: AC
  Administered 2016-12-05 – 2016-12-07 (×3): 100 mg via ORAL
  Filled 2016-12-05 (×3): qty 1

## 2016-12-05 MED ORDER — QUETIAPINE FUMARATE 50 MG PO TABS
50.0000 mg | ORAL_TABLET | Freq: Every day | ORAL | Status: DC
  Administered 2016-12-05 – 2016-12-07 (×3): 50 mg via ORAL
  Filled 2016-12-05 (×4): qty 1

## 2016-12-05 NOTE — Progress Notes (Signed)
D.  Pt pleasant on approach, complaint of back and leg pain.  Pt states that he has rods in his back and rates the pain 9/10.  Pt was positive for evening AA group, observed engaged in appropriate interaction with peers on the unit.  Pt denies SI/HI/AVH at this time.  A.  Support and encouragement offered, medication given as ordered  R.  Pt remains safe on the unit, will continue to monitor.

## 2016-12-05 NOTE — H&P (Signed)
Psychiatric Admission Assessment Adult  Patient Identification: Robert Walls MRN:  [id number] Date of Evaluation:  12/05/2016 Chief Complaint:  I want to stop using drugs Principal Diagnosis: SIMD                                         SUD Diagnosis:   Patient Active Problem List   Diagnosis Date Noted  . Polysubstance abuse [F19.10] 12/04/2016  . Substance induced mood disorder ([place]) [F19.94] 12/04/2016  . MDD (major depressive disorder), single episode, severe with psychosis ([place]) [F32.3] 12/04/2016  . MDD (major depressive disorder), recurrent, severe, with psychosis ([place]) [F33.3] 12/04/2016  . Confusion [R41.0] 02/18/2014  . Acute on chronic renal insufficiency [N28.9, N18.9] 02/18/2014  . History of stroke [Z86.73] 10/10/2013  . Seizure ([place]) [R56.9] 10/09/2013  . Essential hypertension, benign [I10] 10/09/2013  . Rotator cuff tear arthropathy of right shoulder [M12.811] 06/16/2011   History of Present Illness: 61 y.o Caucasian male, divorced, homeless, on SSID. Background history of SUD. Presented to the ER in company of his daughter. Wants to get off street drugs. Has very significant medical history hence need for inpatient detox. UDS was positive for Cocaine and Benzodiazepines. BAL 17 mg/dl. Moderate renal impairment with hyponatremia and hypokalemia. Currently on potassium supplements. At interview, patient reports substance use since his early teens. Says he used LSD and anything available then. Says he progressed over the years to opiates, cocaine and benzodiazepines. Says he is not a heavy drinker. Patient says he has been struggling with the opiates more. Says he is not sure what they are putting in it these days. Says all he wants to do is take it to feel normal. Says he spends all his money on chasing the drugs. He owes his drug dealer [phone]. Says he has a lot of anger towards his dealers. Says he had violent thoughts towards them but has realized that there are down the line  on the supply chain. Patient states that he wants to quit using. Says he has no intention of harming anyone. He is not suicidal. Says he is willing to do anything that would keep him away from using. Says his daughter is making arrangements for rehab in [place]. Patient says he cannot go back to [facility] as he would relapse almost immediately.  Reports transient episodes of visual hallucinations while on substances. No hallucination in any modality today. No feeling of impending doom. Not expressing any other form of delusion. Reports irritability and anger towards his lifestyle. Not pervasively depressed. Quality of sleep is related to substance use. Has good appetite. Normal energy levels. No overwhelming anxiety. No access to weapons. No thoughts of violence. No legal issues. No other stressor.   Total Time spent with patient: 1 hour  Past Psychiatric History: No past psychiatry history. Naive to psychotropic medications. No past depression or mania. No past suicidal behavior.   Is the patient at risk to self? No.  Has the patient been a risk to self in the past 6 months? No.  Has the patient been a risk to self within the distant past? No.  Is the patient a risk to others? No.  Has the patient been a risk to others in the past 6 months? No.  Has the patient been a risk to others within the distant past? No.   Prior Inpatient Therapy:   Prior Outpatient Therapy:  Alcohol Screening: 1. How often do you have a drink containing alcohol?: Never 9. Have you or someone else been injured as a result of your drinking?: No 10. Has a relative or friend or a doctor or another health worker been concerned about your drinking or suggested you cut down?: No Brief Intervention: AUDIT score less than 7 or less-screening does not suggest unhealthy drinking-brief intervention not indicated Substance Abuse History in the last 12 months:  Yes.   Consequences of Substance Abuse: Financial  constraints, ruptured relationships over the years. Worsening of his medical condition.  Previous Psychotropic Medications: No  Psychological Evaluations: No  Past Medical History:  Past Medical History:  Diagnosis Date  . Anxiety   . Aortic atherosclerosis ([place])    Documented by TEE  . Arthritis   . Back pain   . Concussion 1993  . Essential hypertension, benign   . History of stroke July 2015   Small acute to subacute infarcts - TEE negative for cardioembolic source  . Impaired hearing   . Seizures (H[facility])    Dilantin  . Stroke ([place])   . Substance abuse     Past Surgical History:  Procedure Laterality Date  . BACK SURGERY  1993  . FINGER SURGERY  2003   Left ring finger  . INNER EAR SURGERY     Childhood  . TEE WITHOUT CARDIOVERSION N/A 10/13/2013   Procedure: TRANSESOPHAGEAL ECHOCARDIOGRAM (TEE) WITH PROPOFOL;  Surgeon: Satira Sark, MD;  Location: [facility];  Service: Cardiovascular;  Laterality: N/A;   Family History:  Family History  Problem Relation Age of Onset  . Anesthesia problems Neg Hx   . Hypotension Neg Hx   . Malignant hyperthermia Neg Hx   . Pseudochol deficiency Neg Hx    Family Psychiatric  History: Family history of addiction. No family history of any major mental illness or suicide.  Tobacco Screening: Have you used any form of tobacco in the last 30 days? (Cigarettes, Smokeless Tobacco, Cigars, and/or Pipes): Yes Tobacco use, Select all that apply: 5 or more cigarettes per day Are you interested in Tobacco Cessation Medications?: Yes, will notify MD for an order Counseled patient on smoking cessation including recognizing danger situations, developing coping skills and basic information about quitting provided: Yes Social History:  History  Alcohol Use No    Comment: former- last etoh was June     History  Drug Use  . Types: Marijuana, Cocaine, Heroin    Comment: heroin, crack,    Additional Social History:       No military experience.  Not in any relationship currently. Strong religious belief and upbringing.  Allergies:   Allergies  Allergen Reactions  . Hctz [Hydrochlorothiazide] Other (See Comments)    Depleted sodium, requiring hospitalization (lisinopril-HCTZ combo)   Lab Results:  Results for orders placed or performed during the hospital encounter of 12/03/16 (from the past 48 hour(s))  Rapid urine drug screen (hospital performed)     Status: Abnormal   Collection Time: 12/04/16 12:51 AM  Result Value Ref Range   Opiates NONE DETECTED NONE DETECTED   Cocaine POSITIVE (A) NONE DETECTED   Benzodiazepines POSITIVE (A) NONE DETECTED   Amphetamines NONE DETECTED NONE DETECTED   Tetrahydrocannabinol NONE DETECTED NONE DETECTED   Barbiturates NONE DETECTED NONE DETECTED    Comment:        DRUG SCREEN FOR MEDICAL PURPOSES ONLY.  IF CONFIRMATION IS NEEDED FOR ANY PURPOSE, NOTIFY LAB WITHIN 5 DAYS.  LOWEST DETECTABLE LIMITS FOR URINE DRUG SCREEN Drug Class       Cutoff (ng/mL) Amphetamine      1000 Barbiturate      200 Benzodiazepine   254 Tricyclics       982 Opiates          300 Cocaine          300 THC              50   CBC with Differential     Status: Abnormal   Collection Time: 12/04/16 12:55 AM  Result Value Ref Range   WBC 12.4 (H) 4.0 - 10.5 K/uL   RBC 4.10 (L) 4.22 - 5.81 MIL/uL   Hemoglobin 12.9 (L) 13.0 - 17.0 g/dL   HCT 38.2 (L) 39.0 - 52.0 %   MCV 93.2 78.0 - 100.0 fL   MCH 31.5 26.0 - 34.0 pg   MCHC 33.8 30.0 - 36.0 g/dL   RDW 12.7 11.5 - 15.5 %   Platelets 216 150 - 400 K/uL   Neutrophils Relative % 69 %   Neutro Abs 8.5 (H) 1.7 - 7.7 K/uL   Lymphocytes Relative 20 %   Lymphs Abs 2.5 0.7 - 4.0 K/uL   Monocytes Relative 6 %   Monocytes Absolute 0.7 0.1 - 1.0 K/uL   Eosinophils Relative 5 %   Eosinophils Absolute 0.7 0.0 - 0.7 K/uL   Basophils Relative 0 %   Basophils Absolute 0.0 0.0 - 0.1 K/uL  Basic metabolic panel     Status: Abnormal   Collection Time: 12/04/16 12:55  AM  Result Value Ref Range   Sodium 131 (L) 135 - 145 mmol/L   Potassium 3.4 (L) 3.5 - 5.1 mmol/L   Chloride 98 (L) 101 - 111 mmol/L   CO2 22 22 - 32 mmol/L   Glucose, Bld 102 (H) 65 - 99 mg/dL   BUN 12 6 - 20 mg/dL   Creatinine, Ser 1.48 (H) 0.61 - 1.24 mg/dL   Calcium 8.5 (L) 8.9 - 10.3 mg/dL   GFR calc non Af Amer 50 (L) >60 mL/min   GFR calc Af Amer 58 (L) >60 mL/min    Comment: (NOTE) The eGFR has been calculated using the CKD EPI equation. This calculation has not been validated in all clinical situations. eGFR's persistently <60 mL/min signify possible Chronic Kidney Disease.    Anion gap 11 5 - 15  Ethanol     Status: Abnormal   Collection Time: 12/04/16 12:55 AM  Result Value Ref Range   Alcohol, Ethyl (B) 17 (H) <5 mg/dL    Comment:        LOWEST DETECTABLE LIMIT FOR SERUM ALCOHOL IS 5 mg/dL FOR MEDICAL PURPOSES ONLY   Magnesium     Status: None   Collection Time: 12/04/16  1:20 AM  Result Value Ref Range   Magnesium 1.8 1.7 - 2.4 mg/dL  Hepatic function panel     Status: None   Collection Time: 12/04/16  1:20 AM  Result Value Ref Range   Total Protein 7.4 6.5 - 8.1 g/dL   Albumin 3.8 3.5 - 5.0 g/dL   AST 26 15 - 41 U/L   ALT 26 17 - 63 U/L   Alkaline Phosphatase 64 38 - 126 U/L   Total Bilirubin 0.4 0.3 - 1.2 mg/dL   Bilirubin, Direct 0.1 0.1 - 0.5 mg/dL   Indirect Bilirubin 0.3 0.3 - 0.9 mg/dL    Blood Alcohol level:  Lab Results  Component Value Date   ETH 17 (H) 12/04/2016   ETH <11 35/57/3220    Metabolic Disorder Labs:  Lab Results  Component Value Date   HGBA1C 5.3 10/13/2013   MPG 105 10/13/2013   No results found for: PROLACTIN Lab Results  Component Value Date   CHOL 140 02/19/2014   TRIG 123 02/19/2014   HDL 39 (L) 02/19/2014   CHOLHDL 3.6 02/19/2014   VLDL 25 02/19/2014   LDLCALC 76 02/19/2014   LDLCALC 100 (H) 10/10/2013    Current Medications: Current Facility-Administered Medications  Medication Dose Route Frequency  Provider Last Rate Last Dose  . amLODipine (NORVASC) tablet 10 mg  10 mg Oral Daily Rankin, Shuvon B, NP   10 mg at 12/05/16 0824  . aspirin EC tablet 162 mg  162 mg Oral Daily Lindon Romp A, NP   162 mg at 12/05/16 0824  . atenolol (TENORMIN) tablet 100 mg  100 mg Oral QHS Lindon Romp A, NP   100 mg at 12/04/16 2241  . feeding supplement (ENSURE ENLIVE) (ENSURE ENLIVE) liquid 237 mL  237 mL Oral BID BM Lindon Romp A, NP      . gabapentin (NEURONTIN) capsule 400 mg  400 mg Oral Daily Lindon Romp A, NP   400 mg at 12/05/16 0824  . gabapentin (NEURONTIN) capsule 800 mg  800 mg Oral QHS Lindon Romp A, NP   800 mg at 12/04/16 2241  . hydrOXYzine (ATARAX/VISTARIL) tablet 25 mg  25 mg Oral TID PRN Rozetta Nunnery, NP   25 mg at 12/05/16 0836  . Influenza vac split quadrivalent PF (FLUARIX) injection 0.5 mL  0.5 mL Intramuscular Tomorrow-1000 Lindon Romp A, NP      . losartan (COZAAR) tablet 50 mg  50 mg Oral Daily Lindon Romp A, NP   50 mg at 12/05/16 0825  . magnesium hydroxide (MILK OF MAGNESIA) suspension 30 mL  30 mL Oral Daily PRN Rankin, Shuvon B, NP      . nicotine (NICODERM CQ - dosed in mg/24 hours) patch 14 mg  14 mg Transdermal Daily Rankin, Shuvon B, NP   14 mg at 12/05/16 0826  . ondansetron (ZOFRAN) tablet 4 mg  4 mg Oral Q8H PRN Rankin, Shuvon B, NP      . pneumococcal 23 valent vaccine (PNU-IMMUNE) injection 0.5 mL  0.5 mL Intramuscular Tomorrow-1000 Lindon Romp A, NP      . potassium chloride (K-DUR) CR tablet 10 mEq  10 mEq Oral Daily Lindon Romp A, NP   10 mEq at 12/05/16 0824  . pravastatin (PRAVACHOL) tablet 20 mg  20 mg Oral q1800 Lindon Romp A, NP      . traZODone (DESYREL) tablet 50 mg  50 mg Oral QHS,MR X 1 Lindon Romp A, NP       PTA Medications: Prescriptions Prior to Admission  Medication Sig Dispense Refill Last Dose  . potassium chloride (K-DUR) 10 MEQ tablet Take 10 mEq by mouth daily.     Marland Kitchen amLODipine (NORVASC) 10 MG tablet Take 1 tablet (10 mg total) by  mouth daily. 30 tablet 0 12/03/2016 at Unknown time  . aspirin EC 162 MG EC tablet Take 1 tablet (162 mg total) by mouth daily. 60 tablet 0 Past Week at Unknown time  . atenolol (TENORMIN) 100 MG tablet Take 100 mg by mouth daily.   12/03/2016 at 1700  . gabapentin (NEURONTIN) 400 MG capsule Take 400 mg by mouth. Every morning and 800 mg at bedtime.  12/03/2016 at Unknown time  . losartan (COZAAR) 50 MG tablet Take 50 mg by mouth daily.   12/03/2016 at Unknown time  . lovastatin (MEVACOR) 20 MG tablet Take 1 tablet (20 mg total) by mouth at bedtime. 30 tablet 6 12/03/2016 at Unknown time    Musculoskeletal: Strength & Muscle Tone: within normal limits Gait & Station: normal Patient leans: N/A  Psychiatric Specialty Exam: Physical Exam  Constitutional: He is oriented to person, place, and time. He appears well-developed and well-nourished.  HENT:  Head: Normocephalic and atraumatic.  Respiratory: Effort normal.  Neurological: He is alert and oriented to person, place, and time.    ROS  Blood pressure 122/79, pulse 67, temperature 98.6 F (37 C), temperature source Oral, resp. rate 17, height _0  (1.702 m), weight 65.3 kg (144 lb), SpO2 100 %.Body mass index is 22.55 kg/m.  General Appearance: Neatly dressed, mild rhinorrhea. Not shaky, not sweaty, not confused. Not unsteady, normal conjugate eye movements. Not internally distressed. Appropriate behavior.   Eye Contact:  Good  Speech:  Clear and Coherent and Normal Rate  Volume:  Normal  Mood:  Irritable  Affect:  Restricted  Thought Process:  Linear  Orientation:  Full (Time, Place, and Person)  Thought Content:  Rumination  Suicidal Thoughts:  No  Homicidal Thoughts:  No  Memory:  Immediate;   Good Recent;   Fair Remote;   Good  Judgement:  Fair  Insight:  Good  Psychomotor Activity:  Normal  Concentration:  Normal  Recall:  [place] of Knowledge:  Good  Language:  Good  Akathisia:  Negative  Handed:    AIMS (if  indicated):     Assets:  Communication Skills Desire for Improvement Financial Resources/Insurance Resilience  ADL's:  Intact  Cognition:  WNL  Sleep:  Number of Hours: 6.25    Treatment Plan Summary: Patient is coming off multiple substances. He plans to get into long term care post detox. We discussed symptomatic detox protocol. We discussed use of Seroquel to help calm his nerves. Patient consented to treatment after we reviewed the benefits and side effects. Hopeful resolution with five days.   Psychiatric: SUD ( Cocaine, opiates, benzodiazepines and alcohol) SIMD  Medical: CKD CVD HTN HLD  Psychosocial:  Homelessness Limited support  PLAN: 1. Alcohol withdrawal protocol 2. Opiate withdrawal protocol 3. Seroquel 25 mg daily and 50 mg HS 4. Continue home medical medications 5. Encourage unit groups and activities 6. Monitor mood, behavior and interaction with peers 7. Motivational enhancement  8. SW would gather collateral from his family and facilitate aftercare.    Observation Level/Precautions:  Detox 15 minute checks  Laboratory:  Chemistry Profile  Psychotherapy:    Medications:    Consultations:    Discharge Concerns:    Estimated LOS:  Other:     Physician Treatment Plan for Primary Diagnosis: <principal problem not specified> Long Term Goal(s): Improvement in symptoms so as ready for discharge  Short Term Goals: Ability to identify changes in lifestyle to reduce recurrence of condition will improve, Ability to verbalize feelings will improve, Ability to disclose and discuss suicidal ideas, Ability to demonstrate self-control will improve, Ability to identify and develop effective coping behaviors will improve, Ability to maintain clinical measurements within normal limits will improve, Compliance with prescribed medications will improve and Ability to identify triggers associated with substance abuse/mental health issues will improve  Physician  Treatment Plan for Secondary Diagnosis: Active Problems:   MDD (major depressive disorder), recurrent, severe,  with psychosis ([place])  Long Term Goal(s): Improvement in symptoms so as ready for discharge  Short Term Goals: Ability to identify changes in lifestyle to reduce recurrence of condition will improve, Ability to verbalize feelings will improve, Ability to disclose and discuss suicidal ideas, Ability to demonstrate self-control will improve, Ability to identify and develop effective coping behaviors will improve, Ability to maintain clinical measurements within normal limits will improve, Compliance with prescribed medications will improve and Ability to identify triggers associated with substance abuse/mental health issues will improve  I certify that inpatient services furnished can reasonably be expected to improve the patient's condition.    [place], MD 9/15/201810:08 AM

## 2016-12-05 NOTE — [facility] Group Notes (Signed)
[facility] LCSW Group Therapy Note  12/05/2016 at  9:10 to 10:00 AM  Type of Therapy and Topic:  Group Therapy: Avoiding Self-Sabotaging and Enabling Behaviors  Participation Level:  Active   Description of Group The main focus of today's process group to discuss what "self-sabotage" means and use motivational iInterviewing to discuss what benefits, negative or positive, were involved in a self-identified self-sabotaging behavior. We then talked about reasons the patient may want to change the behavior and their current desire to change.   Summary of Patient Progress: Patient shared multiple losses due to his substance use and current despair of ever obtaining balance in his life. Patient was open to redirection when blaming others or ruminating. Patient acknowledges need to avoid isolation and negative thinking.    Therapeutic molalities: Cognitive Behavioral Therapy Person-Centered Therapy Motivational Interviewing  Therapeutic Goals: 1. Patients will demonstrate understanding of the concept of self sabotage 2. Patients will be able to identify pros and cons of their behaviors 3. Patients will be able to identify at least two motivating factors for l of their desire for change   Carney Bern, LCSW

## 2016-12-05 NOTE — [facility] Suicide Risk Assessment (Signed)
[facility] Admission Suicide Risk Assessment   Nursing information obtained from:    Demographic factors:    Current Mental Status:    Loss Factors:    Historical Factors:    Risk Reduction Factors:     Total Time spent with patient: 30 minutes Principal Problem: Substance induced mood disorder (HCC) Diagnosis:   Patient Active Problem List   Diagnosis Date Noted  . Polysubstance abuse [F19.10] 12/04/2016  . Substance induced mood disorder (HCC) [F19.94] 12/04/2016  . 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 MDD (major depressive disorder), recurrent, severe, with psychosis ([facility]CC) [F33.3] 12/04/2016  . Confusion [R41.0] 02/18/2014  . Acute on chronic renal insufficiency [N28.9, N18.9] 02/18/2014  . History of stroke [Z86.73] 10/10/2013  . Seizure (HCC) [R56.9] 10/09/2013  . Essential hypertension, benign [I10] 10/09/2013  . 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 Background history of SUD. Presented to the ER in company of his daughter. Wants to get off street drugs. Has very significant medical history hence need for inpatient detox. UDS was positive for Cocaine and Benzodiazepines. BAL 17 mg/dl. Moderate renal impairment with hyponatremia and hypokalemia. Currently on potassium supplements. Presented with a lot of anger towards his drug dealers. Now able to think through and realized they do not make the substances. Not expressing any thoughts of violence. No homicidal thoughts. No suicidal thoughts. No psychosis currently. Not manic. Patient is coming off substances. Still has some degree of dysphoria. He is reality based. He is cooperative and has some insight. No access to weapons. He is future oriented.   Continued Clinical Symptoms:    The "Alcohol Use Disorders Identification Test", Guidelines for Use in Primary Care, Second  Edition.  World Science writer [facility]). Score between 0-7:  no or low risk or alcohol related problems. Score between 8-15:  moderate risk of alcohol related problems. Score between 16-19:  high risk of alcohol related problems. Score 20 or above:  warrants further diagnostic evaluation for alcohol dependence and treatment.   CLINICAL FACTORS:   Alcohol/Substance Abuse/Dependencies   Musculoskeletal: Strength & Muscle Tone: within normal limits Gait & Station: normal Patient leans: N/A  Psychiatric Specialty Exam: Physical Exam  ROS  Blood pressure 122/79, pulse 67, temperature 98.6 F (37 C), temperature source Oral, resp. rate 17, height  (1.702 m), weight 65.3 kg (144 lb), SpO2 100 %.Body mass index is 22.55 kg/m.  General Appearance: As in H&P  Eye Contact:  As in H&P  Speech:  As in H&P  Volume:  As in H&P  Mood:  As in H&P  Affect:  As in H&P  Thought Process:  As in H&P  Orientation:  As in H&P  Thought Content:  As in H&P  Suicidal Thoughts:  As in H&P  Homicidal Thoughts:  As in H&P  Memory:  As in H&P  Judgement:  As in H&P  Insight:  As in H&P  Psychomotor Activity:  As in H&P  Concentration:  As in H&P  Recall:  As in H&P  Fund of Knowledge:  As in H&P  Language:  As in H&P  Akathisia:  As in H&P  Handed:  As in H&P  AIMS (if indicated):     Assets:  As in H&P  ADL's:  As in H&P  Cognition:  As in H&P  Sleep:  Number of Hours: 6.25  COGNITIVE FEATURES THAT CONTRIBUTE TO RISK:  None    SUICIDE RISK:   Minimal: No identifiable suicidal ideation.  Patients presenting with no risk factors but with morbid ruminations; may be classified as minimal risk based on the severity of the depressive symptoms  PLAN OF CARE:  As in H&P  I certify that inpatient services furnished can reasonably be expected to improve the patient's condition.   Georgiann Cocker, MD 12/05/2016, 10:46 AM

## 2016-12-05 NOTE — Plan of Care (Signed)
Problem: Activity: Goal: Interest or engagement in activities will improve Outcome: Progressing Patient stated this morning that he had social anxiety, and does not like being around crowds for this reason, and had a difficult time interacting with peers earlier in the shift.  This afternoon, pt is more visible in the milieu, interacting with peers and staff.  Problem: Education: Goal: Knowledge of disease or condition will improve Outcome: Progressing Patient has verbalized understanding of his substance use and states that he is motivated to "get better"  Comments: Patient denies SI/HI/AVH, took has taken all of his scheduled so far, and educated on all of his medications and verbalizes understanding.  Patient was anxious and crying earlier in shift, asking this RN: "Why do I feel so sad?".  Pt was medicated with Atarax  for anxiety, and is currently in the day room playing cards with his peers.  Pt reports that his sleep last night was poor even though he had a sleeping aid, reports his appetite as "fair", but is currently getting Ensure shakes, and drank those in the morning and this afternoon as well.  Pt is on PRN Librium for a CIWA over 8, but his highest scrore so far has been 4, and he has not had a need for Librium.    Pt reported that his goal today is "clean mind", and stated he would pray to meet this goal.  Pt is currently calm and cooperative, Q15 minute safety checks are in place, will continue to monitor.
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
## 2016-12-05 NOTE — Progress Notes (Signed)
Adult Psychoeducational Group Note  Date:  12/05/2016 Time:  9:42 PM  Group Topic/Focus:  Wrap-Up Group:   The focus of this group is to help patients review their daily goal of treatment and discuss progress on daily workbooks.  Participation Level:  Active  Participation Quality:  Appropriate  Affect:  Appropriate  Cognitive:  Appropriate  Insight: Appropriate  Engagement in Group:  Engaged  Modes of Intervention:  Discussion  Additional Comments:  Pt attended the AA meeting tonight.  Felipa Furnace 12/05/2016, 9:42 PM

## 2016-12-06 MED ORDER — SERTRALINE HCL 25 MG PO TABS
25.0000 mg | ORAL_TABLET | Freq: Every day | ORAL | Status: DC
  Administered 2016-12-06 – 2016-12-09 (×4): 25 mg via ORAL
  Filled 2016-12-06 (×7): qty 1

## 2016-12-06 NOTE — [facility] Group Notes (Signed)
[facility] LCSW Group Therapy Note  12/06/2016  10:15 to 11 AM   Type of Therapy and Topic: Group Therapy: Feelings Around Returning Home & Establishing a Supportive Framework   Participation Level: Active   Description of Group:  Patients first processed thoughts and feelings about up coming discharge. These included fears of upcoming changes, lack of change, new living environments, judgements and expectations from others and overall stigma of MH issues. We then discussed what is a supportive framework? What does it look like feel like and how do I discern it from and unhealthy non-supportive network? Learn how to cope when supports are not helpful and don't support you. Discuss what to do when your family/friends are not supportive.   Therapeutic Goals Addressed in Processing Group:  1. Patient will identify one healthy supportive network that they can use at discharge. 2. Patient will identify one factor of a supportive framework and how to tell it from an unhealthy network. 3. Patient able to identify one coping skill to use when they do not have positive supports from others. 4. Patient will demonstrate ability to communicate their needs through discussion and/or role plays.  Summary of Patient Progress:  Pt was attentive during group session. As patients processed their anxiety about discharge and described healthy supports patient  Shared acknowledgement of his need to avoid people he normally associates with and processed feelings about leaving the city for an extended period.    Carney Bern, LCSW

## 2016-12-06 NOTE — Progress Notes (Signed)
D.  Pt pleasant on approach, denies complaints at this time.  Pt was positive for evening wrap up group, interacting appropriately with peers on the unit.  Pt denies SI/HI/AVH at this time.  A.  Support and encouragement offered, medication given as ordered  R.  Pt remains safe on the unit, will continue to monitor.   

## 2016-12-06 NOTE — Plan of Care (Signed)
Problem: Education: Goal: Verbalization of understanding the information provided will improve Outcome: Progressing Patient verbalizes understanding of information, education provided.  Problem: Safety: Goal: Periods of time without injury will increase Outcome: Completed/Met Date Met: 12/06/16 Patient has not engaged in self harm, denies thoughts to do so. No SI voiced.

## 2016-12-06 NOTE — Progress Notes (Signed)
D: Patient observed initially up and around unit. More isolative this afternoon . Frequent contacts made 1:1 throughout shift. Patient verbalizes to this Clinical research associate he "feels awful today. I feel sad. I feel awful." Patient's affect flat, sad and anxious with mood depressed and irritable mood. "I don't mean to be so hateful." Per self inventory and discussions with writer, rates depression, hopelessness and anxiety all at a 10/10. Rates sleep as poor, appetite as fair, energy as low and concentration as poor.  States goal for today is to "wake up and see tomorrow." Complained of a headache this AM which he rated at an 8/10, no other physical problems. Denies withdrawal symptoms.   A: Medicated per orders, prn advil given for discomfort. Level III obs in place for safety. Emotional support offered and self inventory reviewed. Encouraged completion of Suicide Safety Plan and programming participation. Discussed POC with MD, SW.  Fall prevention plan in place and reviewed with patient as pt is a high fall risk due to hx of seizures.   R: Patient verbalizes understanding of POC, falls prevention education. On reassess, patient's headache improved to a 5/10. Patient denies SI/HI/AVH and remains safe on level III obs. Will continue to monitor closely and make verbal contact frequently.

## 2016-12-06 NOTE — [facility] Group Notes (Signed)
[facility] Group Notes:  (Nursing/MHT/Case Management/Adjunct)  Date:  12/06/2016  Time:  1330  Type of Therapy:  Nurse Education - Meditation of New Beginnings  Participation Level:  Did Not Attend  Participation Quality:    Affect:    Cognitive:    Insight:    Engagement in Group:    Modes of Intervention:    Summary of Progress/Problems: Patient invited however elected not to attend.  Merian Capron [facility] 12/06/2016, 2:48 PM

## 2016-12-06 NOTE — Progress Notes (Signed)
[facility] MD Progress Note  12/06/2016 12:54 PM Robert Walls  MRN:  [id number] Subjective:   61 y.o Caucasian male, divorced, homeless, on SSID. Background history of SUD. Presented to the ER in company of his daughter. Wants to get off street drugs. Has very significant medical history hence need for inpatient detox. UDS was positive for Cocaine and Benzodiazepines. BAL 17 mg/dl. Moderate renal impairment with hyponatremia and hypokalemia. Currently on potassium supplements.  Chart reviewed today. Patient discussed at team today.  Staff reports that he has been participating at unit activities. No behavioral issues. Vitals and CIWA has been trending down. No internal stimulation.  Seen today, feels a bit down. States  that he had use dreams last night. Not able to tolerate a room with lots of people. Says it is a struggle to go to the cafeteria and the day room. No feeling of being persecuted. No hallucination in any modality. No suicidal thoughts. Says he just has to get into rehab as he does not have any chance of staying sober if he goes home. No longer sure he would be accepted into the place in [place].    Principal Problem: Substance induced mood disorder (HCC) Diagnosis:   Patient Active Problem List   Diagnosis Date Noted  . Polysubstance abuse [F19.10] 12/04/2016  . Substance induced mood disorder (HCC) [F19.94] 12/04/2016  . 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 MDD (major depressive disorder), recurrent, severe, with psychosis (HCC) [F33.3] 12/04/2016  . Confusion [R41.0] 02/18/2014  . Acute on chronic renal insufficiency [N28.9, N18.9] 02/18/2014  . History of stroke [Z86.73] 10/10/2013  . Seizure (HCC) [R56.9] 10/09/2013  . Essential hypertension, benign [I10] 10/09/2013  . Rotator cuff tear arthropathy of right shoulder [M12.811] 06/16/2011   Total Time spent with patient: 20 minutes  Past Psychiatric History: As  in H&P  Past Medical History:  Past Medical History:  Diagnosis Date  . Anxiety   . Aortic atherosclerosis (HCC)    Documented by TEE  . Arthritis   . Back pain   . Concussion 1993  . Essential hypertension, benign   . History of stroke July 2015   Small acute to subacute infarcts - TEE negative for cardioembolic source  . Impaired hearing   . Seizures (HCC)    Dilantin  . Stroke (HCC)   . Substance abuse     Past Surgical History:  Procedure Laterality Date  . BACK SURGERY  1993  . FINGER SURGERY  2003   Left ring finger  . INNER EAR SURGERY     Childhood  . TEE WITHOUT CARDIOVERSION N/A 10/13/2013   Procedure: TRANSESOPHAGEAL ECHOCARDIOGRAM (TEE) WITH PROPOFOL;  Surgeon: Jonelle Sidle, MD;  Location: [facility];  Service: Cardiovascular;  Laterality: N/A;   Family History:  Family History  Problem Relation Age of Onset  . Anesthesia problems Neg Hx   . Hypotension Neg Hx   . Malignant hyperthermia Neg Hx   . Pseudochol deficiency Neg Hx    Family Psychiatric  History: As in H&P Social History:  History  Alcohol Use No    Comment: former- last etoh was June     History  Drug Use  . Types: Marijuana, Cocaine, Heroin    Comment: heroin, crack,    Social History   Social History  . Marital status: Divorced    Spouse name: N/A  . Number of children: N/A  . Years of education: N/A   Social  History Main Topics  . Smoking status: Current Every Day Smoker    Packs/day: 1.00    Years: 8.00    Types: Cigarettes  . Smokeless tobacco: Never Used  . Alcohol use No     Comment: former- last etoh was June  . Drug use: Yes    Types: Marijuana, Cocaine, Heroin     Comment: heroin, crack,  . Sexual activity: No   Other Topics Concern  . None   Social History Narrative  . None   Additional Social History:     Sleep: Fair  Appetite:  Good  Current Medications: Current Facility-Administered Medications  Medication Dose Route Frequency Provider Last Rate  Last Dose  . amLODipine (NORVASC) tablet 10 mg  10 mg Oral Daily Rankin, Shuvon B, NP   10 mg at 12/06/16 0826  . aspirin EC tablet 162 mg  162 mg Oral Daily Nira Conn A, NP   162 mg at 12/06/16 0825  . atenolol (TENORMIN) tablet 100 mg  100 mg Oral QHS Nira Conn A, NP   100 mg at 12/05/16 2112  . chlordiazePOXIDE (LIBRIUM) capsule 25 mg  25 mg Oral QID PRN Javontae Marlette A, MD      . feeding supplement (ENSURE ENLIVE) (ENSURE ENLIVE) liquid 237 mL  237 mL Oral BID BM Nira Conn A, NP   237 mL at 12/06/16 0829  . gabapentin (NEURONTIN) capsule 400 mg  400 mg Oral Daily Nira Conn A, NP   400 mg at 12/06/16 0825  . gabapentin (NEURONTIN) capsule 800 mg  800 mg Oral QHS Nira Conn A, NP   800 mg at 12/05/16 2111  . hydrOXYzine (ATARAX/VISTARIL) tablet 25 mg  25 mg Oral TID PRN Jackelyn Poling, NP   25 mg at 12/05/16 0836  . ibuprofen (ADVIL,MOTRIN) tablet 600 mg  600 mg Oral Q6H PRN Nira Conn A, NP   600 mg at 12/06/16 0916  . losartan (COZAAR) tablet 50 mg  50 mg Oral Daily Nira Conn A, NP   50 mg at 12/06/16 0826  . magnesium hydroxide (MILK OF MAGNESIA) suspension 30 mL  30 mL Oral Daily PRN Rankin, Shuvon B, NP      . nicotine (NICODERM CQ - dosed in mg/24 hours) patch 14 mg  14 mg Transdermal Daily Rankin, Shuvon B, NP   14 mg at 12/06/16 0826  . ondansetron (ZOFRAN) tablet 4 mg  4 mg Oral Q8H PRN Rankin, Shuvon B, NP      . potassium chloride (K-DUR) CR tablet 10 mEq  10 mEq Oral Daily Nira Conn A, NP   10 mEq at 12/06/16 0827  . pravastatin (PRAVACHOL) tablet 20 mg  20 mg Oral q1800 Nira Conn A, NP   20 mg at 12/05/16 2112  . QUEtiapine (SEROQUEL) tablet 25 mg  25 mg Oral Daily Bricen Victory, Delight Ovens, MD   25 mg at 12/06/16 0825  . QUEtiapine (SEROQUEL) tablet 50 mg  50 mg Oral QHS Vraj Denardo, Delight Ovens, MD   50 mg at 12/05/16 2112  . thiamine (VITAMIN B-1) tablet 100 mg  100 mg Oral Daily Jeannemarie Sawaya, Delight Ovens, MD   100 mg at 12/06/16 0825  . traZODone (DESYREL) tablet 50  mg  50 mg Oral QHS,MR X 1 Nira Conn A, NP   50 mg at 12/05/16 2111    Lab Results: No results found for this or any previous visit (from the past 48 hour(s)).  Blood Alcohol level:  Lab Results  Component  Value Date   ETH 17 (H) 12/04/2016   ETH <11 02/18/2014    Metabolic Disorder Labs: Lab Results  Component Value Date   HGBA1C 5.3 10/13/2013   MPG 105 10/13/2013   No results found for: PROLACTIN Lab Results  Component Value Date   CHOL 140 02/19/2014   TRIG 123 02/19/2014   HDL 39 (L) 02/19/2014   CHOLHDL 3.6 02/19/2014   VLDL 25 02/19/2014   LDLCALC 76 02/19/2014   LDLCALC 100 (H) 10/10/2013    Physical Findings: AIMS: Facial and Oral Movements Muscles of Facial Expression: None, normal Lips and Perioral Area: None, normal Jaw: None, normal Tongue: None, normal,Extremity Movements Upper (arms, wrists, hands, fingers): None, normal Lower (legs, knees, ankles, toes): None, normal, Trunk Movements Neck, shoulders, hips: None, normal, Overall Severity Severity of abnormal movements (highest score from questions above): None, normal Incapacitation due to abnormal movements: None, normal Patient's awareness of abnormal movements (rate only patient's report): No Awareness, Dental Status Current problems with teeth and/or dentures?: No Does patient usually wear dentures?: No  CIWA:  CIWA-Ar Total: 3 COWS:     Musculoskeletal: Strength & Muscle Tone: within normal limits Gait & Station: normal Patient leans: N/A  Psychiatric Specialty Exam: Physical Exam  Constitutional: He is oriented to person, place, and time. He appears well-developed and well-nourished.  HENT:  Head: Normocephalic and atraumatic.  Respiratory: Effort normal.  Neurological: He is alert and oriented to person, place, and time.  Psychiatric:  As above    ROS  Blood pressure 137/70, pulse 68, temperature 98 F (36.7 C), temperature source Oral, resp. rate 18, height  (1.702 m),  weight 65.3 kg (144 lb), SpO2 100 %.Body mass index is 22.55 kg/m.  General Appearance: Neatly dressed, calm and cooperative. No tremors. Not internally distracted.   Eye Contact:  Good  Speech:  Clear and Coherent and Normal Rate  Volume:  Normal  Mood:  Less dysphoric today  Affect:  Constricted  Thought Process:  Linear  Orientation:  Full (Time, Place, and Person)  Thought Content:  Worried about relapse. No delusional theme. No preoccupation with violent thoughts. No hallucination in any modality.   Suicidal Thoughts:  No  Homicidal Thoughts:  No  Memory:  Immediate;   Good Recent;   Fair Remote;   Good  Judgement:  Good  Insight:  Good  Psychomotor Activity:  Normal  Concentration:  Concentration: Good and Attention Span: Good  Recall:  Good  Fund of Knowledge:  Good  Language:  Good  Akathisia:  Negative  Handed:    AIMS (if indicated):     Assets:  Communication Skills Desire for Improvement Resilience  ADL's:  Intact  Cognition:  WNL  Sleep:  Number of Hours: 5.5     Treatment Plan Summary: Patient is gradually coming off substances. Still has some cravings but committed to inpatient rehab. Reports some depression. We discussed use of Sertraline to target depression. Consented to treatment after we reviewed the risks and benefits.   Psychiatric: SUD ( Cocaine, opiates, benzodiazepines and alcohol) SIMD  Medical: CKD CVD HTN HLD  Psychosocial:  Homelessness Limited support  PLAN: 1. Sertraline 25 mg daily 2. Continue other medications at current dose 3. Continue to monitor mood, behavior and interaction with peers.    Georgiann Cocker, MD 12/06/2016, 12:54 PM

## 2016-12-06 NOTE — Progress Notes (Signed)
NUTRITION ASSESSMENT  Pt identified as at risk on the Malnutrition Screen Tool  INTERVENTION: 1. Supplements: Continue Ensure Enlive po BID, each supplement provides 350 kcal and 20 grams of protein  NUTRITION DIAGNOSIS: Unintentional weight loss related to sub-optimal intake as evidenced by pt report.   Goal: Pt to meet >/= 90% of their estimated nutrition needs.  Monitor:  PO intake  Assessment:  Pt admitted with substance abuse. Pt eating and drinking supplements. Pt with weight loss but time frame is unclear. Will continue supplement order.  Height: Ht Readings from Last 1 Encounters:  12/04/16  (1.702 m)    Weight: Wt Readings from Last 1 Encounters:  12/04/16 144 lb (65.3 kg)    Weight Hx: Wt Readings from Last 10 Encounters:  12/04/16 144 lb (65.3 kg)  12/03/16 160 lb (72.6 kg)  02/18/14 149 lb 4 oz (67.7 kg)  02/19/14 149 lb (67.6 kg)  02/19/14 149 lb (67.6 kg)  12/11/13 155 lb (70.3 kg)  10/27/13 156 lb (70.8 kg)  10/10/13 155 lb 3.3 oz (70.4 kg)  12/21/12 160 lb (72.6 kg)  06/16/11 171 lb (77.6 kg)    BMI:  Body mass index is 22.55 kg/m. Pt meets criteria for normal based on current BMI.  Estimated Nutritional Needs: Kcal: 25-30 kcal/kg Protein: > 1 gram protein/kg Fluid: 1 ml/kcal  Diet Order: Diet regular Room service appropriate? Yes; Fluid consistency: Thin Pt is also offered choice of unit snacks mid-morning and mid-afternoon.  Pt is eating as desired.   Lab results and medications reviewed.   Tilda Franco, MS, RD, LDN Pager: [phone] After Hours Pager: [phone]

## 2016-12-07 NOTE — Progress Notes (Signed)
DAR NOTE: Patient presents with anxious affect and irritable mood.  Denies suicidal thoughts, auditory and visual hallucinations.  Patient gets easily agitated and irritable when redirected.  Described energy level as low and concentration as poor.  Rates depression at 8, hopelessness at 7, and anxiety at 7.  Maintained on routine safety checks.  Medications given as prescribed.  Support and encouragement offered as needed.  Attended group and participated.  States goal for today is "discharge."  Patient observed socializing with peers in the dayroom.  Vistaril 25 mg given for anxiety with good effect.

## 2016-12-07 NOTE — [facility] Group Notes (Signed)
LCSW Group Therapy Note   12/07/2016 1:15pm   Type of Therapy and Topic:  Group Therapy:  Overcoming Obstacles   Participation Level: Pt invited. Did not attend. Attempted to come into group halfway through, but the door was locked. As someone was getting up to open the door for the pt, pt threw his hands in the air screamed "F**k it" and walked away. Did not return.    Jonathon Jordan, MSW, LCSWA 12/07/2016 5:11 PM

## 2016-12-07 NOTE — Progress Notes (Signed)
[facility] MD Progress Note  12/07/2016 11:53 AM TANYON ALIPIO  MRN:  [id number] Subjective:   61 y.o Caucasian male, divorced, homeless, on SSID. Background history of SUD. Presented to the ER in company of his daughter. Wants to get off street drugs. Has very significant medical history hence need for inpatient detox. UDS was positive for Cocaine and Benzodiazepines. BAL 17 mg/dl. Moderate renal impairment with hyponatremia and hypokalemia. Currently on potassium supplements.  Chart reviewed today. Patient discussed at team today.  Staff reports that he has been pleasant on the unit. He is engaging well with unit groups and activities. He has not voiced any futility thoughts. No behavioral issues.   Seen today, was on the phone while his peers were at group. Was negotiating discharge with person he was talking to. Wants to know if they have found him a place. Reports craving for alcohol. He was asked by staff to end his phone call as it is not allowed during groups. Patient seem upset and refused to engage with me when he dropped the phone   Principal Problem: Substance induced mood disorder (HCC) Diagnosis:   Patient Active Problem List   Diagnosis Date Noted  . Polysubstance abuse [F19.10] 12/04/2016  . Substance induced mood disorder (HCC) [F19.94] 12/04/2016  . 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 MDD (major depressive disorder), recurrent, severe, with psychosis (HCC) [F33.3] 12/04/2016  . Confusion [R41.0] 02/18/2014  . Acute on chronic renal insufficiency [N28.9, N18.9] 02/18/2014  . History of stroke [Z86.73] 10/10/2013  . Seizure (HCC) [R56.9] 10/09/2013  . Essential hypertension, benign [I10] 10/09/2013  . Rotator cuff tear arthropathy of right shoulder [M12.811] 06/16/2011   Total Time spent with patient: 20 minutes  Past Psychiatric History: As in H&P  Past Medical History:  Past Medical History:  Diagnosis Date   . Anxiety   . Aortic atherosclerosis (HCC)    Documented by TEE  . Arthritis   . Back pain   . Concussion 1993  . Essential hypertension, benign   . History of stroke July 2015   Small acute to subacute infarcts - TEE negative for cardioembolic source  . Impaired hearing   . Seizures (HCC)    Dilantin  . Stroke (HCC)   . Substance abuse     Past Surgical History:  Procedure Laterality Date  . BACK SURGERY  1993  . FINGER SURGERY  2003   Left ring finger  . INNER EAR SURGERY     Childhood  . TEE WITHOUT CARDIOVERSION N/A 10/13/2013   Procedure: TRANSESOPHAGEAL ECHOCARDIOGRAM (TEE) WITH PROPOFOL;  Surgeon: Jonelle Sidle, MD;  Location: [facility];  Service: Cardiovascular;  Laterality: N/A;   Family History:  Family History  Problem Relation Age of Onset  . Anesthesia problems Neg Hx   . Hypotension Neg Hx   . Malignant hyperthermia Neg Hx   . Pseudochol deficiency Neg Hx    Family Psychiatric  History: As in H&P Social History:  History  Alcohol Use No    Comment: former- last etoh was June     History  Drug Use  . Types: Marijuana, Cocaine, Heroin    Comment: heroin, crack,    Social History   Social History  . Marital status: Divorced    Spouse name: N/A  . Number of children: N/A  . Years of education: N/A   Social History Main Topics  . Smoking status: Current Every Day Smoker  Packs/day: 1.00    Years: 8.00    Types: Cigarettes  . Smokeless tobacco: Never Used  . Alcohol use No     Comment: former- last etoh was June  . Drug use: Yes    Types: Marijuana, Cocaine, Heroin     Comment: heroin, crack,  . Sexual activity: No   Other Topics Concern  . None   Social History Narrative  . None   Additional Social History:     Sleep: Good  Appetite:  Good  Current Medications: Current Facility-Administered Medications  Medication Dose Route Frequency Provider Last Rate Last Dose  . amLODipine (NORVASC) tablet 10 mg  10 mg Oral Daily  Rankin, Shuvon B, NP   10 mg at 12/07/16 0757  . aspirin EC tablet 162 mg  162 mg Oral Daily Nira Conn A, NP   162 mg at 12/07/16 0757  . atenolol (TENORMIN) tablet 100 mg  100 mg Oral QHS Nira Conn A, NP   100 mg at 12/06/16 2119  . chlordiazePOXIDE (LIBRIUM) capsule 25 mg  25 mg Oral QID PRN Georgiann Cocker, MD   25 mg at 12/06/16 2119  . feeding supplement (ENSURE ENLIVE) (ENSURE ENLIVE) liquid 237 mL  237 mL Oral BID BM Nira Conn A, NP   237 mL at 12/07/16 0802  . gabapentin (NEURONTIN) capsule 400 mg  400 mg Oral Daily Nira Conn A, NP   400 mg at 12/07/16 0757  . gabapentin (NEURONTIN) capsule 800 mg  800 mg Oral QHS Nira Conn A, NP   800 mg at 12/06/16 2119  . hydrOXYzine (ATARAX/VISTARIL) tablet 25 mg  25 mg Oral TID PRN Nira Conn A, NP   25 mg at 12/06/16 2119  . ibuprofen (ADVIL,MOTRIN) tablet 600 mg  600 mg Oral Q6H PRN Nira Conn A, NP   600 mg at 12/07/16 0801  . losartan (COZAAR) tablet 50 mg  50 mg Oral Daily Nira Conn A, NP   50 mg at 12/07/16 0757  . magnesium hydroxide (MILK OF MAGNESIA) suspension 30 mL  30 mL Oral Daily PRN Rankin, Shuvon B, NP      . nicotine (NICODERM CQ - dosed in mg/24 hours) patch 14 mg  14 mg Transdermal Daily Rankin, Shuvon B, NP   14 mg at 12/06/16 0826  . ondansetron (ZOFRAN) tablet 4 mg  4 mg Oral Q8H PRN Rankin, Shuvon B, NP      . potassium chloride (K-DUR) CR tablet 10 mEq  10 mEq Oral Daily Nira Conn A, NP   10 mEq at 12/07/16 0757  . pravastatin (PRAVACHOL) tablet 20 mg  20 mg Oral q1800 Nira Conn A, NP   20 mg at 12/06/16 1720  . QUEtiapine (SEROQUEL) tablet 25 mg  25 mg Oral Daily Moishy Laday, Delight Ovens, MD   25 mg at 12/07/16 0758  . QUEtiapine (SEROQUEL) tablet 50 mg  50 mg Oral QHS Shanisha Lech, Delight Ovens, MD   50 mg at 12/06/16 2119  . sertraline (ZOLOFT) tablet 25 mg  25 mg Oral Daily Shanyn Preisler, Delight Ovens, MD   25 mg at 12/07/16 0757  . traZODone (DESYREL) tablet 50 mg  50 mg Oral QHS,MR X 1 Nira Conn A, NP   50  mg at 12/06/16 2119    Lab Results: No results found for this or any previous visit (from the past 48 hour(s)).  Blood Alcohol level:  Lab Results  Component Value Date   ETH 17 (H) 12/04/2016   ETH <11  02/18/2014    Metabolic Disorder Labs: Lab Results  Component Value Date   HGBA1C 5.3 10/13/2013   MPG 105 10/13/2013   No results found for: PROLACTIN Lab Results  Component Value Date   CHOL 140 02/19/2014   TRIG 123 02/19/2014   HDL 39 (L) 02/19/2014   CHOLHDL 3.6 02/19/2014   VLDL 25 02/19/2014   LDLCALC 76 02/19/2014   LDLCALC 100 (H) 10/10/2013    Physical Findings: AIMS: Facial and Oral Movements Muscles of Facial Expression: None, normal Lips and Perioral Area: None, normal Jaw: None, normal Tongue: None, normal,Extremity Movements Upper (arms, wrists, hands, fingers): None, normal Lower (legs, knees, ankles, toes): None, normal, Trunk Movements Neck, shoulders, hips: None, normal, Overall Severity Severity of abnormal movements (highest score from questions above): None, normal Incapacitation due to abnormal movements: None, normal Patient's awareness of abnormal movements (rate only patient's report): No Awareness, Dental Status Current problems with teeth and/or dentures?: No Does patient usually wear dentures?: No  CIWA:  CIWA-Ar Total: 3 COWS:     Musculoskeletal: Strength & Muscle Tone: within normal limits Gait & Station: normal Patient leans: N/A  Psychiatric Specialty Exam: Physical Exam  Constitutional: He is oriented to person, place, and time. He appears well-developed and well-nourished.  HENT:  Head: Normocephalic and atraumatic.  Respiratory: Effort normal.  Neurological: He is alert and oriented to person, place, and time.  Psychiatric:  As above    ROS  Blood pressure (!) 155/79, pulse 66, temperature 97.9 F (36.6 C), temperature source Oral, resp. rate 20, height  (1.702 m), weight 65.3 kg (144 lb), SpO2 100 %.Body mass  index is 22.55 kg/m.  General Appearance: Neatly dressed, irritable and refused to engage.   Eye Contact:  Unable to assess at this time.   Speech:  Clear and Coherent and Normal Rate  Volume:  Normal  Mood:  Dysphoric  Affect:  Restricted  Thought Process:  Linear  Orientation:  Full (Time, Place, and Person)  Thought Content:  Unable to assess at this time.    Suicidal Thoughts:  Unable to assess at this time.   Homicidal Thoughts:  Unable to assess at this time.   Memory:  Unable to assess at this time.   Judgement: Unable to assess at this time.   Insight:  Unable to assess at this time.   Psychomotor Activity:  Normal  Concentration:  Unable to assess at this time.   Recall:  Unable to assess at this time.   Fund of Knowledge:  Unable to assess at this time.   Language:  Good  Akathisia:  Negative  Handed:    AIMS (if indicated):     Assets:  Communication Skills Desire for Improvement Resilience  ADL's:  Intact  Cognition:  WNL  Sleep:  Number of Hours: 6.75     Treatment Plan Summary: Patient is dysphoric and refused to engage. Overheard conversation in which he appears frustrated that he is here. Still has craving for alcohol. Needs further evaluation. Hopeful discharge in a day or two.  Psychiatric: SUD ( Cocaine, opiates, benzodiazepines and alcohol) SIMD  Medical: CKD CVD HTN HLD  Psychosocial:  Homelessness Limited support  PLAN: 1. Continue medications at current dose.  2. Continue to monitor mood, behavior and interaction with peers.    Georgiann Cocker, MD 12/07/2016, 11:53 AMPatient ID: [name], male   DOB: 03-24-1955, 61 y.o.   MRN: [id number]

## 2016-12-07 NOTE — Progress Notes (Signed)
Recreation Therapy Notes  Date: 12/07/16 Time: 0930 Location: [address] Day[address]  Group Topic: Stress Management  Goal Area(s) Addresses:  Patient will verbalize importance of using healthy stress management.  Patient will identify positive emotions associated with healthy stress management.   Intervention: Stress Management  Activity :  Meditation.  LRT introduced the stress management technique of meditation.  Patients were to listen and follow along as a meditation played from the Calm app.    Education:  Stress Management, Discharge Planning.   Education Outcome: Acknowledges edcuation/In group clarification offered/Needs additional education  Clinical Observations/Feedback: Pt did not attend group.   Caroll Rancher, LRT/CTRS         Caroll Rancher A 12/07/2016 12:08 PM

## 2016-12-07 NOTE — Plan of Care (Signed)
Problem: Activity: Goal: Interest or engagement in activities will improve Outcome: Progressing Pt has been attending groups on the unit   

## 2016-12-07 NOTE — Tx Team (Signed)
Interdisciplinary Treatment and Diagnostic Plan Update 12/07/2016 Time of Session: 9:30am  Robert Walls  MRN: [id number]  Principal Diagnosis: Substance induced mood disorder (HCC)  Secondary Diagnoses: Principal Problem:   Substance induced mood disorder (HCC) Active Problems:   MDD (major depressive disorder), recurrent, severe, with psychosis (HCC)   Current Medications:  Current Facility-Administered Medications  Medication Dose Route Frequency Provider Last Rate Last Dose  . amLODipine (NORVASC) tablet 10 mg  10 mg Oral Daily Rankin, Shuvon B, NP   10 mg at 12/07/16 0757  . aspirin EC tablet 162 mg  162 mg Oral Daily Nira Conn A, NP   162 mg at 12/07/16 0757  . atenolol (TENORMIN) tablet 100 mg  100 mg Oral QHS Nira Conn A, NP   100 mg at 12/06/16 2119  . chlordiazePOXIDE (LIBRIUM) capsule 25 mg  25 mg Oral QID PRN Georgiann Cocker, MD   25 mg at 12/06/16 2119  . feeding supplement (ENSURE ENLIVE) (ENSURE ENLIVE) liquid 237 mL  237 mL Oral BID BM Nira Conn A, NP   237 mL at 12/07/16 0802  . gabapentin (NEURONTIN) capsule 400 mg  400 mg Oral Daily Nira Conn A, NP   400 mg at 12/07/16 0757  . gabapentin (NEURONTIN) capsule 800 mg  800 mg Oral QHS Nira Conn A, NP   800 mg at 12/06/16 2119  . hydrOXYzine (ATARAX/VISTARIL) tablet 25 mg  25 mg Oral TID PRN Nira Conn A, NP   25 mg at 12/06/16 2119  . ibuprofen (ADVIL,MOTRIN) tablet 600 mg  600 mg Oral Q6H PRN Nira Conn A, NP   600 mg at 12/07/16 0801  . losartan (COZAAR) tablet 50 mg  50 mg Oral Daily Nira Conn A, NP   50 mg at 12/07/16 0757  . magnesium hydroxide (MILK OF MAGNESIA) suspension 30 mL  30 mL Oral Daily PRN Rankin, Shuvon B, NP      . nicotine (NICODERM CQ - dosed in mg/24 hours) patch 14 mg  14 mg Transdermal Daily Rankin, Shuvon B, NP   14 mg at 12/06/16 0826  . ondansetron (ZOFRAN) tablet 4 mg  4 mg Oral Q8H PRN Rankin, Shuvon B, NP      . potassium chloride (K-DUR) CR tablet 10 mEq  10 mEq  Oral Daily Nira Conn A, NP   10 mEq at 12/07/16 0757  . pravastatin (PRAVACHOL) tablet 20 mg  20 mg Oral q1800 Nira Conn A, NP   20 mg at 12/06/16 1720  . QUEtiapine (SEROQUEL) tablet 25 mg  25 mg Oral Daily Izediuno, Delight Ovens, MD   25 mg at 12/07/16 0758  . QUEtiapine (SEROQUEL) tablet 50 mg  50 mg Oral QHS Izediuno, Delight Ovens, MD   50 mg at 12/06/16 2119  . sertraline (ZOLOFT) tablet 25 mg  25 mg Oral Daily Izediuno, Delight Ovens, MD   25 mg at 12/07/16 0757  . traZODone (DESYREL) tablet 50 mg  50 mg Oral QHS,MR X 1 Nira Conn A, NP   50 mg at 12/06/16 2119    PTA Medications: Prescriptions Prior to Admission  Medication Sig Dispense Refill Last Dose  . potassium chloride (K-DUR) 10 MEQ tablet Take 10 mEq by mouth daily.     Marland Kitchen amLODipine (NORVASC) 10 MG tablet Take 1 tablet (10 mg total) by mouth daily. 30 tablet 0 12/03/2016 at Unknown time  . aspirin EC 162 MG EC tablet Take 1 tablet (162 mg total) by mouth daily. 60 tablet 0  Past Week at Unknown time  . atenolol (TENORMIN) 100 MG tablet Take 100 mg by mouth daily.   12/03/2016 at 1700  . gabapentin (NEURONTIN) 400 MG capsule Take 400 mg by mouth. Every morning and 800 mg at bedtime.   12/03/2016 at Unknown time  . losartan (COZAAR) 50 MG tablet Take 50 mg by mouth daily.   12/03/2016 at Unknown time  . lovastatin (MEVACOR) 20 MG tablet Take 1 tablet (20 mg total) by mouth at bedtime. 30 tablet 6 12/03/2016 at Unknown time    Treatment Modalities: Medication Management, Group therapy, Case management,  1 to 1 session with clinician, Psychoeducation, Recreational therapy.  Patient Stressors: [employer] fast streamer difficulties Health problems Substance abuse Traumatic event Patient Strengths: Ability for insight Active sense of humor Average or above average intelligence  Physician Treatment Plan for Primary Diagnosis: Substance induced mood disorder (HCC) Long Term Goal(s): Improvement in symptoms so as ready for  discharge Short Term Goals: Ability to identify changes in lifestyle to reduce recurrence of condition will improve Ability to verbalize feelings will improve Ability to disclose and discuss suicidal ideas Ability to demonstrate self-control will improve Ability to identify and develop effective coping behaviors will improve Ability to maintain clinical measurements within normal limits will improve Compliance with prescribed medications will improve Ability to identify triggers associated with substance abuse/mental health issues will improve Ability to identify changes in lifestyle to reduce recurrence of condition will improve Ability to verbalize feelings will improve Ability to disclose and discuss suicidal ideas Ability to demonstrate self-control will improve Ability to identify and develop effective coping behaviors will improve Ability to maintain clinical measurements within normal limits will improve Compliance with prescribed medications will improve Ability to identify triggers associated with substance abuse/mental health issues will improve  Medication Management: Evaluate patient's response, side effects, and tolerance of medication regimen.  Therapeutic Interventions: 1 to 1 sessions, Unit Group sessions and Medication administration.  Evaluation of Outcomes: Progressing  Physician Treatment Plan for Secondary Diagnosis: Principal Problem:   Substance induced mood disorder (HCC) Active Problems:   MDD (major depressive disorder), recurrent, severe, with psychosis (HCC)  Long Term Goal(s): Improvement in symptoms so as ready for discharge  Short Term Goals: Ability to identify changes in lifestyle to reduce recurrence of condition will improve Ability to verbalize feelings will improve Ability to disclose and discuss suicidal ideas Ability to demonstrate self-control will improve Ability to identify and develop effective coping behaviors will improve Ability to  maintain clinical measurements within normal limits will improve Compliance with prescribed medications will improve Ability to identify triggers associated with substance abuse/mental health issues will improve Ability to identify changes in lifestyle to reduce recurrence of condition will improve Ability to verbalize feelings will improve Ability to disclose and discuss suicidal ideas Ability to demonstrate self-control will improve Ability to identify and develop effective coping behaviors will improve Ability to maintain clinical measurements within normal limits will improve Compliance with prescribed medications will improve Ability to identify triggers associated with substance abuse/mental health issues will improve  Medication Management: Evaluate patient's response, side effects, and tolerance of medication regimen.  Therapeutic Interventions: 1 to 1 sessions, Unit Group sessions and Medication administration.  Evaluation of Outcomes: Progressing  RN Treatment Plan for Primary Diagnosis: Substance induced mood disorder (HCC) Long Term Goal(s): Knowledge of disease and therapeutic regimen to maintain health will improve  Short Term Goals: Ability to participate in decision making will improve and Compliance with prescribed medications will improve  Medication Management: RN will administer medications as ordered by provider, will assess and evaluate patient's response and provide education to patient for prescribed medication. RN will report any adverse and/or side effects to prescribing provider.  Therapeutic Interventions: 1 on 1 counseling sessions, Psychoeducation, Medication administration, Evaluate responses to treatment, Monitor vital signs and CBGs as ordered, Perform/monitor CIWA, COWS, AIMS and Fall Risk screenings as ordered, Perform wound care treatments as ordered.  Evaluation of Outcomes: Progressing  LCSW Treatment Plan for Primary Diagnosis: Substance induced  mood disorder (HCC) Long Term Goal(s): Safe transition to appropriate next level of care at discharge, Engage patient in therapeutic group addressing interpersonal concerns. Short Term Goals: Engage patient in aftercare planning with referrals and resources, Facilitate patient progression through stages of change regarding substance use diagnoses and concerns, Identify triggers associated with mental health/substance abuse issues and Increase skills for wellness and recovery  Therapeutic Interventions: Assess for all discharge needs, 1 to 1 time with Social worker, Explore available resources and support systems, Assess for adequacy in community support network, Educate family and significant other(s) on suicide prevention, Complete Psychosocial Assessment, Interpersonal group therapy.  Evaluation of Outcomes: Progressing  Progress in Treatment: Attending groups: Yes Participating in groups: Yes Taking medication as prescribed: Yes, MD continues to assess for medication changes as needed Toleration medication: Yes, no side effects reported at this time Family/Significant other contact made: No, CSW assessing for appropriate contact Patient understands diagnosis: Developing insight  Discussing patient identified problems/goals with staff: Yes Medical problems stabilized or resolved: Yes Denies suicidal/homicidal ideation: Yes Issues/concerns per patient self-inventory: None Other: N/A  New problem(s) identified: None identified at this time.   New Short Term/Long Term Goal(s): None identified at this time.   Discharge Plan or Barriers: CSW still assessing for appropriate discharge plan.  Reason for Continuation of Hospitalization:  Anxiety  Depression Medication stabilization Suicidal ideation Withdrawal symptoms  Estimated Length of Stay: 1-4 days; Estimated discharge date 12/11/16  Attendees: Patient: 12/07/2016 12:07 PM  Physician: Dr. Jama Flavors 12/07/2016 12:07 PM  Nursing: Jesusita Oka  RN; Rodell Perna, RN 12/07/2016 12:07 PM  RN Care Manager: Onnie Boer, RN 12/07/2016 12:07 PM  Social Worker: Donnelly Stager, LCSWA 12/07/2016 12:07 PM  Recreational Therapist:  12/07/2016 12:07 PM  Other: Armandina Stammer, NP 12/07/2016 12:07 PM  Other:  12/07/2016 12:07 PM  Other: 12/07/2016 12:07 PM  Scribe for Treatment Team: Jonathon Jordan, MSW,LCSWA 12/07/2016 12:07 PM

## 2016-12-07 NOTE — Progress Notes (Signed)
D   Pt is pleasant on approach and cooperative    He continues to complain of poor sleep and said the trazadone is not helpful for him   He endorses some depression and anxiety related to poor sleep and his addiction to drugs   He attended AA meeting and said it was helpful   Discussed sleeping habits and how drugs interfer with the sleep cycle A   Verbal support given   Medications administered and effectiveness monitored   Q 15 min checks R   Pt is safe at present time and receptive to verbal support

## 2016-12-08 DIAGNOSIS — F19929 Other psychoactive substance use, unspecified with intoxication, unspecified: Secondary | ICD-10-CM

## 2016-12-08 DIAGNOSIS — F1420 Cocaine dependence, uncomplicated: Secondary | ICD-10-CM | POA: Clinically undetermined

## 2016-12-08 DIAGNOSIS — F1994 Other psychoactive substance use, unspecified with psychoactive substance-induced mood disorder: Secondary | ICD-10-CM | POA: Clinically undetermined

## 2016-12-08 DIAGNOSIS — G4700 Insomnia, unspecified: Secondary | ICD-10-CM

## 2016-12-08 DIAGNOSIS — F1120 Opioid dependence, uncomplicated: Secondary | ICD-10-CM | POA: Diagnosis present

## 2016-12-08 LAB — BASIC METABOLIC PANEL
ANION GAP: 10 (ref 5–15)
BUN: 35 mg/dL — AB (ref 6–20)
CALCIUM: 9.2 mg/dL (ref 8.9–10.3)
CO2: 28 mmol/L (ref 22–32)
CREATININE: 1.69 mg/dL — AB (ref 0.61–1.24)
Chloride: 99 mmol/L — ABNORMAL LOW (ref 101–111)
GFR calc Af Amer: 49 mL/min — ABNORMAL LOW (ref >60)
GFR, EST NON AFRICAN AMERICAN: 43 mL/min — AB (ref >60)
GLUCOSE: 114 mg/dL — AB (ref 65–99)
Potassium: 4.9 mmol/L (ref 3.5–5.1)
Sodium: 137 mmol/L (ref 135–145)

## 2016-12-08 MED ORDER — HYDROXYZINE HCL 50 MG PO TABS: 50.0000 mg | ORAL_TABLET | Freq: Every evening | ORAL | Status: DC | PRN

## 2016-12-08 MED ORDER — TRAZODONE HCL 50 MG PO TABS: 50.0000 mg | ORAL_TABLET | Freq: Every evening | ORAL | Status: DC | PRN

## 2016-12-08 MED ORDER — QUETIAPINE FUMARATE 100 MG PO TABS
100.0000 mg | ORAL_TABLET | Freq: Every day | ORAL | Status: DC
  Administered 2016-12-08: 100 mg via ORAL
  Filled 2016-12-08 (×3): qty 1

## 2016-12-08 MED ORDER — HYDROXYZINE HCL 25 MG PO TABS
ORAL_TABLET | ORAL | Status: AC
  Filled 2016-12-08: qty 1

## 2016-12-08 MED ORDER — HYDROXYZINE HCL 25 MG PO TABS
25.0000 mg | ORAL_TABLET | Freq: Three times a day (TID) | ORAL | Status: DC
  Administered 2016-12-08 – 2016-12-09 (×4): 25 mg via ORAL
  Filled 2016-12-08 (×8): qty 1

## 2016-12-08 NOTE — Progress Notes (Signed)
Recreation Therapy Notes  Animal-Assisted Activity (AAA) Program Checklist/Progress Notes Patient Eligibility Criteria Checklist & Daily Group note for Rec TxIntervention  Date: 09.18.2018 Time: 2:45pm Location: [address]   AAA/T Program Assumption of Risk Form signed by Patient/ or Parent Legal Guardian Yes  Patient is free of allergies or sever asthma Yes  Patient reports no fear of animals Yes  Patient reports no history of cruelty to animals Yes  Patient understands his/her participation is voluntary Yes  Behavioral Response: Did not attend  [employer], LRT/CTRS          Kajol Crispen L 12/08/2016 2:56 PM

## 2016-12-08 NOTE — Progress Notes (Signed)
DAR NOTE: Patient presents with irritable affect and mood.  Denies auditory and visual hallucinations.  Described energy level as low and concentration as poor.  Rates depression at 7, hopelessness at 5, and anxiety at 8.  Maintained on routine safety checks.  Medications given as prescribed.  Support and encouragement offered as needed.  Attended group and participated.  States goal for today is "to get discharge."  Patient was visible in milieu socializing with peers.  Patient voiced frustration about not being able to find him a rehab program for him.

## 2016-12-08 NOTE — [facility] Group Notes (Signed)
Adult Psychoeducational Group Note  Date:  12/08/2016 Time:  9:28 PM  Group Topic/Focus:  Goals Group:   The focus of this group is to help patients establish daily goals to achieve during treatment and discuss how the patient can incorporate goal setting into their daily lives to aide in recovery.  Participation Level:  Active  Participation Quality:  Appropriate  Affect:  Appropriate  Cognitive:  Alert  Insight: Appropriate  Engagement in Group:  Engaged  Modes of Intervention:  Discussion  Additional Comments:  Pt was alert and appropriate in group discussion. Pt goal for today was to get released but it didn't happen. Pt plans to get discharged tomorrow.  Dellia Nims 12/08/2016, 9:28 PM

## 2016-12-08 NOTE — Progress Notes (Signed)
D   Pt is irritable and negative and continues to have multiple somatic complaints    Before my shift began pt was visiting with his family and called me over with questions    During the process he made the remark "yaw are trying to kill me here" his family looked embarrassed when he said that    He does have a dry cough and a rash on his leg which looks like contact dermatitis   Its a narrow band on his right calf  He denies itching or pain  He also said the vistaril he has been given is running his blood pressure up and he doesn't want it any more  A   Listened to patient's complaints and attempted to reassure him we would keep track of his medications and physical complaints   Verbal support given   Medications administered and effectiveness monitored   Q 15 min checks R   Pt is safe and somewhat receptive to verbal support

## 2016-12-08 NOTE — Progress Notes (Signed)
Pt told that he will not be eligible for [facility] due to having managed medicare. He called his outpatient therapist to inform her of this. CSW spoke with pt's therapist, Alger Memos [phone]) who is assisting with long term placement. She is waiting for call back from [facility] in [place], [place] regarding pt's referral and will be calling CSW back with updates. Pt provided with this information as well.  Trula Slade, MSW, LCSW Clinical Social Worker 12/08/2016 10:41 AM

## 2016-12-08 NOTE — [facility] Suicide Risk Assessment (Signed)
[facility] INPATIENT:  Family/Significant Other Suicide Prevention Education  Suicide Prevention Education:  Patient Refusal for Family/Significant Other Suicide Prevention Education: The patient Robert Walls has refused to provide written consent for family/significant other to be provided Family/Significant Other Suicide Prevention Education during admission and/or prior to discharge.  Physician notified.  SPE completed with pt, as pt refused to consent to family contact. SPI pamphlet provided to pt and pt was encouraged to share information with support network, ask questions, and talk about any concerns relating to SPE. Pt denies access to guns/firearms and verbalized understanding of information provided. Mobile Crisis information also provided to pt.   Nova Evett N Smart LCSW 12/08/2016, 11:03 AM

## 2016-12-08 NOTE — Progress Notes (Signed)
[facility] MD Progress Note  12/08/2016 3:02 PM Robert Walls  MRN:  [id number] Subjective:  Patient states " I am angry and agitated . I am not sure why , I do not know if my medications are working, I am not sleeping.'  Objective:Patient seen and chart reviewed.Discussed patient with treatment team. Pt today seen as agitated , irritable , angry. RN attempted to redirect pt and he continued to demand to be seen by provider. Pt was seen by provider , he reported several sx - like mood lability as well as agitation, thoughts about hurting someone , told Clinical research associate he is working hard to cope with his thoughts. Pt also reported sleep issues. Pt does not understand why we could not find a rehab for him yet.  Discussed medication changes with pt , discussed mindfulness. Pt was able to calm down thereafter . Continue treatment.         Principal Problem: Substance or medication-induced bipolar and related disorder with onset during intoxication [facility]) Diagnosis:   Patient Active Problem List   Diagnosis Date Noted  . Opioid use disorder, severe, dependence (HCC) [F11.20] 12/08/2016  . Cocaine use disorder, severe, dependence (HCC) [F14.20] 12/08/2016  . Substance or medication-induced bipolar and related disorder with onset during intoxication (HCC) [F19.94] 12/08/2016  . Polysubstance abuse [F19.10] 12/04/2016  . 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 Confusion [R41.0] 02/18/2014  . Acute on chronic renal insufficiency [N28.9, N18.9] 02/18/2014  . History of stroke [Z86.73] 10/10/2013  . Seizure (HCC) [R56.9] 10/09/2013  . Essential hypertension, benign [I10] 10/09/2013  . Rotator cuff tear arthropathy of right shoulder [M12.811] 06/16/2011   Total Time spent with patient: 30 minutes  Past Psychiatric History: Please see H&P.  Past Medical History:  Past Medical History:  Diagnosis Date  . Anxiety   . Aortic atherosclerosis (HCC)     Documented by TEE  . Arthritis   . Back pain   . Concussion 1993  . Essential hypertension, benign   . History of stroke July 2015   Small acute to subacute infarcts - TEE negative for cardioembolic source  . Impaired hearing   . Seizures (HCC)    Dilantin  . Stroke (HCC)   . Substance abuse     Past Surgical History:  Procedure Laterality Date  . BACK SURGERY  1993  . FINGER SURGERY  2003   Left ring finger  . INNER EAR SURGERY     Childhood  . TEE WITHOUT CARDIOVERSION N/A 10/13/2013   Procedure: TRANSESOPHAGEAL ECHOCARDIOGRAM (TEE) WITH PROPOFOL;  Surgeon: Jonelle Sidle, MD;  Location: [facility];  Service: Cardiovascular;  Laterality: N/A;   Family History:  Family History  Problem Relation Age of Onset  . Anesthesia problems Neg Hx   . Hypotension Neg Hx   . Malignant hyperthermia Neg Hx   . Pseudochol deficiency Neg Hx    Family Psychiatric  History: Please see H&P.  Social History: Please see H&P.  History  Alcohol Use No    Comment: former- last etoh was June     History  Drug Use  . Types: Marijuana, Cocaine, Heroin    Comment: heroin, crack,    Social History   Social History  . Marital status: Divorced    Spouse name: N/A  . Number of children: N/A  . Years of education: N/A   Social History Main Topics  . Smoking status: Current Every Day Smoker    Packs/day:  1.00    Years: 8.00    Types: Cigarettes  . Smokeless tobacco: Never Used  . Alcohol use No     Comment: former- last etoh was June  . Drug use: Yes    Types: Marijuana, Cocaine, Heroin     Comment: heroin, crack,  . Sexual activity: No   Other Topics Concern  . None   Social History Narrative  . None   Additional Social History:                         Sleep: Poor  Appetite:  Fair  Current Medications: Current Facility-Administered Medications  Medication Dose Route Frequency Provider Last Rate Last Dose  . amLODipine (NORVASC) tablet 10 mg  10 mg Oral  Daily Rankin, Shuvon B, NP   10 mg at 12/08/16 0755  . aspirin EC tablet 162 mg  162 mg Oral Daily Nira Conn A, NP   162 mg at 12/08/16 0755  . atenolol (TENORMIN) tablet 100 mg  100 mg Oral QHS Nira Conn A, NP   100 mg at 12/07/16 2126  . chlordiazePOXIDE (LIBRIUM) capsule 25 mg  25 mg Oral QID PRN Georgiann Cocker, MD   25 mg at 12/07/16 2126  . feeding supplement (ENSURE ENLIVE) (ENSURE ENLIVE) liquid 237 mL  237 mL Oral BID BM Nira Conn A, NP   237 mL at 12/08/16 1449  . gabapentin (NEURONTIN) capsule 400 mg  400 mg Oral Daily Nira Conn A, NP   400 mg at 12/08/16 0755  . gabapentin (NEURONTIN) capsule 800 mg  800 mg Oral QHS Nira Conn A, NP   800 mg at 12/07/16 2126  . hydrOXYzine (ATARAX/VISTARIL) 25 MG tablet           . hydrOXYzine (ATARAX/VISTARIL) tablet 25 mg  25 mg Oral TID Jomarie Longs, MD   25 mg at 12/08/16 1206  . hydrOXYzine (ATARAX/VISTARIL) tablet 50 mg  50 mg Oral QHS PRN Adriane Gabbert, Levin Bacon, MD      . ibuprofen (ADVIL,MOTRIN) tablet 600 mg  600 mg Oral Q6H PRN Nira Conn A, NP   600 mg at 12/07/16 2129  . losartan (COZAAR) tablet 50 mg  50 mg Oral Daily Nira Conn A, NP   50 mg at 12/08/16 0755  . magnesium hydroxide (MILK OF MAGNESIA) suspension 30 mL  30 mL Oral Daily PRN Rankin, Shuvon B, NP      . nicotine (NICODERM CQ - dosed in mg/24 hours) patch 14 mg  14 mg Transdermal Daily Rankin, Shuvon B, NP   14 mg at 12/06/16 0826  . ondansetron (ZOFRAN) tablet 4 mg  4 mg Oral Q8H PRN Rankin, Shuvon B, NP      . potassium chloride (K-DUR) CR tablet 10 mEq  10 mEq Oral Daily Nira Conn A, NP   10 mEq at 12/08/16 0755  . pravastatin (PRAVACHOL) tablet 20 mg  20 mg Oral q1800 Nira Conn A, NP   20 mg at 12/07/16 1729  . QUEtiapine (SEROQUEL) tablet 100 mg  100 mg Oral QHS Natalynn Pedone, MD      . QUEtiapine (SEROQUEL) tablet 25 mg  25 mg Oral Daily Izediuno, Delight Ovens, MD   25 mg at 12/08/16 0756  . sertraline (ZOLOFT) tablet 25 mg  25 mg Oral Daily  Izediuno, Delight Ovens, MD   25 mg at 12/08/16 0755  . traZODone (DESYREL) tablet 50 mg  50 mg Oral QHS PRN Jomarie Longs, MD  Lab Results: No results found for this or any previous visit (from the past 48 hour(s)).  Blood Alcohol level:  Lab Results  Component Value Date   ETH 17 (H) 12/04/2016   ETH <11 02/18/2014    Metabolic Disorder Labs: Lab Results  Component Value Date   HGBA1C 5.3 10/13/2013   MPG 105 10/13/2013   No results found for: PROLACTIN Lab Results  Component Value Date   CHOL 140 02/19/2014   TRIG 123 02/19/2014   HDL 39 (L) 02/19/2014   CHOLHDL 3.6 02/19/2014   VLDL 25 02/19/2014   LDLCALC 76 02/19/2014   LDLCALC 100 (H) 10/10/2013    Physical Findings: AIMS: Facial and Oral Movements Muscles of Facial Expression: None, normal Lips and Perioral Area: None, normal Jaw: None, normal Tongue: None, normal,Extremity Movements Upper (arms, wrists, hands, fingers): None, normal Lower (legs, knees, ankles, toes): None, normal, Trunk Movements Neck, shoulders, hips: None, normal, Overall Severity Severity of abnormal movements (highest score from questions above): None, normal Incapacitation due to abnormal movements: None, normal Patient's awareness of abnormal movements (rate only patient's report): No Awareness, Dental Status Current problems with teeth and/or dentures?: No Does patient usually wear dentures?: No  CIWA:  CIWA-Ar Total: 3 COWS:     Musculoskeletal: Strength & Muscle Tone: within normal limits Gait & Station: normal Patient leans: N/A  Psychiatric Specialty Exam: Physical Exam  Nursing note and vitals reviewed.   Review of Systems  Psychiatric/Behavioral: Positive for depression and substance abuse. The patient is nervous/anxious and has insomnia.   All other systems reviewed and are negative.   Blood pressure (!) 146/71, pulse (!) 57, temperature 97.7 F (36.5 C), temperature source Oral, resp. rate 20, height   (1.702 m), weight 65.3 kg (144 lb), SpO2 100 %.Body mass index is 22.55 kg/m.  General Appearance: Guarded  Eye Contact:  Fair  Speech:  Normal Rate  Volume:  Increased  Mood:  Angry and Anxious  Affect:  Labile  Thought Process:  Goal Directed and Descriptions of Associations: Circumstantial  Orientation:  Full (Time, Place, and Person)  Thought Content:  Rumination  Suicidal Thoughts:  No  Homicidal Thoughts:  Yes.  without intent/plan  Memory:  Immediate;   Fair Recent;   Fair Remote;   Fair  Judgement:  Impaired  Insight:  Fair  Psychomotor Activity:  Restlessness  Concentration:  Concentration: Fair and Attention Span: Fair  Recall:  [employer] of Knowledge:  Fair  Language:  Fair  Akathisia:  No  Handed:  Right  AIMS (if indicated):     Assets:  Desire for Improvement  ADL's:  Intact  Cognition:  WNL  Sleep:  Number of Hours: 6.5     Treatment Plan Summary:Patient with SUD , presented with SI , currently reports HI in general as well as mood lability , sleep issues. Pt continues to be motivated to go to a substance abuse treatment . Continue treatment.  Daily contact with patient to assess and evaluate symptoms and progress in treatment, Medication management and Plan see below  Neurontin 400 mg po daily and 800 mg po qhs for anxiety sx. Add Vistaril 25 mg po tid for anxiety, 50 mg po qhs prn for anxiety, sleep. Increase Seroquel to 100 mg po qhs for mood sx, sleep. Continue Seroquel 25 mg po daily for psychosis, mood sx. Zoloft 25 mg po daily for affective sx. Pt with Na+ low , K+ - low on 12/04/2016 - will repeat BMP. Will  get EKG FOR QTC monitoring. Will get hba1c, pl, tsh, lipid panel since he is on seroquel. CSW is working on getting patient in to a program for his substance abuse.    Bethanne Mule, MD 12/08/2016, 3:02 PM

## 2016-12-08 NOTE — [facility] Group Notes (Signed)
[facility] Group Therapy 12/08/2016 1:15pm  Type of Therapy: [facility] [employer] Presentation  Participation Level: Active  Participation Quality: Attentive  Affect: Appropriate  Cognitive: Oriented  Insight: Developing/Improving  Engagement in Therapy: Engaged  Modes of Intervention: Discussion, Education and Socialization  Summary of Progress/Problems: [facility] [employer] ([facility]) Speaker came to talk about his personal journey with mental health. The pt processed ways by which to relate to the speaker. MHA speaker provided handouts and educational information pertaining to groups and services offered by the MHA. Pt was engaged in speaker's presentation and was receptive to resources provided.    Robert Bruski N Smart, LCSW 12/08/2016 11:41 AM  

## 2016-12-09 DIAGNOSIS — Z23 Encounter for immunization: Secondary | ICD-10-CM | POA: Diagnosis not present

## 2016-12-09 LAB — LIPID PANEL
CHOL/HDL RATIO: 3.1 ratio
CHOLESTEROL: 168 mg/dL (ref 0–200)
HDL: 55 mg/dL (ref >40)
LDL Cholesterol: 87 mg/dL (ref 0–99)
TRIGLYCERIDES: 131 mg/dL (ref <150)
VLDL: 26 mg/dL (ref 0–40)

## 2016-12-09 LAB — PROLACTIN: PROLACTIN: 7.5 ng/mL (ref 4.0–15.2)

## 2016-12-09 MED ORDER — TRAZODONE HCL 50 MG PO TABS: 50.0000 mg | ORAL_TABLET | Freq: Every evening | ORAL | 0 refills | Status: DC | PRN

## 2016-12-09 MED ORDER — GUAIFENESIN-DM 100-10 MG/5ML PO SYRP
10.0000 mL | ORAL_SOLUTION | ORAL | Status: DC | PRN
  Administered 2016-12-09: 10 mL via ORAL

## 2016-12-09 MED ORDER — QUETIAPINE FUMARATE 25 MG PO TABS: 25.0000 mg | ORAL_TABLET | Freq: Every day | ORAL | 0 refills | Status: DC

## 2016-12-09 MED ORDER — NICOTINE 14 MG/24HR TD PT24: 14.0000 mg | MEDICATED_PATCH | Freq: Every day | TRANSDERMAL | 0 refills | Status: DC

## 2016-12-09 MED ORDER — SERTRALINE HCL 25 MG PO TABS: 25.0000 mg | ORAL_TABLET | Freq: Every day | ORAL | 0 refills | Status: DC

## 2016-12-09 MED ORDER — GABAPENTIN 400 MG PO CAPS: 400.0000 mg | ORAL_CAPSULE | Freq: Every day | ORAL | 0 refills | Status: DC

## 2016-12-09 MED ORDER — QUETIAPINE FUMARATE 100 MG PO TABS: 100.0000 mg | ORAL_TABLET | Freq: Every day | ORAL | 0 refills | Status: DC

## 2016-12-09 MED ORDER — ATENOLOL 100 MG PO TABS: 100.0000 mg | ORAL_TABLET | Freq: Every day | ORAL | 0 refills | Status: DC

## 2016-12-09 MED ORDER — AMLODIPINE BESYLATE 10 MG PO TABS: 10.0000 mg | ORAL_TABLET | Freq: Every day | ORAL | 0 refills | Status: DC

## 2016-12-09 MED ORDER — LOSARTAN POTASSIUM 50 MG PO TABS: 50.0000 mg | ORAL_TABLET | Freq: Every day | ORAL | 0 refills | Status: DC

## 2016-12-09 NOTE — [facility] Suicide Risk Assessment (Signed)
[facility] Discharge Suicide Risk Assessment   Principal Problem: Substance or medication-induced bipolar and related disorder with onset during intoxication [facility]) Discharge Diagnoses:  Patient Active Problem List   Diagnosis Date Noted  . Opioid use disorder, severe, dependence (HCC) [F11.20] 12/08/2016  . Cocaine use disorder, severe, dependence (HCC) [F14.20] 12/08/2016  . Substance or medication-induced bipolar and related disorder with onset during intoxication (HCC) [F19.94] 12/08/2016  . Polysubstance abuse [F19.10] 12/04/2016  . 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 Confusion [R41.0] 02/18/2014  . Acute on chronic renal insufficiency [N28.9, N18.9] 02/18/2014  . History of stroke [Z86.73] 10/10/2013  . Seizure (HCC) [R56.9] 10/09/2013  . Essential hypertension, benign [I10] 10/09/2013  . Rotator cuff tear arthropathy of right shoulder [M12.811] 06/16/2011    Total Time spent with patient: 30 minutes  Musculoskeletal: Strength & Muscle Tone: within normal limits Gait & Station: normal Patient leans: N/A  Psychiatric Specialty Exam: Review of Systems  Psychiatric/Behavioral: Positive for substance abuse. Negative for depression.  All other systems reviewed and are negative.   Blood pressure 132/66, pulse 64, temperature 97.9 F (36.6 C), temperature source Oral, resp. rate 14, height  (1.702 m), weight 65.3 kg (144 lb), SpO2 100 %.Body mass index is 22.55 kg/m.  General Appearance: Casual  Eye Contact::  Fair  Speech:  Normal Rate409  Volume:  Normal  Mood:  Euthymic  Affect:  Congruent  Thought Process:  Goal Directed and Descriptions of Associations: Intact  Orientation:  Full (Time, Place, and Person)  Thought Content:  Logical  Suicidal Thoughts:  No  Homicidal Thoughts:  No  Memory:  Immediate;   Fair Recent;   Fair Remote;   Fair  Judgement:  Fair  Insight:  Fair  Psychomotor Activity:  Normal   Concentration:  Fair  Recall:  [employer] of Knowledge:Fair  Language: Fair  Akathisia:  No  Handed:  Right  AIMS (if indicated):     Assets:  Communication Skills Desire for Improvement  Sleep:  Number of Hours: 6  Cognition: WNL  ADL's:  Intact   Mental Status Per Nursing Assessment::   On Admission:     Demographic Factors:  Male and Caucasian  Loss Factors: NA  Historical Factors: Impulsivity  Risk Reduction Factors:   Positive social support and Positive therapeutic relationship  Continued Clinical Symptoms:  Alcohol/Substance Abuse/Dependencies  Cognitive Features That Contribute To Risk:  None    Suicide Risk:  Minimal: No identifiable suicidal ideation.  Patients presenting with no risk factors but with morbid ruminations; may be classified as minimal risk based on the severity of the depressive symptoms    Plan Of Care/Follow-up recommendations:  Activity:  no restrictions Diet:  regular Tests:  Please follow up on your Creatinine level with your PMD in a week. Other:  none  Kiwanna Spraker, MD 12/09/2016, 10:22 AM

## 2016-12-09 NOTE — Discharge Summary (Signed)
Physician Discharge Summary Note  Patient:  Robert Walls is an 61 y.o., male MRN:  [id number] DOB:  Jan 13, 1956 Patient phone:  [phone] (home)  Patient address:   [address] [place] [address],  Total Time spent with patient: 30 minutes  Date of Admission:  12/04/2016 Date of Discharge: 12/09/2016  Reason for Admission: Per HPI-61 y.o Caucasian male, divorced, homeless, on [place]. Background history of SUD. Presented to the ER in company of his daughter. Wants to get off street drugs. Has very significant medical history hence need for inpatient detox. UDS was positive for Cocaine and Benzodiazepines. BAL 17 mg/dl. Moderate renal impairment with hyponatremia and hypokalemia. Currently on potassium supplements. At interview, patient reports substance use since his early teens. Says he used LSD and anything available then. Says he progressed over the years to opiates, cocaine and benzodiazepines. Says he is not a heavy drinker. Patient says he has been struggling with the opiates more. Says he is not sure what they are putting in it these days. Says all he wants to do is take it to feel normal. Says he spends all his money on chasing the drugs. He owes his drug dealer [phone]. Says he has a lot of anger towards his dealers. Says he had violent thoughts towards them but has realized that there are down the line on the supply chain. Patient states that he wants to quit using. Says he has no intention of harming anyone. He is not suicidal. Says he is willing to do anything that would keep him away from using. Says his daughter is making arrangements for rehab in [place]. Patient says he cannot go back to [facility] as he would relapse almost immediately.  Reports transient episodes of visual hallucinations while on substances. No hallucination in any modality today. No feeling of impending doom. Not expressing any other form of delusion. Reports irritability and anger towards his lifestyle. Not  pervasively depressed. Quality of sleep is related to substance use. Has good appetite. Normal energy levels. No overwhelming anxiety. No access to weapons. No thoughts of violence. No legal issues. No other stressor.    Principal Problem: Substance or medication-induced bipolar and related disorder with onset during intoxication [facility]) Discharge Diagnoses: Patient Active Problem List   Diagnosis Date Noted  . Opioid use disorder, severe, dependence (HCC) [F11.20] 12/08/2016  . Cocaine use disorder, severe, dependence (HCC) [F14.20] 12/08/2016  . Substance or medication-induced bipolar and related disorder with onset during intoxication (HCC) [F19.94] 12/08/2016  . Polysubstance abuse [F19.10] 12/04/2016  . 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 Confusion [R41.0] 02/18/2014  . Acute on chronic renal insufficiency [N28.9, N18.9] 02/18/2014  . History of stroke [Z86.73] 10/10/2013  . Seizure (HCC) [R56.9] 10/09/2013  . Essential hypertension, benign [I10] 10/09/2013  . Rotator cuff tear arthropathy of right shoulder [M12.811] 06/16/2011    Past Psychiatric History:   Past Medical History:  Past Medical History:  Diagnosis Date  . Anxiety   . Aortic atherosclerosis (HCC)    Documented by TEE  . Arthritis   . Back pain   . Concussion 1993  . Essential hypertension, benign   . History of stroke July 2015   Small acute to subacute infarcts - TEE negative for cardioembolic source  . Impaired hearing   . Seizures (HCC)    Dilantin  . Stroke (HCC)   . Substance abuse     Past Surgical History:  Procedure Laterality Date  .  BACK SURGERY  1993  . FINGER SURGERY  2003   Left ring finger  . INNER EAR SURGERY     Childhood  . TEE WITHOUT CARDIOVERSION N/A 10/13/2013   Procedure: TRANSESOPHAGEAL ECHOCARDIOGRAM (TEE) WITH PROPOFOL;  Surgeon: Robert Sidle, MD;  Location: [facility];  Service: Cardiovascular;  Laterality: N/A;    Family History:  Family History  Problem Relation Age of Onset  . Anesthesia problems Neg Hx   . Hypotension Neg Hx   . Malignant hyperthermia Neg Hx   . Pseudochol deficiency Neg Hx    Family Psychiatric  History:  Social History:  History  Alcohol Use No    Comment: former- last etoh was June     History  Drug Use  . Types: Marijuana, Cocaine, Heroin    Comment: heroin, crack,    Social History   Social History  . Marital status: Divorced    Spouse name: N/A  . Number of children: N/A  . Years of education: N/A   Social History Main Topics  . Smoking status: Current Every Day Smoker    Packs/day: 1.00    Years: 8.00    Types: Cigarettes  . Smokeless tobacco: Never Used  . Alcohol use No     Comment: former- last etoh was June  . Drug use: Yes    Types: Marijuana, Cocaine, Heroin     Comment: heroin, crack,  . Sexual activity: No   Other Topics Concern  . None   Social History Narrative  . None    Hospital Course:  Robert Walls was admitted for Substance or medication-induced bipolar and related disorder with onset during intoxication [facility])  and crisis management.  Pt was treated discharged with the medications listed below under Medication List.  Medical problems were identified and treated as needed.  Home medications were restarted as appropriate.  Improvement was monitored by observation and Robert Walls 's daily report of symptom reduction.  Emotional and mental status was monitored by daily self-inventory reports completed by Robert Walls and clinical staff.         Robert Walls was evaluated by the treatment team for stability and plans for continued recovery upon discharge. Robert Walls 's motivation was an integral factor for scheduling further treatment. Employment, transportation, bed availability, health status, family support, and any pending legal issues were also considered during hospital stay. Pt was offered further treatment options upon  discharge including but not limited to Residential, Intensive Outpatient, and Outpatient treatment.  Robert Walls will follow up with the services as listed below under Follow Up Information.     Upon completion of this admission the patient was both mentally and medically stable for discharge denying suicidal/homicidal ideation, auditory/visual/tactile hallucinations, delusional thoughts and paranoia.    Robert Walls responded well to treatment with a Seroquel 100 mg QHS and  daly and  Neurontin 400 mg without adverse effects.. Pt demonstrated improvement without reported or observed adverse effects to the point of stability appropriate for outpatient management. Pertinent labs include: BMP, for which outpatient follow-up is necessary for lab recheck as mentioned below. Reviewed CBC, CMP, BAL, and UDS; all unremarkable aside from noted exceptions.   Physical Findings: AIMS: Facial and Oral Movements Muscles of Facial Expression: None, normal Lips and Perioral Area: None, normal Jaw: None, normal Tongue: None, normal,Extremity Movements Upper (arms, wrists, hands, fingers): None, normal Lower (legs, knees, ankles, toes): None, normal, Trunk Movements Neck, shoulders, hips: None, normal,  Overall Severity Severity of abnormal movements (highest score from questions above): None, normal Incapacitation due to abnormal movements: None, normal Patient's awareness of abnormal movements (rate only patient's report): No Awareness, Dental Status Current problems with teeth and/or dentures?: No Does patient usually wear dentures?: No  CIWA:  CIWA-Ar Total: 3 COWS:     Musculoskeletal: Strength & Muscle Tone: within normal limits Gait & Station: normal Patient leans: N/A  Psychiatric Specialty Exam: See SRA by MD Physical Exam  Vitals reviewed. Neurological: He is alert.  Skin: Skin is warm and dry.  Psychiatric: He has a normal mood and affect. His behavior is normal.    Review of  Systems  Psychiatric/Behavioral: Negative for depression (stable) and suicidal ideas. The patient is not nervous/anxious (stable).     Blood pressure 132/66, pulse 64, temperature 97.9 F (36.6 C), temperature source Oral, resp. rate 14, height  (1.702 m), weight 65.3 kg (144 lb), SpO2 100 %.Body mass index is 22.55 kg/m.    Have you used any form of tobacco in the last 30 days? (Cigarettes, Smokeless Tobacco, Cigars, and/or Pipes): Yes  Has this patient used any form of tobacco in the last 30 days? (Cigarettes, Smokeless Tobacco, Cigars, and/or Pipes) Yes, Yes, A prescription for an FDA-approved tobacco cessation medication was offered at discharge and the patient refused  Blood Alcohol level:  Lab Results  Component Value Date   ETH 17 (H) 12/04/2016   ETH <11 02/18/2014    Metabolic Disorder Labs:  Lab Results  Component Value Date   HGBA1C 5.3 10/13/2013   MPG 105 10/13/2013   Lab Results  Component Value Date   PROLACTIN 7.5 12/08/2016   Lab Results  Component Value Date   CHOL 140 02/19/2014   TRIG 123 02/19/2014   HDL 39 (L) 02/19/2014   CHOLHDL 3.6 02/19/2014   VLDL 25 02/19/2014   LDLCALC 76 02/19/2014   LDLCALC 100 (H) 10/10/2013    See Psychiatric Specialty Exam and Suicide Risk Assessment completed by Attending Physician prior to discharge.  Discharge destination:  Other:  [facility]  ([facility])  Is patient on multiple antipsychotic therapies at discharge:  No   Has Patient had three or more failed trials of antipsychotic monotherapy by history:  No  Recommended Plan for Multiple Antipsychotic Therapies: NA  Discharge Instructions    Diet - low sodium heart healthy    Complete by:  As directed    Discharge instructions    Complete by:  As directed    Take all medications as prescribed. Keep all follow-up appointments as scheduled.  Do not consume alcohol or use illegal drugs while on prescription medications. Report any adverse effects  from your medications to your primary care provider promptly.  In the event of recurrent symptoms or worsening symptoms, call 911, a crisis hotline, or go to the nearest emergency department for evaluation.   Increase activity slowly    Complete by:  As directed      Allergies as of 12/09/2016      Reactions   Hctz [hydrochlorothiazide] Other (See Comments)   Depleted sodium, requiring hospitalization (lisinopril-HCTZ combo)      Medication List    STOP taking these medications   aspirin 162 MG EC tablet   potassium chloride 10 MEQ tablet Commonly known as:  K-DUR     TAKE these medications     Indication  amLODipine 10 MG tablet Commonly known as:  NORVASC Take 1 tablet (10 mg total) by  mouth daily.  Indication:  High Blood Pressure Disorder   atenolol 100 MG tablet Commonly known as:  TENORMIN Take 1 tablet (100 mg total) by mouth at bedtime. What changed:  when to take this  Indication:  High Blood Pressure Disorder   gabapentin 400 MG capsule Commonly known as:  NEURONTIN Take 1 capsule (400 mg total) by mouth daily. What changed:  when to take this  additional instructions  Indication:  Agitation   losartan 50 MG tablet Commonly known as:  COZAAR Take 1 tablet (50 mg total) by mouth daily.  Indication:  High Blood Pressure Disorder   lovastatin 20 MG tablet Commonly known as:  MEVACOR Take 1 tablet (20 mg total) by mouth at bedtime.  Indication:  High Amount of Fats in the Blood   nicotine 14 mg/24hr patch Commonly known as:  NICODERM CQ - dosed in mg/24 hours Place 1 patch (14 mg total) onto the skin daily.  Indication:  Nicotine Addiction   QUEtiapine 100 MG tablet Commonly known as:  SEROQUEL Take 1 tablet (100 mg total) by mouth at bedtime.  Indication:  Depressive Phase of Manic-Depression   QUEtiapine 25 MG tablet Commonly known as:  SEROQUEL Take 1 tablet (25 mg total) by mouth daily.  Indication:  Depressive Phase of Manic-Depression    sertraline 25 MG tablet Commonly known as:  ZOLOFT Take 1 tablet (25 mg total) by mouth daily.  Indication:  Generalized Anxiety Disorder   traZODone 50 MG tablet Commonly known as:  DESYREL Take 1 tablet (50 mg total) by mouth at bedtime as needed for sleep.  Indication:  Trouble Sleeping        Follow-up recommendations:  Activity:  as tolertated Diet:  heart healthy Other:  patient reports he has a follow-up with PCP MD Raul Del on Oct 2.  Patietn was encouraged to f/u with Labs/medicaiton   Comments:  Take all medications as prescribed. Keep all follow-up appointments as scheduled.  Do not consume alcohol or use illegal drugs while on prescription medications. Report any adverse effects from your medications to your primary care provider promptly.  In the event of recurrent symptoms or worsening symptoms, call 911, a crisis hotline, or go to the nearest emergency department for evaluation.   Signed: Oneta Rack, NP 12/09/2016, 10:10 AM

## 2016-12-09 NOTE — Progress Notes (Signed)
Recreation Therapy Notes  Date: 12/09/16 Time: 0930 Location: 300 Hall Dayroom  Group Topic: Stress Management  Goal Area(s) Addresses:  Patient will verbalize importance of using healthy stress management.  Patient will identify positive emotions associated with healthy stress management.   Intervention: Stress Management  Activity :  Progressive Muscle Relaxation.  LRT introduced the stress management technique of progressive muscle relaxation.  LRT read a script that instructed patients to tense then relax each muscle group individually.  Patients were to follow along as the script was read to fully engage in the practice.  Education:  Stress Management, Discharge Planning.   Education Outcome: Acknowledges edcuation/In group clarification offered/Needs additional education  Clinical Observations/Feedback: Pt did not attend group.    Lanora Reveron, LRT/CTRS         Louiza Moor A 12/09/2016 12:22 PM 

## 2016-12-09 NOTE — Progress Notes (Addendum)
Pt has phone interview with [facility] in [place] at 11:15AM. He was also provided with [employer] [facility] information-Tee Wallace Cullens called CSW and shared that pt called him and hung up on him after stating that he could not pay rent for the first 2 months because "he owed the dope man." Patient continues to become easily agitated and verbally aggressive at times with staff, but apologizes afterward. Pt plans to discharge today regardless, and may be going to stay with his daughter until he is able to get into an [facility] or treatment. He wants to stay in [place] ([facility] appt made). Pt declined follow-up at [facility] in [place]. "They are terrible. I'm not going back there."   Trula Slade, MSW, LCSW Clinical Social Worker 12/09/2016 11:03 AM   Pt accepted to [facility]. Pt is "scared" and not willing to commit to this program. [facility] referral refaxed so that pt can remain on waitlist. He understands that he will still be discharging today being that he is turning down at bed at the facility he originally requested.   Trula Slade, MSW, LCSW Clinical Social Worker 12/09/2016 11:43 AM   Pt is now wanting to go to [facility] in [place], [place]. He arranged transportation and is asking for discharge.   Trula Slade, MSW, LCSW Clinical Social Worker 12/09/2016 1:19 PM

## 2016-12-09 NOTE — Progress Notes (Signed)
Patient ID: Robert Walls, male   DOB: 1955-11-25, 61 y.o.   MRN: [id number]  DAR: Pt. Denies SI/HI and A/V Hallucinations. He reports sleep is poor, appetite is good, energy level is low, and concentration is good. He rates depression 5/10, hopelessness 4/10, and anxiety 4/10. Patient does report some pain this morning and received PRN medications. He reported coughing and received PRN Robitussin. Support and encouragement provided to the patient. Scheduled medications administered to patient per physician's orders. Patient is seen in the milieu and is interacting with some of his peers. He reports that he is ready for discharge. Q15 minute checks are maintained for safety.

## 2016-12-09 NOTE — [facility] Suicide Risk Assessment (Signed)
[facility] INPATIENT:  Family/Significant Other Suicide Prevention Education  Suicide Prevention Education:  Education Completed; Robert Walls (pt's daughter) [phone] has been identified by the patient as the family member/significant other with whom the patient will be residing, and identified as the person(s) who will aid the patient in the event of a mental health crisis (suicidal ideations/suicide attempt).  With written consent from the patient, the family member/significant other has been provided the following suicide prevention education, prior to the and/or following the discharge of the patient.  The suicide prevention education provided includes the following:  Suicide risk factors  Suicide prevention and interventions  National Suicide Hotline telephone number  [facility] assessment telephone number  [facility] Emergency Assistance 911  [facility] and/or Residential Mobile Crisis Unit telephone number  Request made of family/significant other to:  Remove weapons (e.g., guns, rifles, knives), all items previously/currently identified as safety concern.    Remove drugs/medications (over-the-counter, prescriptions, illicit drugs), all items previously/currently identified as a safety concern.  The family member/significant other verbalizes understanding of the suicide prevention education information provided.  The family member/significant other agrees to remove the items of safety concern listed above.  Robert Walls Smart LCSW 12/09/2016, 11:58 AM

## 2016-12-09 NOTE — Progress Notes (Addendum)
  [facility] Adult Case Management Discharge Plan :  Will you be returning to the same living situation after discharge:  No. Pt either discharging to his daughter's home or to an [facility]. Either way, pt wants to discharge today.  At discharge, do you have transportation home?: Yes,  bus or daugther Do you have the ability to pay for your medications: Yes,  [place] healthcare medicare  Release of information consent forms completed and submitted to medical records by CSW.  Patient to Follow up at: Follow-up Information    [facility] Follow up on 12/15/2016.   Specialty:  Behavioral Health Why:  If you stay in [place], you have follow-up appt on Tuesday at 8:00AM. Please bring insurance card/photo ID. Thank you.  Contact information: [address] [place] [address] [phone]        Patient declined outpatient follow-up in [facility]. Follow up.        [facility] Follow up.   Specialty:  Addiction Medicine Why:  Referral made on your behalf. If interested in going to this facility, please call Shayla in admissions daily to check status of referral/waitlist. Thank you.  Contact information: [address] [place] [address] [phone]         [facility] does not require pt's medical records.   Next level of care provider has access to [facility] Link:no  Safety Planning and Suicide Prevention discussed: Yes,  SPE completed with pt; pt declined to consent to family contact. SPI pamphlet and Mobile Crisis informaton provided. Pt did give consent to speak with his therapist Toya Smothers [phone] who works with him in the community.  Have you used any form of tobacco in the last 30 days? (Cigarettes, Smokeless Tobacco, Cigars, and/or Pipes): Yes  Has patient been referred to the Quitline?: Patient refused referral  Patient has been referred for addiction treatment: Yes  [employer], LCSW 12/09/2016,  11:43 AM

## 2016-12-09 NOTE — Progress Notes (Signed)
Patient ID: Robert Walls, male   DOB: [birth year], 61 y.o.   MRN: [id number]  Discharge Note: Belongings returned to patient at time of discharge. Discharge instructions and medications were reviewed with patient. Patient verbalized understanding of both medications and discharge instructions. Patient discharged to lobby where his ride was waiting. Q15 minute safety checks maintained until time of discharge. No distress upon discharge.

## 2016-12-22 ENCOUNTER — Encounter: Payer: Self-pay | Admitting: General Practice

## 2016-12-22 ENCOUNTER — Ambulatory Visit: Payer: Self-pay | Admitting: Family Medicine

## 2016-12-25 ENCOUNTER — Other Ambulatory Visit: Payer: Self-pay

## 2016-12-25 NOTE — Patient Outreach (Signed)
[facility]) Care Management  12/25/2016  Robert Walls January 16, 1956 [id number]   Medication Adherence call to Robert Walls the reason for this call is because Robert Walls is showing past due under [place] [employer] on his Lovastatin 20 mg and Losartan 50 mg spoke to patient's wife she said patient is at a rehab home at this time.   Lillia Abed CPhT Pharmacy Technician [facility] Care Management Direct Dial [phone]  Fax [phone] [email or web address]

## 2017-04-07 DIAGNOSIS — I10 Essential (primary) hypertension: Secondary | ICD-10-CM | POA: Diagnosis not present

## 2017-06-07 DIAGNOSIS — I10 Essential (primary) hypertension: Secondary | ICD-10-CM | POA: Diagnosis not present

## 2017-06-07 DIAGNOSIS — E559 Vitamin D deficiency, unspecified: Secondary | ICD-10-CM | POA: Diagnosis not present

## 2017-07-21 DIAGNOSIS — E559 Vitamin D deficiency, unspecified: Secondary | ICD-10-CM | POA: Diagnosis not present

## 2017-07-21 DIAGNOSIS — E785 Hyperlipidemia, unspecified: Secondary | ICD-10-CM | POA: Diagnosis not present

## 2017-07-21 DIAGNOSIS — I10 Essential (primary) hypertension: Secondary | ICD-10-CM | POA: Diagnosis not present

## 2017-09-01 ENCOUNTER — Other Ambulatory Visit: Payer: Self-pay

## 2017-09-01 NOTE — Patient Outreach (Signed)
[facility]) Care Management  09/01/2017  Robert CrowBryan K Walls 11/29/1955 [id number]   Medication Adherence call to Mr : Robert LoboBrian Walls patient's telephone number under [facility] belongs to someone else : Epic has a number but no one  answer Mr : Robert Walls is due on Losartan 50 mg under [employer].  Robert AbedAna Walls CPhT Pharmacy Technician [facility] Care Management Direct Dial [phone]  Fax [phone] [email or web address]

## 2017-09-30 DIAGNOSIS — E785 Hyperlipidemia, unspecified: Secondary | ICD-10-CM | POA: Diagnosis not present

## 2017-09-30 DIAGNOSIS — E559 Vitamin D deficiency, unspecified: Secondary | ICD-10-CM | POA: Diagnosis not present

## 2017-09-30 DIAGNOSIS — I10 Essential (primary) hypertension: Secondary | ICD-10-CM | POA: Diagnosis not present

## 2017-10-01 ENCOUNTER — Encounter: Payer: Self-pay | Admitting: *Deleted

## 2017-10-01 ENCOUNTER — Other Ambulatory Visit: Payer: Self-pay | Admitting: *Deleted

## 2017-10-01 NOTE — Patient Outreach (Signed)
[facility]) Care Management  10/01/2017  Robert CrowBryan K Walls 12/12/1955 [id number]  Referral via MD-office-[facility];  Reason:needs LCSW for housing resources:  Telephone call to patient; recording states voice mail has not been set up-unable to leave message. Telephone call to alternate number of daughter-recording states voice mail has not been set up -unable to leave message. HIPPA compliant text message to patient -requesting call back.  [facility] request is alternate numbers for patient; recording states office closed.  Plan; Will send outreach letter. Follow up 2-4 business days.  Robert CanLinda Price [place], RN BSN CCM Care Management Coordinator [facility] Care Management  [phone]

## 2017-10-04 ENCOUNTER — Other Ambulatory Visit: Payer: Self-pay | Admitting: *Deleted

## 2017-10-04 NOTE — Patient Outreach (Signed)
[facility]) Care Management  10/04/2017  Robert Walls 1956/01/06 [id number]  Referral via MD-[facility];  Reason:needs LCSW for housing resources:  Telephone call #2; message states mail box not set up for voice mail; telephone call to alternate number-unable to leave voice mail. Telephone to primary care office; voice mail left requesting call back.  Plan: Follow uo in 2-4 days.  Colleen CanLinda Cadience Bradfield, RN BSN CCM Care Management Coordinator [facility] Care Management  [phone]

## 2017-10-05 ENCOUNTER — Other Ambulatory Visit: Payer: Self-pay | Admitting: *Deleted

## 2017-10-05 NOTE — Patient Outreach (Signed)
[facility]) Care Management  10/05/2017  Robert Walls [birth year] [id number]   Referral via MD-[facility];  Reason:needs LCSW for housing resources:  Received [facility] from [facility] in [facility]. Patty SermonsG[facility]'s office.  Advised [name] of unsuccessful call attempts. Requested alternate number if available. Was advised that office did not have any alternate numbers. Thanked this care coordinator was advising.  Plan: Outreach letter has been sent 7/12. If no response will close case.   Colleen CanLinda Jonell Brumbaugh, RN BSN CCM Care Management Coordinator [facility] Care Management  [phone]

## 2017-10-08 ENCOUNTER — Other Ambulatory Visit: Payer: Self-pay | Admitting: *Deleted

## 2017-10-08 NOTE — Patient Outreach (Signed)
[facility] Care Management  10/08/2017  Robert Walls 1955-06-11 [id number]   Referral via MD-office-[facility];  Reason:needs LCSW for housing resources:  Received message from patient in response to letter.  Telephone to patient; HIPPA verification received.   Patient's major concern now is he "needs housing". States he currently is staying with sister till he can get somewhere to stay. States he is on disability due to hx of several strokes which affected his long term & short term memory in 2014 or 2015. States he owns a care & is able to take himself to MD appointments. Most recent appointment was with primary care provider on 7/11. States blood pressure is under control at 120/60 in MD office. States he manages own medications and takes as prescribed. States he gets prescriptions [facility] at [facility].  Patient voices that his is independent in his care. States he has no relatives that he can stay with. States he has gone to social services but was not able to get any assistance.  States he is not on Medicaid and not sure if he is eligible due to disability income.   Patient requesting help with getting housing /community resources. Consents to referral to Clinical Child psychotherapistocial Worker.   States he currently uses sister's address [place] in [place], [id number]. States only phone that he can be reached on is [phone].     Plan: Refer to Clinical Child psychotherapistocial Worker for housing resources [employer]/community resources. Telephonic signing off.   Robert CanLinda Ardenia Stiner, RN BSN CCM Care Management Coordinator [facility]  [phone]

## 2017-10-13 ENCOUNTER — Other Ambulatory Visit: Payer: Self-pay

## 2017-10-13 NOTE — Patient Outreach (Signed)
[facility]) Care Management  10/13/2017  Robert Walls Jul 02, 1955 [id number]   Initial outreach to Robert Walls regarding referral for housing resources.  Per Robert Walls, he was living with his sister but is no longer able to because her husband is not ok with it.  He has no other family members or friends to live with.  He reported that he went to social services approximately two weeks ago but they were unable to provide any assistance.  Robert Walls reported that he is currently living in his truck.  He said that he has contacted multiple places regarding housing but the only one that he could remember was [facility] and they currently have no bed availability. At one point in the conversation, Robert Walls reported that he wanted to locate a bed until the first week of August and then leave the area.  However, he was not able to say where he would go.  BSW provided Robert Walls with contact information for [facility], [facility], [facility], and [facility].  He agreed to contact these facilities.  BSW will follow up with him again before the end of the week to get an update about availability.    Robert Walls, BSW Social Worker [phone]

## 2017-10-15 ENCOUNTER — Other Ambulatory Visit: Payer: Self-pay

## 2017-10-15 NOTE — Patient Outreach (Addendum)
[facility] Care Management  10/15/2017  Robert Walls October 07, 1955 [id number]  Follow up call to [name] regarding shelter resources that he was provided.  [name] reported that he called all of the shelters but the only response he got was from [facility] which is only for [employer].  BSW suggested going to [facility] and/or [facility] in person but he said he is unable to get there.  BSW and [name] have exhausted all emergency shelter resources.  BSW informed [name] that a list of low income apartments in the [place] areas can be provided for him.  BSW informed him that there will be application fees associated with some of these and that there is likely a wait list for all.  [name] requested that this be mailed to his sisters address.  BSW will follow up with him next week to ensure receipt of these resources.   Update: BSW received message from [name] requesting that housing resources not be mailed and services are being declined.  Per [name] he is "planning to leave the area somehow."  BSW is closing case at this time at the request of the patient.   Malachy ChamberAmber Larnce Schnackenberg, BSW Social Worker [phone]

## 2017-10-22 ENCOUNTER — Ambulatory Visit: Payer: Self-pay

## 2018-01-03 ENCOUNTER — Other Ambulatory Visit (HOSPITAL_COMMUNITY): Payer: Self-pay | Admitting: Internal Medicine

## 2018-01-03 DIAGNOSIS — I10 Essential (primary) hypertension: Secondary | ICD-10-CM | POA: Diagnosis not present

## 2018-01-03 DIAGNOSIS — Z23 Encounter for immunization: Secondary | ICD-10-CM | POA: Diagnosis not present

## 2018-01-03 DIAGNOSIS — M5416 Radiculopathy, lumbar region: Secondary | ICD-10-CM | POA: Diagnosis not present

## 2018-01-03 DIAGNOSIS — E785 Hyperlipidemia, unspecified: Secondary | ICD-10-CM | POA: Diagnosis not present

## 2018-01-18 ENCOUNTER — Ambulatory Visit (HOSPITAL_COMMUNITY)
Admission: RE | Admit: 2018-01-18 | Discharge: 2018-01-18 | Disposition: A | Discharge status: Home / Self Care | Payer: Medicare Other | Source: Ambulatory Visit | Attending: Internal Medicine | Admitting: Internal Medicine

## 2018-01-18 DIAGNOSIS — M48061 Spinal stenosis, lumbar region without neurogenic claudication: Secondary | ICD-10-CM | POA: Insufficient documentation

## 2018-01-18 DIAGNOSIS — M5116 Intervertebral disc disorders with radiculopathy, lumbar region: Secondary | ICD-10-CM | POA: Diagnosis not present

## 2018-01-18 DIAGNOSIS — M5416 Radiculopathy, lumbar region: Secondary | ICD-10-CM

## 2018-01-18 DIAGNOSIS — M4807 Spinal stenosis, lumbosacral region: Secondary | ICD-10-CM | POA: Diagnosis not present

## 2018-01-18 LAB — POCT I-STAT CREATININE: Creatinine, Ser: 1.4 mg/dL — ABNORMAL HIGH (ref 0.61–1.24)

## 2018-01-18 MED ORDER — IOPAMIDOL (ISOVUE-300) INJECTION 61%
100.0000 mL | Freq: Once | INTRAVENOUS | Status: AC | PRN
  Administered 2018-01-18: 100 mL via INTRAVENOUS

## 2018-01-19 ENCOUNTER — Observation Stay (HOSPITAL_COMMUNITY)
Admission: EM | Admit: 2018-01-19 | Discharge: 2018-01-20 | Disposition: A | Discharge status: Home / Self Care | Payer: Medicare Other | Attending: Internal Medicine | Admitting: Internal Medicine

## 2018-01-19 ENCOUNTER — Emergency Department (HOSPITAL_COMMUNITY): Payer: Medicare Other

## 2018-01-19 ENCOUNTER — Other Ambulatory Visit: Payer: Self-pay

## 2018-01-19 ENCOUNTER — Encounter (HOSPITAL_COMMUNITY): Payer: Self-pay

## 2018-01-19 ENCOUNTER — Observation Stay (HOSPITAL_BASED_OUTPATIENT_CLINIC_OR_DEPARTMENT_OTHER): Payer: Medicare Other

## 2018-01-19 DIAGNOSIS — F1490 Cocaine use, unspecified, uncomplicated: Secondary | ICD-10-CM | POA: Insufficient documentation

## 2018-01-19 DIAGNOSIS — Z720 Tobacco use: Secondary | ICD-10-CM | POA: Diagnosis not present

## 2018-01-19 DIAGNOSIS — F17210 Nicotine dependence, cigarettes, uncomplicated: Secondary | ICD-10-CM | POA: Insufficient documentation

## 2018-01-19 DIAGNOSIS — R0789 Other chest pain: Secondary | ICD-10-CM | POA: Diagnosis not present

## 2018-01-19 DIAGNOSIS — Z8673 Personal history of transient ischemic attack (TIA), and cerebral infarction without residual deficits: Secondary | ICD-10-CM | POA: Diagnosis not present

## 2018-01-19 DIAGNOSIS — R079 Chest pain, unspecified: Secondary | ICD-10-CM

## 2018-01-19 DIAGNOSIS — G8929 Other chronic pain: Secondary | ICD-10-CM | POA: Diagnosis not present

## 2018-01-19 DIAGNOSIS — Z79899 Other long term (current) drug therapy: Secondary | ICD-10-CM | POA: Diagnosis not present

## 2018-01-19 DIAGNOSIS — K219 Gastro-esophageal reflux disease without esophagitis: Secondary | ICD-10-CM | POA: Insufficient documentation

## 2018-01-19 DIAGNOSIS — I10 Essential (primary) hypertension: Secondary | ICD-10-CM | POA: Diagnosis not present

## 2018-01-19 DIAGNOSIS — E785 Hyperlipidemia, unspecified: Secondary | ICD-10-CM | POA: Diagnosis not present

## 2018-01-19 DIAGNOSIS — M549 Dorsalgia, unspecified: Secondary | ICD-10-CM | POA: Insufficient documentation

## 2018-01-19 DIAGNOSIS — M539 Dorsopathy, unspecified: Secondary | ICD-10-CM

## 2018-01-19 DIAGNOSIS — R0602 Shortness of breath: Secondary | ICD-10-CM | POA: Diagnosis not present

## 2018-01-19 LAB — CBC WITH DIFFERENTIAL/PLATELET
Abs Immature Granulocytes: 0.03 10*3/uL (ref 0.00–0.07)
Basophils Absolute: 0.1 10*3/uL (ref 0.0–0.1)
Basophils Relative: 1 %
EOS ABS: 0.2 10*3/uL (ref 0.0–0.5)
Eosinophils Relative: 2 %
HEMATOCRIT: 43 % (ref 39.0–52.0)
Hemoglobin: 14.1 g/dL (ref 13.0–17.0)
Immature Granulocytes: 0 %
LYMPHS ABS: 1.8 10*3/uL (ref 0.7–4.0)
Lymphocytes Relative: 20 %
MCH: 31.1 pg (ref 26.0–34.0)
MCHC: 32.8 g/dL (ref 30.0–36.0)
MCV: 94.7 fL (ref 80.0–100.0)
MONOS PCT: 5 %
Monocytes Absolute: 0.5 10*3/uL (ref 0.1–1.0)
Neutro Abs: 6.4 10*3/uL (ref 1.7–7.7)
Neutrophils Relative %: 72 %
Platelets: 247 10*3/uL (ref 150–400)
RBC: 4.54 MIL/uL (ref 4.22–5.81)
RDW: 12.9 % (ref 11.5–15.5)
WBC: 9.1 10*3/uL (ref 4.0–10.5)
nRBC: 0 % (ref 0.0–0.2)

## 2018-01-19 LAB — RAPID URINE DRUG SCREEN, HOSP PERFORMED
Amphetamines: NOT DETECTED
BARBITURATES: NOT DETECTED
BENZODIAZEPINES: POSITIVE — AB
Cocaine: NOT DETECTED
Opiates: POSITIVE — AB
Tetrahydrocannabinol: POSITIVE — AB

## 2018-01-19 LAB — TROPONIN I
Troponin I: <0.03 ng/mL (ref <0.03)
Troponin I: <0.03 ng/mL (ref <0.03)

## 2018-01-19 LAB — HEPATIC FUNCTION PANEL
ALT: 16 U/L (ref 0–44)
AST: 20 U/L (ref 15–41)
Albumin: 3.7 g/dL (ref 3.5–5.0)
Alkaline Phosphatase: 46 U/L (ref 38–126)
BILIRUBIN INDIRECT: 0.6 mg/dL (ref 0.3–0.9)
Bilirubin, Direct: 0.1 mg/dL (ref 0.0–0.2)
TOTAL PROTEIN: 6.9 g/dL (ref 6.5–8.1)
Total Bilirubin: 0.7 mg/dL (ref 0.3–1.2)

## 2018-01-19 LAB — BASIC METABOLIC PANEL
Anion gap: 8 (ref 5–15)
BUN: 16 mg/dL (ref 6–20)
CO2: 25 mmol/L (ref 22–32)
CREATININE: 1.35 mg/dL — AB (ref 0.61–1.24)
Calcium: 9.2 mg/dL (ref 8.9–10.3)
Chloride: 106 mmol/L (ref 98–111)
GFR calc Af Amer: >60 mL/min (ref >60)
GFR, EST NON AFRICAN AMERICAN: 56 mL/min — AB (ref >60)
GLUCOSE: 105 mg/dL — AB (ref 70–99)
Potassium: 3.8 mmol/L (ref 3.5–5.1)
Sodium: 139 mmol/L (ref 135–145)

## 2018-01-19 LAB — ECHOCARDIOGRAM COMPLETE
Height: 65 in
WEIGHTICAEL: 2560 [oz_av]

## 2018-01-19 MED ORDER — HEPARIN SODIUM (PORCINE) 5000 UNIT/ML IJ SOLN
5000.0000 [IU] | Freq: Three times a day (TID) | INTRAMUSCULAR | Status: DC
  Administered 2018-01-19 – 2018-01-20 (×3): 5000 [IU] via SUBCUTANEOUS
  Filled 2018-01-19 (×3): qty 1

## 2018-01-19 MED ORDER — SODIUM CHLORIDE 0.9 % IV BOLUS
1000.0000 mL | Freq: Once | INTRAVENOUS | Status: AC
  Administered 2018-01-19: 1000 mL via INTRAVENOUS

## 2018-01-19 MED ORDER — NITROGLYCERIN 2 % TD OINT
0.5000 [in_us] | TOPICAL_OINTMENT | Freq: Once | TRANSDERMAL | Status: AC
  Administered 2018-01-19: 0.5 [in_us] via TOPICAL
  Filled 2018-01-19: qty 1

## 2018-01-19 MED ORDER — ACETAMINOPHEN 325 MG PO TABS
650.0000 mg | ORAL_TABLET | ORAL | Status: DC | PRN
  Administered 2018-01-19 – 2018-01-20 (×2): 650 mg via ORAL
  Filled 2018-01-19 (×2): qty 2

## 2018-01-19 MED ORDER — ASPIRIN EC 81 MG PO TBEC
81.0000 mg | DELAYED_RELEASE_TABLET | Freq: Every day | ORAL | Status: DC
  Administered 2018-01-19 – 2018-01-20 (×2): 81 mg via ORAL
  Filled 2018-01-19 (×2): qty 1

## 2018-01-19 MED ORDER — PANTOPRAZOLE SODIUM 40 MG PO TBEC
40.0000 mg | DELAYED_RELEASE_TABLET | Freq: Every day | ORAL | Status: DC
  Administered 2018-01-19 – 2018-01-20 (×2): 40 mg via ORAL
  Filled 2018-01-19 (×2): qty 1

## 2018-01-19 MED ORDER — MORPHINE SULFATE (PF) 4 MG/ML IV SOLN
4.0000 mg | Freq: Once | INTRAVENOUS | Status: AC
  Administered 2018-01-19: 4 mg via INTRAVENOUS
  Filled 2018-01-19: qty 1

## 2018-01-19 MED ORDER — METHOCARBAMOL 500 MG PO TABS
500.0000 mg | ORAL_TABLET | Freq: Three times a day (TID) | ORAL | Status: DC | PRN
  Administered 2018-01-19 – 2018-01-20 (×2): 500 mg via ORAL
  Filled 2018-01-19 (×2): qty 1

## 2018-01-19 MED ORDER — NICOTINE 21 MG/24HR TD PT24
21.0000 mg | MEDICATED_PATCH | Freq: Every day | TRANSDERMAL | Status: DC
  Filled 2018-01-19: qty 1

## 2018-01-19 MED ORDER — ALUM & MAG HYDROXIDE-SIMETH 200-200-20 MG/5ML PO SUSP: 30.0000 mL | Freq: Four times a day (QID) | ORAL | Status: DC | PRN

## 2018-01-19 MED ORDER — METHOCARBAMOL 1000 MG/10ML IJ SOLN
500.0000 mg | Freq: Three times a day (TID) | INTRAVENOUS | Status: DC | PRN
  Filled 2018-01-19: qty 5

## 2018-01-19 MED ORDER — NITROGLYCERIN 0.4 MG SL SUBL: 0.4000 mg | SUBLINGUAL_TABLET | SUBLINGUAL | Status: DC | PRN

## 2018-01-19 MED ORDER — ONDANSETRON HCL 4 MG/2ML IJ SOLN: 4.0000 mg | Freq: Four times a day (QID) | INTRAMUSCULAR | Status: DC | PRN

## 2018-01-19 MED ORDER — MORPHINE SULFATE (PF) 2 MG/ML IV SOLN
2.0000 mg | INTRAVENOUS | Status: DC | PRN
  Filled 2018-01-19: qty 1

## 2018-01-19 MED ORDER — PRAVASTATIN SODIUM 40 MG PO TABS
40.0000 mg | ORAL_TABLET | Freq: Every day | ORAL | Status: DC
  Administered 2018-01-19: 40 mg via ORAL
  Filled 2018-01-19: qty 1

## 2018-01-19 MED ORDER — METOPROLOL TARTRATE 25 MG PO TABS
25.0000 mg | ORAL_TABLET | Freq: Two times a day (BID) | ORAL | Status: DC
  Administered 2018-01-19 – 2018-01-20 (×2): 25 mg via ORAL
  Filled 2018-01-19 (×2): qty 1

## 2018-01-19 NOTE — ED Triage Notes (Signed)
Pt reports pain in left chest since yesterday.  Reports has been constant.  Denies radiating pain, denies any sob, n/v.

## 2018-01-19 NOTE — Progress Notes (Signed)
  Echocardiogram 2D Echocardiogram has been performed.  Abbott Jasinski G Kashif Pooler 01/19/2018, 3:11 PM

## 2018-01-19 NOTE — H&P (Signed)
History and Physical    Robert Walls:[id number] DOB: 1955-08-17 DOA: 01/19/2018  Referring MD/NP/PA: Dr. Estell Harpin PCP: Wilson Singer, MD  Patient coming from: home   Chief Complaint: chest pain.  HPI: Robert Walls is a 62 y.o. male with a past medical history significant for anxiety, cholesterol, chronic back pain, hyperlipidemia, hypertension, tobacco abuse and prior history of a stroke; who presented to the emergency department secondary to chest pain.  Patient reports pain walking up approximately 40 hours prior to presentation, dull and stabbing in nature, 8 out of 10 in intensity, constant, no aggravating or relieving factors; patient denies shortness of breath, nausea, vomiting, coughing or any other ask associated factors.  He denies any fever, chills, headaches, hemoptysis, melena, hematochezia, hematemesis, dysuria, abdominal pain, hematuria, focal weakness or any other complaints.    Of note, patient reports using marijuana but expressed no using any other recreational drugs.  Patient expressed intermittent numbness affecting his hands and also sciatica going down his right leg.  These symptoms are not currently present.  In the ED patient has a EKG without specific ischemic changes, UDS positive for opiates, benzodiazepines and marijuana; troponin negative x1; no acute cardiopulmonary process on chest x-ray. Patient with significant relief by the use of nitroglycerin patch and morphine.  [facility] has been contacted to admit patient for ACS rule out. Heart score 4  Past Medical/Surgical History: Past Medical History:  Diagnosis Date  . Anxiety   . Aortic atherosclerosis (HCC)    Documented by TEE  . Arthritis   . Back pain   . Concussion 1993  . Essential hypertension, benign   . History of stroke July 2015   Small acute to subacute infarcts - TEE negative for cardioembolic source  . Impaired hearing   . Seizures (HCC)    Dilantin  . Stroke (HCC)   . Substance abuse  [facility])     Past Surgical History:  Procedure Laterality Date  . BACK SURGERY  1993  . FINGER SURGERY  2003   Left ring finger  . INNER EAR SURGERY     Childhood  . TEE WITHOUT CARDIOVERSION N/A 10/13/2013   Procedure: TRANSESOPHAGEAL ECHOCARDIOGRAM (TEE) WITH PROPOFOL;  Surgeon: Jonelle Sidle, MD;  Location: [facility];  Service: Cardiovascular;  Laterality: N/A;    Social History:  reports that he has been smoking cigarettes. He has a 8.00 pack-year smoking history. He has never used smokeless tobacco. He reports that he has current or past drug history. Drugs: Marijuana, Cocaine, and Heroin. He reports that he does not drink alcohol.  Allergies: Allergies  Allergen Reactions  . Hctz [Hydrochlorothiazide] Other (See Comments)    Depleted sodium, requiring hospitalization (lisinopril-HCTZ combo)  . Lisinopril Nausea And Vomiting    Family History:  Family History  Problem Relation Age of Onset  . Anesthesia problems Neg Hx   . Hypotension Neg Hx   . Malignant hyperthermia Neg Hx   . Pseudochol deficiency Neg Hx     Prior to Admission medications   Medication Sig Start Date End Date Taking? Authorizing Provider  amLODipine (NORVASC) 10 MG tablet Take 1 tablet (10 mg total) by mouth daily. 12/10/16  Yes Oneta Rack, NP  lovastatin (MEVACOR) 20 MG tablet Take 1 tablet (20 mg total) by mouth at bedtime. 12/11/13  Yes Jonelle Sidle, MD  metoprolol tartrate (LOPRESSOR) 25 MG tablet Take 25 mg by mouth 2 (two) times daily. 12/27/17  Yes [provider]  NAPHAZOLINE-ANTAZOLINE OP  Apply to eye. 11/15/10  Yes [provider]    Review of Systems:  Negative except as otherwise mentioned in HPI.   Physical Exam: Vitals:   01/19/18 1400 01/19/18 1500 01/19/18 1611 01/19/18 1639  BP: (!) 108/48 (!) 100/55  129/78  Pulse: 72 (!) 58  66  Resp: (!) 21 18  20   Temp:    98.2 F (36.8 C)  TempSrc:    Oral  SpO2: 99% 94%  100%  Weight:   68.6 kg   Height:   5'  5" (1.651 m)    Constitutional: Afebrile, calm, comfortable; after receiving nitroglycerin paste and morphine in the ED his pain completely resolved.  He expressed some discomfort at the base of his neck; otherwise in no acute distress. Eyes: PERRL, lids and conjunctivae normal, no icterus, no nystagmus. ENMT: Mucous membranes are moist. Posterior pharynx clear of any exudate or lesions.fair dentition. Neck: normal, supple, no masses, no thyromegaly, no JVD Respiratory: clear to auscultation bilaterally, no wheezing, no crackles. Normal respiratory effort. No accessory muscle use.  Cardiovascular: Regular rate and rhythm, no murmurs / rubs / gallops. No extremity edema. 2+ pedal pulses. No carotid bruits.  Abdomen: no tenderness, no masses palpated. No hepatosplenomegaly. Bowel sounds positive.  Musculoskeletal: no clubbing / cyanosis. No joint deformity upper and lower extremities. Good ROM, no contractures. Normal muscle tone.  Skin: no rashes, lesions, ulcers. No induration Neurologic: CN 2-12 grossly intact. Sensation intact, DTR normal. Strength 5/5 in all 4.  Psychiatric: Normal judgment and insight. Alert and oriented x 3. Normal mood.    Labs on Admission: I have personally reviewed the following labs and imaging studies  CBC: Recent Labs  Lab 01/19/18 0931  WBC 9.1  NEUTROABS 6.4  HGB 14.1  HCT 43.0  MCV 94.7  PLT 247   Basic Metabolic Panel: Recent Labs  Lab 01/18/18 0751 01/19/18 0931  NA  --  139  K  --  3.8  CL  --  106  CO2  --  25  GLUCOSE  --  105*  BUN  --  16  CREATININE 1.40* 1.35*  CALCIUM  --  9.2   GFR: Estimated Creatinine Clearance: 50.6 mL/min (A) (by C-G formula based on SCr of 1.35 mg/dL (H)).   Liver Function Tests: Recent Labs  Lab 01/19/18 1229  AST 20  ALT 16  ALKPHOS 46  BILITOT 0.7  PROT 6.9  ALBUMIN 3.7   Cardiac Enzymes: Recent Labs  Lab 01/19/18 0931 01/19/18 1229  TROPONINI <0.03 <0.03   Urine analysis:    Component  Value Date/Time   COLORURINE YELLOW 02/18/2014 1924   APPEARANCEUR CLEAR 02/18/2014 1924   LABSPEC 1.020 02/18/2014 1924   PHURINE 5.5 02/18/2014 1924   GLUCOSEU NEGATIVE 02/18/2014 1924   HGBUR LARGE (A) 02/18/2014 1924   BILIRUBINUR NEGATIVE 02/18/2014 1924   KETONESUR NEGATIVE 02/18/2014 1924   PROTEINUR NEGATIVE 02/18/2014 1924   UROBILINOGEN 0.2 02/18/2014 1924   NITRITE NEGATIVE 02/18/2014 1924   LEUKOCYTESUR NEGATIVE 02/18/2014 1924   Radiological Exams on Admission: Dg Chest 2 View  Result Date: 01/19/2018 CLINICAL DATA:  Shortness of breath and chest pain EXAM: CHEST - 2 VIEW COMPARISON:  February 18, 2014 FINDINGS: There is no edema or consolidation. The heart size and pulmonary vascularity are normal. No adenopathy. There is postoperative change in the lower thoracic and visualized upper lumbar regions. No pneumothorax. IMPRESSION: No edema or consolidation. Electronically Signed   By: Bretta Bang III M.D.  On: 01/19/2018 10:01   Ct Lumbar Spine W Contrast  Result Date: 01/18/2018 CLINICAL DATA:  Lumbar radiculopathy. Low back pain extending into the right lower extremity for 3 years. EXAM: CT LUMBAR SPINE WITH CONTRAST TECHNIQUE: Multidetector CT imaging of the lumbar spine was performed with intravenous contrast administration. CONTRAST:  ISOVUE-300 IOPAMIDOL (ISOVUE-300) INJECTION 61% COMPARISON:  Lumbar myelogram 01/04/2015 FINDINGS: Segmentation: 5 non rib-bearing lumbar type vertebral bodies are present. Alignment: Slight retrolisthesis at L3-4 is stable. AP alignment is otherwise anatomic. Vertebrae: Superior endplate fracture at L1 is remote. The vertebral body heights are otherwise normal. No focal lytic or blastic lesions are present. Mild endplate changes are present at L4-5 and L5-S1. Solid posterior fusion is present T12-L3. Paraspinal and other soft tissues: Multiple left renal cysts are demonstrated. No other solid organ lesions are present.  Atherosclerotic calcifications are present in the aorta. Maximal diameter is 28.5 mm. There is no aneurysm. Disc levels: L1-2: Negative. L2-3: Negative. L3-4: Adjacent level disease is present with slight retrolisthesis. There is broad-based disc bulging. Moderate facet hypertrophy is present. There is no significant stenosis. L4-5 a broad-based disc protrusion is asymmetric to the right. Moderate facet hypertrophy is noted. Mild foraminal narrowing is worse right than left, not significantly changed. L5-S1: A broad-based disc protrusion is present. Advanced facet hypertrophy is worse on the left. Moderate left and mild right subarticular narrowing is present. Severe foraminal narrowing is worse left than right. IMPRESSION: 1. Posterior lumbar fusion T11 through L3 without significant stenosis. 2. 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 3. Mild foraminal narrowing bilaterally at L4-5 is stable. 4. Moderate left and mild right subarticular narrowing at L5-S1. 5. Severe foraminal stenosis bilaterally at L5-S1 is worse on the left. Electronically Signed   By: Marin Roberts M.D.   On: 01/18/2018 10:49    EKG: Independently reviewed.  Nonspecific ST/T wave changes no acute ischemic abnormalities.  Normal rate and sinus rhythm.  Normal axis.  Assessment/Plan 1-chest pain -Heart score of 4; with risk factors including hypertension, hyperlipidemia, tobacco abuse, gender/age. -Patient has never had a stratification before. -Troponin x1- -Will cycle troponin, cycle EKG and monitor on telemetry. -Will check 2D echo and based on results decide inpatient versus outpatient stress test. -Continue beta-blocker, aspirin, statins, PRN nitroglycerin and morphine. -Patient denies shortness of breath and has a normal O2 sat on room air. -will check UDS and lipid panel. -No acute cardiopulmonary process appreciated on chest  x-ray.  2-GERD -Continue PPI  3-chronic back pain -Continue PRN analgesics and will add Robaxin for muscle relaxant effect. -Tylenol also available for mild pain  4-hypertension -Blood pressure stable overall. -Continue metoprolol -holding amlodipine initially  5-prior hx of cocaine use -patient reported been clean for years now; I explained potential side effects of using b-blocker in someone positive for cocaine; patient denied any use recently. -UDS ordered in ED pending  6-tobacco abuse -I have discussed tobacco cessation with the patient.  I have counseled the patient regarding the negative impacts of continued tobacco use including but not limited to lung cancer, COPD, and cardiovascular disease.  I have discussed alternatives to tobacco and modalities that may help facilitate tobacco cessation including but not limited to biofeedback, hypnosis, and medications.  Total time spent with tobacco counseling was 5 minutes -nicotine patch ordered.   DVT prophylaxis: Heparin Code Status: Full code Family Communication: No family at bedside Disposition Plan: Anticipate discharge back home hopefully in  the next 24-48 hours.  Patient admitted to rule out ACS. Consults called: Cardiology curbside (Dr. Wyline Mood). Admission status: Observation, telemetry, LOS less than 2 midnights.   Time Spent: 65 minutes  Vassie Loll MD [facility] Pager [phone]  If 7PM-7AM, please contact night-coverage [email or web address] Password [email or web address]  01/19/2018, 5:42 PM

## 2018-01-19 NOTE — ED Provider Notes (Signed)
[facility] EMERGENCY DEPARTMENT Provider Note   CSN: [id number] Arrival date & time: 01/19/18  4098     History   Chief Complaint Chief Complaint  Patient presents with  . Chest Pain    HPI Robert Walls is a 62 y.o. male with a history of HTN, HLD, CVA, aortic atherosclerosis, polysubstance use, anxiety, thoracic back pain, and seizures who presents to the emergency department with a chief complaint of chest pain.  The patient endorses sudden onset, non-radiating left-sided chest pain that awoke him from sleep 2 nights ago.  Pain has been constant since onset.  He characterizes the pain as sharp and "weird feeling".  Nothing makes the pain better or worse.   He reports associated tingling in his bilateral hands for the last 4 to 5 days that comes and goes and reports he has woken up drench in sweat the last 2 nights.   He denies dyspnea, palpitations, wheezing, cough, URI symptoms, leg swelling, nausea, vomiting, fever, numbness, weakness, or chills.   He reports his brother died from a heart attack after having seizures and being involved in an MVA the day before his pain began.  He also reports that he has been having thoracic back pain that will intermittently radiate down his right leg for several months.  He was seen by his PCP who ordered a CT of the lumbar spine, but states this was not where his pain was located.  He took some hydrocodone that was not prescribed to him for his back pain over the weekend with some improvement.  He does not feel as if his chest pain radiates to his back or is related to his long-standing history of back pain.  He has a history of cocaine use, but has not used in several years.  He denies recent alcohol use or IV drug use.  Reports that he smokes less than a pack a day of cigarettes.  Family history includes his father who can died of congestive heart failure.  He is unsure if he ever had an MI.  He also reports that his paternal grandfather had  greater than 9 MIs in his lifetime.  He is unsure of any cardiovascular disease from the maternal side of his family.  He has been compliant with his home medications.   He was previously seen by Dr. Diona Browner, cardiology in September 2015 after his CVA.  He has not followed up since then.  States he was told that his CVA was secondary to his high cholesterol. Per chart review, he had a TEE in 7/15 with EF of 55-60% and 4mm thick atherosclerotic plaque extending to the descending aorta.  The history is provided by the patient. No language interpreter was used.  Chest Pain   This is a new problem. The current episode started 2 days ago. The problem occurs constantly. The problem has not changed since onset.The pain is associated with rest. Associated symptoms include back pain and diaphoresis. Pertinent negatives include no abdominal pain, no fever, no headaches, no leg pain, no lower extremity edema, no nausea, no numbness, no palpitations, no shortness of breath, no syncope, no vomiting and no weakness. Associated symptoms comments: Paresthesias. He has tried nothing for the symptoms. Risk factors include male gender, smoking/tobacco exposure, stress and substance abuse.    Past Medical History:  Diagnosis Date  . Anxiety   . Aortic atherosclerosis (HCC)    Documented by TEE  . Arthritis   . Back pain   .  Concussion 1993  . Essential hypertension, benign   . History of stroke July 2015   Small acute to subacute infarcts - TEE negative for cardioembolic source  . Impaired hearing   . Seizures (HCC)    Dilantin  . Stroke (HCC)   . Substance abuse [facility])     Patient Active Problem List   Diagnosis Date Noted  . Chest pain 01/19/2018  . Opioid use disorder, severe, dependence (HCC) 12/08/2016  . Cocaine use disorder, severe, dependence (HCC) 12/08/2016  . Substance or medication-induced bipolar and related disorder with onset during intoxication (HCC) 12/08/2016  . Polysubstance abuse  (HCC) 12/04/2016  . MDD (major depressive disorder), single episode, severe with psychosis (HCC) 12/04/2016  . Confusion 02/18/2014  . Acute on chronic renal insufficiency 02/18/2014  . History of stroke 10/10/2013  . Seizure (HCC) 10/09/2013  . Essential hypertension, benign 10/09/2013  . Rotator cuff tear arthropathy of right shoulder 06/16/2011    Past Surgical History:  Procedure Laterality Date  . BACK SURGERY  1993  . FINGER SURGERY  2003   Left ring finger  . INNER EAR SURGERY     Childhood  . TEE WITHOUT CARDIOVERSION N/A 10/13/2013   Procedure: TRANSESOPHAGEAL ECHOCARDIOGRAM (TEE) WITH PROPOFOL;  Surgeon: Jonelle Sidle, MD;  Location: [facility];  Service: Cardiovascular;  Laterality: N/A;        Home Medications    Prior to Admission medications   Medication Sig Start Date End Date Taking? Authorizing Provider  amLODipine (NORVASC) 10 MG tablet Take 1 tablet (10 mg total) by mouth daily. 12/10/16  Yes Oneta Rack, NP  lovastatin (MEVACOR) 20 MG tablet Take 1 tablet (20 mg total) by mouth at bedtime. 12/11/13  Yes Jonelle Sidle, MD  metoprolol tartrate (LOPRESSOR) 25 MG tablet Take 25 mg by mouth 2 (two) times daily. 12/27/17  Yes [provider]  NAPHAZOLINE-ANTAZOLINE OP Apply to eye. 11/15/10  Yes [provider]    Family History Family History  Problem Relation Age of Onset  . Anesthesia problems Neg Hx   . Hypotension Neg Hx   . Malignant hyperthermia Neg Hx   . Pseudochol deficiency Neg Hx     Social History Social History   Tobacco Use  . Smoking status: Current Every Day Smoker    Packs/day: 1.00    Years: 8.00    Pack years: 8.00    Types: Cigarettes  . Smokeless tobacco: Never Used  Substance Use Topics  . Alcohol use: No    Comment: former- last etoh was June  . Drug use: Yes    Types: Marijuana, Cocaine, Heroin    Comment: heroin, crack,     Allergies   Hctz [hydrochlorothiazide] and Lisinopril   Review of  Systems Review of Systems  Constitutional: Positive for diaphoresis. Negative for appetite change and fever.  Respiratory: Negative for shortness of breath.   Cardiovascular: Positive for chest pain. Negative for palpitations and syncope.  Gastrointestinal: Negative for abdominal pain, nausea and vomiting.  Genitourinary: Negative for dysuria.  Musculoskeletal: Positive for back pain.  Skin: Negative for rash.  Allergic/Immunologic: Negative for immunocompromised state.  Neurological: Negative for weakness, numbness and headaches.  Psychiatric/Behavioral: Negative for confusion.   Physical Exam Updated Vital Signs BP (!) 114/58   Pulse 65   Temp 98 F (36.7 C) (Oral)   Resp (!) 21   Ht 5\' 5"  (1.651 m)   Wt 72.6 kg   SpO2 (!) 87%   BMI 26.63 kg/m  Physical Exam  Constitutional: He appears well-developed.  HENT:  Head: Normocephalic.  Eyes: Conjunctivae are normal.  Neck: Neck supple.  Cardiovascular: Normal rate and regular rhythm.  No murmur heard. Pulmonary/Chest: Effort normal.  Abdominal: Soft. He exhibits no distension.  Neurological: He is alert.  Skin: Skin is warm and dry.  Psychiatric: His behavior is normal.  Nursing note and vitals reviewed.  ED Treatments / Results  Labs (all labs ordered are listed, but only abnormal results are displayed) Labs Reviewed  BASIC METABOLIC PANEL - Abnormal; Notable for the following components:      Result Value   Glucose, Bld 105 (*)    Creatinine, Ser 1.35 (*)    GFR calc non Af Amer 56 (*)    All other components within normal limits  TROPONIN I  CBC WITH DIFFERENTIAL/PLATELET  RAPID URINE DRUG SCREEN, HOSP PERFORMED  HIV ANTIBODY (ROUTINE TESTING W REFLEX)  TROPONIN I  TROPONIN I  CBC  CREATININE, SERUM  HEPATIC FUNCTION PANEL    EKG EKG Interpretation  Date/Time:  Wednesday January 19 2018 09:21:25 EDT Ventricular Rate:  71 PR Interval:    QRS Duration: 85 QT Interval:  381 QTC Calculation: 414 R  Axis:   88 Text Interpretation:  Sinus rhythm Borderline right axis deviation Probable anteroseptal infarct, old Confirmed by Bethann Berkshire [phone]) on 01/19/2018 10:21:43 AM   Radiology Dg Chest 2 View  Result Date: 01/19/2018 CLINICAL DATA:  Shortness of breath and chest pain EXAM: CHEST - 2 VIEW COMPARISON:  February 18, 2014 FINDINGS: There is no edema or consolidation. The heart size and pulmonary vascularity are normal. No adenopathy. There is postoperative change in the lower thoracic and visualized upper lumbar regions. No pneumothorax. IMPRESSION: No edema or consolidation. Electronically Signed   By: Bretta Bang III M.D.   On: 01/19/2018 10:01   Ct Lumbar Spine W Contrast  Result Date: 01/18/2018 CLINICAL DATA:  Lumbar radiculopathy. Low back pain extending into the right lower extremity for 3 years. EXAM: CT LUMBAR SPINE WITH CONTRAST TECHNIQUE: Multidetector CT imaging of the lumbar spine was performed with intravenous contrast administration. CONTRAST:  ISOVUE-300 IOPAMIDOL (ISOVUE-300) INJECTION 61% COMPARISON:  Lumbar myelogram 01/04/2015 FINDINGS: Segmentation: 5 non rib-bearing lumbar type vertebral bodies are present. Alignment: Slight retrolisthesis at L3-4 is stable. AP alignment is otherwise anatomic. Vertebrae: Superior endplate fracture at L1 is remote. The vertebral body heights are otherwise normal. No focal lytic or blastic lesions are present. Mild endplate changes are present at L4-5 and L5-S1. Solid posterior fusion is present T12-L3. Paraspinal and other soft tissues: Multiple left renal cysts are demonstrated. No other solid organ lesions are present. Atherosclerotic calcifications are present in the aorta. Maximal diameter is 28.5 mm. There is no aneurysm. Disc levels: L1-2: Negative. L2-3: Negative. L3-4: Adjacent level disease is present with slight retrolisthesis. There is broad-based disc bulging. Moderate facet hypertrophy is present. There is no  significant stenosis. L4-5 a broad-based disc protrusion is asymmetric to the right. Moderate facet hypertrophy is noted. Mild foraminal narrowing is worse right than left, not significantly changed. L5-S1: A broad-based disc protrusion is present. Advanced facet hypertrophy is worse on the left. Moderate left and mild right subarticular narrowing is present. Severe foraminal narrowing is worse left than right. IMPRESSION: 1. Posterior lumbar fusion T11 through L3 without significant stenosis. 2. 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 3. Mild foraminal narrowing bilaterally at L4-5 is stable. 4.  Moderate left and mild right subarticular narrowing at L5-S1. 5. Severe foraminal stenosis bilaterally at L5-S1 is worse on the left. Electronically Signed   By: Marin Roberts M.D.   On: 01/18/2018 10:49    Procedures Procedures (including critical care time)  Medications Ordered in ED Medications  sodium chloride 0.9 % bolus 1,000 mL (1,000 mLs Intravenous New Bag/Given 01/19/18 1114)  acetaminophen (TYLENOL) tablet 650 mg (has no administration in time range)  ondansetron (ZOFRAN) injection 4 mg (has no administration in time range)  nicotine (NICODERM CQ - dosed in mg/24 hours) patch 21 mg (has no administration in time range)  heparin injection 5,000 Units (has no administration in time range)  morphine 2 MG/ML injection 2 mg (has no administration in time range)  alum & mag hydroxide-simeth (MAALOX/MYLANTA) 200-200-20 MG/5ML suspension 30 mL (has no administration in time range)  aspirin EC tablet 81 mg (has no administration in time range)  pantoprazole (PROTONIX) EC tablet 40 mg (has no administration in time range)  nitroGLYCERIN (NITROGLYN) 2 % ointment 0.5 inch (0.5 inches Topical Given 01/19/18 1115)  morphine 4 MG/ML injection 4 mg (4 mg Intravenous Given 01/19/18 1115)     Initial Impression /  Assessment and Plan / ED Course  I have reviewed the triage vital signs and the nursing notes.  Pertinent labs & imaging results that were available during my care of the patient were reviewed by me and considered in my medical decision making (see chart for details).     62 year old male with a history of HTN, HLD, CVA, aortic atherosclerosis, polysubstance use, anxiety, thoracic back pain, and seizures presenting with chest pain that awoke him from sleep 2 nights ago with intermittent diaphoresis and bilateral paresthesias in the hands.  Moderate risk with HEART score based.  The patient did previously have a CVA in 2015 and was seen by Dr. Diona Browner with cardiology where he had a TEE that demonstrated a 4 mm atherosclerotic plaque of the aorta.  Discussed the patient with Dr. Estell Harpin, attending physician.  Despite chest pain being atypical, he does feel that he is at higher risk and would recommend admission.   Spoke with Dr. Wyline Mood with cardiology and Dr. Alferd Patee, hospitalist, who will accept the patient for admission.  Although the patient denies recent cocaine use, will order UDS and patient will be admitted for troponin trend and ECHO.  He was given 1/2 inch of nitroglycerin paste and morphine in the ED and reports his pain has improved from a 10 to a 6 at this time. The patient appears reasonably stabilized for admission considering the current resources, flow, and capabilities available in the ED at this time, and I doubt any other [facility] requiring further screening and/or treatment in the ED prior to admission.   Final Clinical Impressions(s) / ED Diagnoses   Final diagnoses:  None    ED Discharge Orders    None       Barkley Boards, PA-C 01/19/18 1209    Bethann Berkshire, MD 01/19/18 1520

## 2018-01-19 NOTE — ED Notes (Signed)
Moved patient into hospital bed for comfort in ED. Pt and family aware of being held in ED. Pt is comfortable at present.

## 2018-01-19 NOTE — ED Notes (Signed)
Pt transferred to a hospital bed and  Updated that a he was waiting for a room.

## 2018-01-20 DIAGNOSIS — E785 Hyperlipidemia, unspecified: Secondary | ICD-10-CM | POA: Diagnosis not present

## 2018-01-20 DIAGNOSIS — G8929 Other chronic pain: Secondary | ICD-10-CM

## 2018-01-20 DIAGNOSIS — I10 Essential (primary) hypertension: Secondary | ICD-10-CM | POA: Diagnosis not present

## 2018-01-20 DIAGNOSIS — M539 Dorsopathy, unspecified: Secondary | ICD-10-CM

## 2018-01-20 DIAGNOSIS — Z720 Tobacco use: Secondary | ICD-10-CM

## 2018-01-20 DIAGNOSIS — R0789 Other chest pain: Secondary | ICD-10-CM | POA: Diagnosis not present

## 2018-01-20 LAB — LIPID PANEL
CHOLESTEROL: 149 mg/dL (ref 0–200)
HDL: 44 mg/dL (ref >40)
LDL Cholesterol: 83 mg/dL (ref 0–99)
TRIGLYCERIDES: 110 mg/dL (ref <150)
Total CHOL/HDL Ratio: 3.4 RATIO
VLDL: 22 mg/dL (ref 0–40)

## 2018-01-20 LAB — HIV ANTIBODY (ROUTINE TESTING W REFLEX): HIV Screen 4th Generation wRfx: NONREACTIVE

## 2018-01-20 LAB — TROPONIN I

## 2018-01-20 MED ORDER — ASPIRIN 81 MG PO TBEC: 81.0000 mg | DELAYED_RELEASE_TABLET | Freq: Every day | ORAL | 1 refills | Status: DC

## 2018-01-20 MED ORDER — METHOCARBAMOL 500 MG PO TABS: 500.0000 mg | ORAL_TABLET | Freq: Three times a day (TID) | ORAL | 0 refills | Status: DC | PRN

## 2018-01-20 MED ORDER — NICOTINE 21 MG/24HR TD PT24: 21.0000 mg | MEDICATED_PATCH | Freq: Every day | TRANSDERMAL | 1 refills | Status: DC

## 2018-01-20 NOTE — Care Management Obs Status (Signed)
MEDICARE OBSERVATION STATUS NOTIFICATION   Patient Details  Name: Robert Walls MRN: [id number] Date of Birth: [birth year]   Medicare Observation Status Notification Given:  Yes    Effa Yarrow, Chrystine Oiler, RN 01/20/2018, 8:09 AM

## 2018-01-20 NOTE — Discharge Summary (Signed)
Physician Discharge Summary  Robert Walls WUJ:[id number] DOB: [birth year] DOA: 01/19/2018  PCP: Wilson Singer, MD  Admit date: 01/19/2018 Discharge date: 01/20/2018  Time spent: 35 minutes  Recommendations for Outpatient Follow-up:  1. Follow improvement of his back pain and make appropriate referral to pain clinic or orthopedic service for further evaluation and management. 2. Outpatient follow-up on as-needed basis with cardiology service. 3. Reassess blood pressure and make further adjustment to his antihypertensive regimen if needed.   Discharge Diagnoses:  Active Problems:   Essential hypertension   Atypical chest pain   Hyperlipidemia   Tobacco abuse   Other chronic pain   Multilevel degenerative disc disease   Discharge Condition: Stable and improved.  Patient discharged home with instruction to follow-up with PCP in the next 10 days.  Diet recommendation: Heart healthy diet.  Filed Weights   01/19/18 0916 01/19/18 1611  Weight: 72.6 kg 68.6 kg    History of present illness:  62 y.o. male with a past medical history significant for anxiety, cholesterol, chronic back pain, hyperlipidemia, hypertension, tobacco abuse and prior history of a stroke; who presented to the emergency department secondary to chest pain.  Patient reports pain walking up approximately 40 hours prior to presentation, dull and stabbing in nature, 8 out of 10 in intensity, constant, no aggravating or relieving factors; patient denies shortness of breath, nausea, vomiting, coughing or any other ask associated factors.  He denies any fever, chills, headaches, hemoptysis, melena, hematochezia, hematemesis, dysuria, abdominal pain, hematuria, focal weakness or any other complaints.    Of note, patient reports using marijuana but expressed no using any other recreational drugs.  Patient expressed intermittent numbness affecting his hands and also sciatica going down his right leg.  These symptoms are not  currently present.  In the ED patient has a EKG without specific ischemic changes, UDS positive for opiates, benzodiazepines and marijuana; troponin negative x1; no acute cardiopulmonary process on chest x-ray. Patient with significant relief by the use of nitroglycerin patch and morphine.  [facility] has been contacted to admit patient for ACS rule out. Heart score 4.  Hospital Course:  1-atypical chest pain: Noncardiac and most likely musculoskeletal in nature. -Troponin x3- -No acute ischemic changes appreciated on repeat EKG and telemetry. -2D echo reassuring with preserved ejection fraction and, no wall motion abnormalities. -Will continue beta-blocker, aspirin and statins at discharge -Patient has been advised to quit smoking and to follow heart healthy diet.  2-gastroesophageal reflux disease -Continue PPI  3-chronic back pain: In the setting of degenerative disc disease (multilevels affected) -Continue outpatient follow-up with PCP for chronic pain management -Robaxin has been prescribed at discharge  4-hypertension -Overall stable and well-controlled with current regimen -Will continue metoprolol and amlodipine.  5-tobacco abuse and marijuana use -Cessation counseling has been provided -Patient received information about 1-800-weekly now hotline and also information regarding over-the-counter use of nicotine patches.  6-prior history of cocaine use -Patient reports being cocaine free for multiple years -UDS done during this hospitalization was negative for cocaine. -Patient was congratulated and encouraged to keep himself cocaine free.   Procedures:  2-D echo - Left ventricle: The cavity size was normal. Wall thickness was   normal. Systolic function was normal. The estimated ejection   fraction was in the range of 60% to 65%. Wall motion was normal;   there were no regional wall motion abnormalities. - Aortic valve: Valve area (VTI): 1.71 cm^2. Valve area (Vmax):   1.71  cm^2. - Left atrium: The  atrium was mildly dilated. - Right atrium: The atrium was mildly dilated. - Atrial septum: No defect or patent foramen ovale was identified. - Technically adequate study.  Consultations:  Cardiology curbside (no inpatient ischemia work-up needed).  Discharge Exam: Vitals:   01/19/18 2247 01/20/18 0653  BP: (!) 151/77 (!) 157/108  Pulse: 75 77  Resp: 15   Temp: 97.8 F (36.6 C) 98.1 F (36.7 C)  SpO2: 99% 97%    General: Afebrile, no further chest pain, no shortness of breath, no nausea, no vomiting.  Patient continue complaining of back pain (multilevel). Cardiovascular: S1 and S2, no rubs, no gallops, no JVD. Respiratory: Good air movement bilaterally, no wheezing, no crackles, no using accessory muscles. Abdomen: Soft, nontender, nondistended, positive bowel sounds. Extremities: No edema, no cyanosis, no clubbing.  Discharge Instructions   Discharge Instructions    Diet - low sodium heart healthy   Complete by:  As directed    Discharge instructions   Complete by:  As directed    Take medications as prescribed Follow-up with PCP in 10 days Stop smoking and to stop recreational drugs. Keep yourself well-hydrated Follow heart healthy diet.     Allergies as of 01/20/2018      Reactions   Hctz [hydrochlorothiazide] Other (See Comments)   Depleted sodium, requiring hospitalization (lisinopril-HCTZ combo)   Lisinopril Nausea And Vomiting      Medication List    TAKE these medications   amLODipine 10 MG tablet Commonly known as:  NORVASC Take 1 tablet (10 mg total) by mouth daily.   aspirin 81 MG EC tablet Take 1 tablet (81 mg total) by mouth daily. Start taking on:  01/21/2018   lovastatin 20 MG tablet Commonly known as:  MEVACOR Take 1 tablet (20 mg total) by mouth at bedtime.   methocarbamol 500 MG tablet Commonly known as:  ROBAXIN Take 1 tablet (500 mg total) by mouth every 8 (eight) hours as needed for muscle spasms.    metoprolol tartrate 25 MG tablet Commonly known as:  LOPRESSOR Take 25 mg by mouth 2 (two) times daily.   NAPHAZOLINE-ANTAZOLINE OP Apply to eye.   nicotine 21 mg/24hr patch Commonly known as:  NICODERM CQ - dosed in mg/24 hours Place 1 patch (21 mg total) onto the skin daily. Start taking on:  01/21/2018      Allergies  Allergen Reactions  . Hctz [Hydrochlorothiazide] Other (See Comments)    Depleted sodium, requiring hospitalization (lisinopril-HCTZ combo)  . Lisinopril Nausea And Vomiting   Follow-up Information    Wilson Singer, MD. Schedule an appointment as soon as possible for a visit in 10 day(s).   Specialty:  Internal Medicine Contact information: [address] [place] [address] [phone]           The results of significant diagnostics from this hospitalization (including imaging, microbiology, ancillary and laboratory) are listed below for reference.    Significant Diagnostic Studies: Dg Chest 2 View  Result Date: 01/19/2018 CLINICAL DATA:  Shortness of breath and chest pain EXAM: CHEST - 2 VIEW COMPARISON:  February 18, 2014 FINDINGS: There is no edema or consolidation. The heart size and pulmonary vascularity are normal. No adenopathy. There is postoperative change in the lower thoracic and visualized upper lumbar regions. No pneumothorax. IMPRESSION: No edema or consolidation. Electronically Signed   By: Bretta Bang III M.D.   On: 01/19/2018 10:01   Ct Lumbar Spine W Contrast  Result Date: 01/18/2018 CLINICAL DATA:  Lumbar radiculopathy. Low  back pain extending into the right lower extremity for 3 years. EXAM: CT LUMBAR SPINE WITH CONTRAST TECHNIQUE: Multidetector CT imaging of the lumbar spine was performed with intravenous contrast administration. CONTRAST:  ISOVUE-300 IOPAMIDOL (ISOVUE-300) INJECTION 61% COMPARISON:  Lumbar myelogram 01/04/2015 FINDINGS: Segmentation: 5 non rib-bearing lumbar type vertebral bodies are present.  Alignment: Slight retrolisthesis at L3-4 is stable. AP alignment is otherwise anatomic. Vertebrae: Superior endplate fracture at L1 is remote. The vertebral body heights are otherwise normal. No focal lytic or blastic lesions are present. Mild endplate changes are present at L4-5 and L5-S1. Solid posterior fusion is present T12-L3. Paraspinal and other soft tissues: Multiple left renal cysts are demonstrated. No other solid organ lesions are present. Atherosclerotic calcifications are present in the aorta. Maximal diameter is 28.5 mm. There is no aneurysm. Disc levels: L1-2: Negative. L2-3: Negative. L3-4: Adjacent level disease is present with slight retrolisthesis. There is broad-based disc bulging. Moderate facet hypertrophy is present. There is no significant stenosis. L4-5 a broad-based disc protrusion is asymmetric to the right. Moderate facet hypertrophy is noted. Mild foraminal narrowing is worse right than left, not significantly changed. L5-S1: A broad-based disc protrusion is present. Advanced facet hypertrophy is worse on the left. Moderate left and mild right subarticular narrowing is present. Severe foraminal narrowing is worse left than right. IMPRESSION: 1. Posterior lumbar fusion T11 through L3 without significant stenosis. 2. 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 3. Mild foraminal narrowing bilaterally at L4-5 is stable. 4. Moderate left and mild right subarticular narrowing at L5-S1. 5. Severe foraminal stenosis bilaterally at L5-S1 is worse on the left. Electronically Signed   By: Marin Roberts M.D.   On: 01/18/2018 10:49   Labs: Basic Metabolic Panel: Recent Labs  Lab 01/18/18 0751 01/19/18 0931  NA  --  139  K  --  3.8  CL  --  106  CO2  --  25  GLUCOSE  --  105*  BUN  --  16  CREATININE 1.40* 1.35*  CALCIUM  --  9.2   Liver Function Tests: Recent Labs  Lab 01/19/18 1229  AST 20  ALT  16  ALKPHOS 46  BILITOT 0.7  PROT 6.9  ALBUMIN 3.7   CBC: Recent Labs  Lab 01/19/18 0931  WBC 9.1  NEUTROABS 6.4  HGB 14.1  HCT 43.0  MCV 94.7  PLT 247   Cardiac Enzymes: Recent Labs  Lab 01/19/18 0931 01/19/18 1229 01/19/18 1851 01/20/18 0050  TROPONINI <0.03 <0.03 <0.03 <0.03    Signed:  Vassie Loll MD.  [facility] 01/20/2018, 10:31 AM

## 2018-01-20 NOTE — Progress Notes (Signed)
Removed IV-clean, dry, intact. Reviewed d/c paperwork with patient. Reviewed new medications and gave preprinted prescriptions. Stable patient walked out with his sister.

## 2018-02-23 DIAGNOSIS — N183 Chronic kidney disease, stage 3 (moderate): Secondary | ICD-10-CM | POA: Diagnosis not present

## 2018-02-23 DIAGNOSIS — M5416 Radiculopathy, lumbar region: Secondary | ICD-10-CM | POA: Diagnosis not present

## 2018-02-23 DIAGNOSIS — R5383 Other fatigue: Secondary | ICD-10-CM | POA: Diagnosis not present

## 2018-02-23 DIAGNOSIS — I10 Essential (primary) hypertension: Secondary | ICD-10-CM | POA: Diagnosis not present

## 2018-02-23 DIAGNOSIS — E559 Vitamin D deficiency, unspecified: Secondary | ICD-10-CM | POA: Diagnosis not present

## 2018-02-28 ENCOUNTER — Other Ambulatory Visit (HOSPITAL_COMMUNITY): Payer: Self-pay | Admitting: Nurse Practitioner

## 2018-02-28 DIAGNOSIS — N281 Cyst of kidney, acquired: Secondary | ICD-10-CM

## 2018-03-07 ENCOUNTER — Encounter: Payer: Self-pay | Admitting: Gastroenterology

## 2018-03-14 ENCOUNTER — Ambulatory Visit (HOSPITAL_COMMUNITY)
Admission: RE | Admit: 2018-03-14 | Discharge: 2018-03-14 | Disposition: A | Discharge status: Home / Self Care | Payer: Medicare Other | Source: Ambulatory Visit | Attending: Nurse Practitioner | Admitting: Nurse Practitioner

## 2018-03-14 DIAGNOSIS — N281 Cyst of kidney, acquired: Secondary | ICD-10-CM | POA: Insufficient documentation

## 2018-03-14 DIAGNOSIS — N183 Chronic kidney disease, stage 3 (moderate): Secondary | ICD-10-CM | POA: Insufficient documentation

## 2018-04-11 DIAGNOSIS — M48061 Spinal stenosis, lumbar region without neurogenic claudication: Secondary | ICD-10-CM | POA: Diagnosis not present

## 2018-04-11 DIAGNOSIS — M47816 Spondylosis without myelopathy or radiculopathy, lumbar region: Secondary | ICD-10-CM | POA: Diagnosis not present

## 2018-04-11 DIAGNOSIS — M545 Low back pain: Secondary | ICD-10-CM | POA: Diagnosis not present

## 2018-04-11 DIAGNOSIS — M549 Dorsalgia, unspecified: Secondary | ICD-10-CM | POA: Diagnosis not present

## 2018-04-11 DIAGNOSIS — M5416 Radiculopathy, lumbar region: Secondary | ICD-10-CM | POA: Diagnosis not present

## 2018-04-13 DIAGNOSIS — I10 Essential (primary) hypertension: Secondary | ICD-10-CM | POA: Diagnosis not present

## 2018-04-13 DIAGNOSIS — N183 Chronic kidney disease, stage 3 (moderate): Secondary | ICD-10-CM | POA: Diagnosis not present

## 2018-04-13 DIAGNOSIS — E785 Hyperlipidemia, unspecified: Secondary | ICD-10-CM | POA: Diagnosis not present

## 2018-05-24 NOTE — Progress Notes (Deleted)
Presents today to arrange colonoscopy. Will need Propofol.

## 2018-05-27 ENCOUNTER — Ambulatory Visit: Payer: Medicare Other | Admitting: Gastroenterology

## 2018-07-25 ENCOUNTER — Ambulatory Visit (INDEPENDENT_AMBULATORY_CARE_PROVIDER_SITE_OTHER): Payer: Self-pay | Admitting: Nurse Practitioner

## 2019-02-07 ENCOUNTER — Encounter (INDEPENDENT_AMBULATORY_CARE_PROVIDER_SITE_OTHER): Payer: Self-pay | Admitting: Internal Medicine

## 2019-02-07 ENCOUNTER — Other Ambulatory Visit: Payer: Self-pay

## 2019-02-07 ENCOUNTER — Ambulatory Visit (INDEPENDENT_AMBULATORY_CARE_PROVIDER_SITE_OTHER): Payer: Medicare Other | Admitting: Internal Medicine

## 2019-02-07 VITALS — BP 126/70 | HR 72 | Ht 65.0 in | Wt 154.4 lb

## 2019-02-07 DIAGNOSIS — Z8673 Personal history of transient ischemic attack (TIA), and cerebral infarction without residual deficits: Secondary | ICD-10-CM

## 2019-02-07 DIAGNOSIS — E782 Mixed hyperlipidemia: Secondary | ICD-10-CM

## 2019-02-07 DIAGNOSIS — Z1159 Encounter for screening for other viral diseases: Secondary | ICD-10-CM

## 2019-02-07 DIAGNOSIS — N1831 Chronic kidney disease, stage 3a: Secondary | ICD-10-CM | POA: Insufficient documentation

## 2019-02-07 DIAGNOSIS — N183 Chronic kidney disease, stage 3 unspecified: Secondary | ICD-10-CM

## 2019-02-07 DIAGNOSIS — N1832 Chronic kidney disease, stage 3b: Secondary | ICD-10-CM

## 2019-02-07 DIAGNOSIS — Z23 Encounter for immunization: Secondary | ICD-10-CM

## 2019-02-07 DIAGNOSIS — I10 Essential (primary) hypertension: Secondary | ICD-10-CM | POA: Diagnosis not present

## 2019-02-07 HISTORY — DX: Chronic kidney disease, stage 3 unspecified: N18.30

## 2019-02-07 NOTE — Progress Notes (Signed)
Metrics: Intervention Frequency ACO  Documented Smoking Status Yearly  Screened one or more times in 24 months  Cessation Counseling or  Active cessation medication Past 24 months  Past 24 months   Guideline developer: UpToDate (See UpToDate for funding source) Date Released: 2014       Wellness Office Visit  Subjective:  Patient ID: Robert Walls, male    DOB: 1955/11/17  Age: 63 y.o. MRN: [id number]  CC: This man comes in for follow-up of hypertension, chronic kidney disease, cerebrovascular disease and hyperlipidemia. HPI  He is doing reasonably well.  He has no complaints today. I had seen him in August of this year and referred him to nephrology but I think he missed the appointment.  His creatinine had been increasing. He is willing to go and see a nephrologist in [place]. He continues on antihypertensive therapy as before.  He denies any chest pain, dyspnea, palpitations or new limb weakness. Unfortunately continues to smoke cigarettes. He also continues with statin therapy for hyperlipidemia in the face of cerebrovascular disease.  He denies any myalgias. Past Medical History:  Diagnosis Date  . Anxiety   . Aortic atherosclerosis ([place])    Documented by TEE  . Arthritis   . Back pain   . CKD (chronic kidney disease) stage 3, GFR 30-59 ml/min 02/07/2019  . Concussion 1993  . Essential hypertension, benign   . History of stroke July 2015   Small acute to subacute infarcts - TEE negative for cardioembolic source  . Impaired hearing   . Seizures (HCC)    Dilantin  . Stroke ([place])   . Substance abuse ([place])       Family History  Problem Relation Age of Onset  . Anesthesia problems Neg Hx   . Hypotension Neg Hx   . Malignant hyperthermia Neg Hx   . Pseudochol deficiency Neg Hx     Social History   Social History Narrative   Divorced since 2004.Lives with older sister.On disability since 2015 due to CVAs.   Social History   Tobacco Use  . Smoking status:  Current Every Day Smoker    Packs/day: 1.00    Years: 8.00    Pack years: 8.00    Types: Cigarettes  . Smokeless tobacco: Never Used  Substance Use Topics  . Alcohol use: No    Comment: former- last etoh was June    Current Meds  Medication Sig  . amLODipine (NORVASC) 10 MG tablet Take 1 tablet (10 mg total) by mouth daily.  Marland Kitchen aspirin EC 81 MG EC tablet Take 1 tablet (81 mg total) by mouth daily.  . metoprolol tartrate (LOPRESSOR) 25 MG tablet Take 25 mg by mouth 2 (two) times daily.  . pravastatin (PRAVACHOL) 20 MG tablet Take 20 mg by mouth daily.      Objective:   Today's Vitals: BP 126/70   Pulse 72   Ht _0  (1.651 m)   Wt 154 lb 6.4 oz (70 kg)   BMI 25.69 kg/m  Vitals with BMI 02/07/2019 01/20/2018 01/19/2018  Height _1  - -  Weight 154 lbs 6 oz - -  BMI 62.37 - -  Systolic 628 315 176  Diastolic 70 160 77  Pulse 72 77 75  Some encounter information is confidential and restricted. Go to Review Flowsheets activity to see all data.     Physical Exam  He looks systemically well.  Blood pressure is well controlled today.  He is alert and orientated without any  new focal neurological signs.     Assessment   1. Essential hypertension   2. History of stroke   3. Mixed hyperlipidemia   4. Stage 3b chronic kidney disease   5. Encounter for hepatitis C screening test for low risk patient       Tests ordered Orders Placed This Encounter  Procedures  . CMP with eGFR([facility])  . Lipid Panel  . Hep C Antibody  . Ambulatory referral to Nephrology     Plan: 1. Blood work is ordered as above. 2. He will continue with antihypertensive therapy as before. 3. He will continue statin therapy as before and we will see what the numbers show. 4. In view of his chronic kidney disease, I will refer him to Dr. Theador Hawthorne, in [place] who has now taken over the nephrology practice. 5. I will see him in about 3 months for an annual physical exam and further  recommendations will depend on blood results.  He was given influenza vaccination today.   No orders of the defined types were placed in this encounter.   Doree Albee, MD

## 2019-02-08 LAB — COMPLETE METABOLIC PANEL WITH GFR
AG Ratio: 1.4 (calc) (ref 1.0–2.5)
ALT: 21 U/L (ref 9–46)
AST: 25 U/L (ref 10–35)
Albumin: 4.2 g/dL (ref 3.6–5.1)
Alkaline phosphatase (APISO): 70 U/L (ref 35–144)
BUN/Creatinine Ratio: 21 (calc) (ref 6–22)
BUN: 34 mg/dL — ABNORMAL HIGH (ref 7–25)
CO2: 26 mmol/L (ref 20–32)
Calcium: 9.3 mg/dL (ref 8.6–10.3)
Chloride: 104 mmol/L (ref 98–110)
Creat: 1.64 mg/dL — ABNORMAL HIGH (ref 0.70–1.25)
GFR, Est African American: 52 mL/min/{1.73_m2} — ABNORMAL LOW (ref >=60)
GFR, Est Non African American: 44 mL/min/{1.73_m2} — ABNORMAL LOW (ref >=60)
Globulin: 3 g/dL (calc) (ref 1.9–3.7)
Glucose, Bld: 109 mg/dL — ABNORMAL HIGH (ref 65–99)
Potassium: 4.2 mmol/L (ref 3.5–5.3)
Sodium: 141 mmol/L (ref 135–146)
Total Bilirubin: 0.3 mg/dL (ref 0.2–1.2)
Total Protein: 7.2 g/dL (ref 6.1–8.1)

## 2019-02-08 LAB — LIPID PANEL
Cholesterol: 134 mg/dL (ref <200)
HDL: 47 mg/dL (ref >=40)
LDL Cholesterol (Calc): 64 mg/dL (calc)
Non-HDL Cholesterol (Calc): 87 mg/dL (calc) (ref <130)
Total CHOL/HDL Ratio: 2.9 (calc) (ref <5.0)
Triglycerides: 152 mg/dL — ABNORMAL HIGH (ref <150)

## 2019-02-08 LAB — HEPATITIS C ANTIBODY
Hepatitis C Ab: NONREACTIVE
SIGNAL TO CUT-OFF: 0.03 (ref <1.00)

## 2019-02-09 ENCOUNTER — Encounter (INDEPENDENT_AMBULATORY_CARE_PROVIDER_SITE_OTHER): Payer: Self-pay | Admitting: Internal Medicine

## 2019-02-22 ENCOUNTER — Other Ambulatory Visit (INDEPENDENT_AMBULATORY_CARE_PROVIDER_SITE_OTHER): Payer: Self-pay

## 2019-02-22 DIAGNOSIS — N1832 Chronic kidney disease, stage 3b: Secondary | ICD-10-CM

## 2019-02-22 NOTE — Progress Notes (Signed)
Pt called asked to resend referral to a provider in [place] area. He refused previous referral in Early fall

## 2019-03-31 ENCOUNTER — Other Ambulatory Visit: Payer: Self-pay | Admitting: Nephrology

## 2019-03-31 ENCOUNTER — Other Ambulatory Visit (HOSPITAL_COMMUNITY): Payer: Self-pay | Admitting: Nephrology

## 2019-03-31 DIAGNOSIS — N1831 Chronic kidney disease, stage 3a: Secondary | ICD-10-CM

## 2019-04-10 ENCOUNTER — Ambulatory Visit (HOSPITAL_COMMUNITY)
Admission: RE | Admit: 2019-04-10 | Discharge: 2019-04-10 | Disposition: A | Discharge status: Home / Self Care | Payer: Medicare Other | Source: Ambulatory Visit | Attending: Nephrology | Admitting: Nephrology

## 2019-04-10 ENCOUNTER — Other Ambulatory Visit: Payer: Self-pay

## 2019-04-10 DIAGNOSIS — N1831 Chronic kidney disease, stage 3a: Secondary | ICD-10-CM | POA: Diagnosis not present

## 2019-04-24 ENCOUNTER — Other Ambulatory Visit (INDEPENDENT_AMBULATORY_CARE_PROVIDER_SITE_OTHER): Payer: Self-pay | Admitting: Nurse Practitioner

## 2019-05-10 ENCOUNTER — Encounter (INDEPENDENT_AMBULATORY_CARE_PROVIDER_SITE_OTHER): Payer: Medicare Other | Admitting: Internal Medicine

## 2019-05-31 ENCOUNTER — Emergency Department (HOSPITAL_COMMUNITY): Payer: Medicare Other

## 2019-05-31 ENCOUNTER — Ambulatory Visit (HOSPITAL_COMMUNITY)
Admission: EM | Admit: 2019-05-31 | Discharge: 2019-05-31 | Disposition: A | Discharge status: Home / Self Care | Payer: Medicare Other | Attending: Emergency Medicine | Admitting: Emergency Medicine

## 2019-05-31 ENCOUNTER — Encounter (HOSPITAL_COMMUNITY)
Admission: EM | Disposition: A | Discharge status: Home / Self Care | Payer: Self-pay | Source: Home / Self Care | Attending: Emergency Medicine

## 2019-05-31 ENCOUNTER — Emergency Department (HOSPITAL_COMMUNITY): Payer: Medicare Other | Admitting: Certified Registered Nurse Anesthetist

## 2019-05-31 ENCOUNTER — Encounter (HOSPITAL_COMMUNITY): Payer: Self-pay | Admitting: Emergency Medicine

## 2019-05-31 DIAGNOSIS — F17210 Nicotine dependence, cigarettes, uncomplicated: Secondary | ICD-10-CM | POA: Insufficient documentation

## 2019-05-31 DIAGNOSIS — S68620A Partial traumatic transphalangeal amputation of right index finger, initial encounter: Secondary | ICD-10-CM | POA: Diagnosis not present

## 2019-05-31 DIAGNOSIS — I129 Hypertensive chronic kidney disease with stage 1 through stage 4 chronic kidney disease, or unspecified chronic kidney disease: Secondary | ICD-10-CM | POA: Diagnosis not present

## 2019-05-31 DIAGNOSIS — W312XXA Contact with powered woodworking and forming machines, initial encounter: Secondary | ICD-10-CM | POA: Diagnosis not present

## 2019-05-31 DIAGNOSIS — Z79899 Other long term (current) drug therapy: Secondary | ICD-10-CM | POA: Insufficient documentation

## 2019-05-31 DIAGNOSIS — N183 Chronic kidney disease, stage 3 unspecified: Secondary | ICD-10-CM | POA: Diagnosis not present

## 2019-05-31 DIAGNOSIS — S62620A Displaced fracture of medial phalanx of right index finger, initial encounter for closed fracture: Secondary | ICD-10-CM | POA: Diagnosis present

## 2019-05-31 DIAGNOSIS — Z20822 Contact with and (suspected) exposure to covid-19: Secondary | ICD-10-CM | POA: Insufficient documentation

## 2019-05-31 DIAGNOSIS — S68120A Partial traumatic metacarpophalangeal amputation of right index finger, initial encounter: Secondary | ICD-10-CM

## 2019-05-31 DIAGNOSIS — Z7982 Long term (current) use of aspirin: Secondary | ICD-10-CM | POA: Diagnosis not present

## 2019-05-31 DIAGNOSIS — Z8673 Personal history of transient ischemic attack (TIA), and cerebral infarction without residual deficits: Secondary | ICD-10-CM | POA: Diagnosis not present

## 2019-05-31 HISTORY — PX: I & D EXTREMITY: SHX5045

## 2019-05-31 LAB — RESPIRATORY PANEL BY RT PCR (FLU A&B, COVID)
Influenza A by PCR: NEGATIVE
Influenza B by PCR: NEGATIVE
SARS Coronavirus 2 by RT PCR: NEGATIVE

## 2019-05-31 SURGERY — IRRIGATION AND DEBRIDEMENT EXTREMITY
Anesthesia: General | Site: Finger | Laterality: Right

## 2019-05-31 MED ORDER — CHLORHEXIDINE GLUCONATE 4 % EX LIQD
60.0000 mL | Freq: Once | CUTANEOUS | Status: DC
  Filled 2019-05-31: qty 60

## 2019-05-31 MED ORDER — MIDAZOLAM HCL 5 MG/5ML IJ SOLN
INTRAMUSCULAR | Status: DC | PRN
  Administered 2019-05-31: 2 mg via INTRAVENOUS

## 2019-05-31 MED ORDER — CEFAZOLIN SODIUM-DEXTROSE 2-4 GM/100ML-% IV SOLN
2.0000 g | INTRAVENOUS | Status: AC
  Administered 2019-05-31: 17:00:00 2 g via INTRAVENOUS

## 2019-05-31 MED ORDER — CELECOXIB 200 MG PO CAPS: 200.0000 mg | ORAL_CAPSULE | Freq: Once | ORAL | Status: DC

## 2019-05-31 MED ORDER — SODIUM CHLORIDE 0.9 % IR SOLN
Status: DC | PRN
  Administered 2019-05-31: 1000 mL

## 2019-05-31 MED ORDER — SUCCINYLCHOLINE CHLORIDE 200 MG/10ML IV SOSY
PREFILLED_SYRINGE | INTRAVENOUS | Status: DC | PRN
  Administered 2019-05-31: 140 mg via INTRAVENOUS

## 2019-05-31 MED ORDER — EPHEDRINE 5 MG/ML INJ
INTRAVENOUS | Status: AC
  Filled 2019-05-31: qty 10

## 2019-05-31 MED ORDER — LIDOCAINE 2% (20 MG/ML) 5 ML SYRINGE
INTRAMUSCULAR | Status: AC
  Filled 2019-05-31: qty 5

## 2019-05-31 MED ORDER — CELECOXIB 400 MG PO CAPS: 400.0000 mg | ORAL_CAPSULE | ORAL | Status: DC

## 2019-05-31 MED ORDER — ONDANSETRON HCL 4 MG/2ML IJ SOLN
INTRAMUSCULAR | Status: DC | PRN
  Administered 2019-05-31: 4 mg via INTRAVENOUS

## 2019-05-31 MED ORDER — POVIDONE-IODINE 10 % EX SWAB: 2.0000 "application " | Freq: Once | CUTANEOUS | Status: DC

## 2019-05-31 MED ORDER — LIDOCAINE 2% (20 MG/ML) 5 ML SYRINGE
INTRAMUSCULAR | Status: DC | PRN
  Administered 2019-05-31: 80 mg via INTRAVENOUS

## 2019-05-31 MED ORDER — BUPIVACAINE HCL (PF) 0.25 % IJ SOLN
INTRAMUSCULAR | Status: DC | PRN
  Administered 2019-05-31: 7 mL

## 2019-05-31 MED ORDER — FENTANYL CITRATE (PF) 100 MCG/2ML IJ SOLN: 25.0000 ug | INTRAMUSCULAR | Status: DC | PRN

## 2019-05-31 MED ORDER — CELECOXIB 400 MG PO CAPS
400.0000 mg | ORAL_CAPSULE | ORAL | Status: AC
  Administered 2019-05-31: 400 mg via ORAL
  Filled 2019-05-31: qty 1

## 2019-05-31 MED ORDER — PROMETHAZINE HCL 25 MG/ML IJ SOLN: 6.2500 mg | INTRAMUSCULAR | Status: DC | PRN

## 2019-05-31 MED ORDER — PROPOFOL 10 MG/ML IV BOLUS
INTRAVENOUS | Status: DC | PRN
  Administered 2019-05-31: 130 mg via INTRAVENOUS

## 2019-05-31 MED ORDER — LACTATED RINGERS IV SOLN: INTRAVENOUS | Status: DC

## 2019-05-31 MED ORDER — MORPHINE SULFATE (PF) 4 MG/ML IV SOLN
4.0000 mg | Freq: Once | INTRAVENOUS | Status: AC
  Administered 2019-05-31: 15:00:00 4 mg via INTRAVENOUS
  Filled 2019-05-31: qty 1

## 2019-05-31 MED ORDER — PHENYLEPHRINE 40 MCG/ML (10ML) SYRINGE FOR IV PUSH (FOR BLOOD PRESSURE SUPPORT)
PREFILLED_SYRINGE | INTRAVENOUS | Status: DC | PRN
  Administered 2019-05-31: 80 ug via INTRAVENOUS

## 2019-05-31 MED ORDER — FENTANYL CITRATE (PF) 250 MCG/5ML IJ SOLN
INTRAMUSCULAR | Status: DC | PRN
  Administered 2019-05-31: 50 ug via INTRAVENOUS
  Administered 2019-05-31: 100 ug via INTRAVENOUS

## 2019-05-31 MED ORDER — CELECOXIB 200 MG PO CAPS
ORAL_CAPSULE | ORAL | Status: AC
  Filled 2019-05-31: qty 2

## 2019-05-31 MED ORDER — ACETAMINOPHEN 500 MG PO TABS
ORAL_TABLET | ORAL | Status: AC
  Administered 2019-05-31: 17:00:00 1000 mg via ORAL
  Filled 2019-05-31: qty 2

## 2019-05-31 MED ORDER — EPHEDRINE SULFATE-NACL 50-0.9 MG/10ML-% IV SOSY
PREFILLED_SYRINGE | INTRAVENOUS | Status: DC | PRN
  Administered 2019-05-31: 7.5 mg via INTRAVENOUS

## 2019-05-31 MED ORDER — BUPIVACAINE HCL (PF) 0.25 % IJ SOLN
INTRAMUSCULAR | Status: AC
  Filled 2019-05-31: qty 30

## 2019-05-31 MED ORDER — FENTANYL CITRATE (PF) 250 MCG/5ML IJ SOLN
INTRAMUSCULAR | Status: AC
  Filled 2019-05-31: qty 5

## 2019-05-31 MED ORDER — CEFAZOLIN SODIUM-DEXTROSE 2-4 GM/100ML-% IV SOLN
INTRAVENOUS | Status: AC
  Filled 2019-05-31: qty 100

## 2019-05-31 MED ORDER — CEPHALEXIN 500 MG PO CAPS: 500.0000 mg | ORAL_CAPSULE | Freq: Four times a day (QID) | ORAL | 0 refills | Status: AC

## 2019-05-31 MED ORDER — ACETAMINOPHEN 500 MG PO TABS: 1000.0000 mg | ORAL_TABLET | ORAL | Status: DC

## 2019-05-31 MED ORDER — DEXAMETHASONE SODIUM PHOSPHATE 10 MG/ML IJ SOLN
INTRAMUSCULAR | Status: DC | PRN
  Administered 2019-05-31: 10 mg via INTRAVENOUS

## 2019-05-31 MED ORDER — PROPOFOL 10 MG/ML IV BOLUS
INTRAVENOUS | Status: AC
  Filled 2019-05-31: qty 20

## 2019-05-31 MED ORDER — ACETAMINOPHEN 500 MG PO TABS: 1000.0000 mg | ORAL_TABLET | Freq: Once | ORAL | Status: AC

## 2019-05-31 MED ORDER — CEFAZOLIN SODIUM-DEXTROSE 2-4 GM/100ML-% IV SOLN
2.0000 g | Freq: Once | INTRAVENOUS | Status: AC
  Administered 2019-05-31: 15:00:00 2 g via INTRAVENOUS
  Filled 2019-05-31: qty 100

## 2019-05-31 MED ORDER — MIDAZOLAM HCL 2 MG/2ML IJ SOLN
INTRAMUSCULAR | Status: AC
  Filled 2019-05-31: qty 2

## 2019-05-31 MED ORDER — OXYCODONE-ACETAMINOPHEN 10-325 MG PO TABS: 1.0000 | ORAL_TABLET | Freq: Four times a day (QID) | ORAL | 0 refills | Status: DC | PRN

## 2019-05-31 SURGICAL SUPPLY — 40 items
BNDG CMPR 9X4 STRL LF SNTH (GAUZE/BANDAGES/DRESSINGS) ×1
BNDG COHESIVE 1X5 TAN STRL LF (GAUZE/BANDAGES/DRESSINGS) ×2 IMPLANT
BNDG CONFORM 2 STRL LF (GAUZE/BANDAGES/DRESSINGS) ×2 IMPLANT
BNDG ESMARK 4X9 LF (GAUZE/BANDAGES/DRESSINGS) ×3 IMPLANT
CORD BIPOLAR FORCEPS 12FT (ELECTRODE) ×3 IMPLANT
COVER SURGICAL LIGHT HANDLE (MISCELLANEOUS) ×3 IMPLANT
COVER WAND RF STERILE (DRAPES) ×3 IMPLANT
CUFF TOURN SGL QUICK 18X4 (TOURNIQUET CUFF) ×3 IMPLANT
DRAPE SURG 17X23 STRL (DRAPES) ×3 IMPLANT
DRSG EMULSION OIL 3X3 NADH (GAUZE/BANDAGES/DRESSINGS) ×2 IMPLANT
ELECT REM PT RETURN 9FT ADLT (ELECTROSURGICAL) ×3
ELECTRODE REM PT RTRN 9FT ADLT (ELECTROSURGICAL) IMPLANT
GAUZE SPONGE 4X4 12PLY STRL (GAUZE/BANDAGES/DRESSINGS) ×3 IMPLANT
GLOVE BIOGEL PI IND STRL 8.5 (GLOVE) ×1 IMPLANT
GLOVE BIOGEL PI INDICATOR 8.5 (GLOVE) ×2
GLOVE SURG ORTHO 8.0 STRL STRW (GLOVE) ×3 IMPLANT
GOWN STRL REUS W/ TWL LRG LVL3 (GOWN DISPOSABLE) ×3 IMPLANT
GOWN STRL REUS W/ TWL XL LVL3 (GOWN DISPOSABLE) ×1 IMPLANT
GOWN STRL REUS W/TWL LRG LVL3 (GOWN DISPOSABLE) ×9
GOWN STRL REUS W/TWL XL LVL3 (GOWN DISPOSABLE) ×3
KIT BASIN OR (CUSTOM PROCEDURE TRAY) ×3 IMPLANT
KIT TURNOVER KIT B (KITS) ×3 IMPLANT
MANIFOLD NEPTUNE II (INSTRUMENTS) ×3 IMPLANT
NDL HYPO 25GX1X1/2 BEV (NEEDLE) IMPLANT
NEEDLE HYPO 25GX1X1/2 BEV (NEEDLE) ×3 IMPLANT
NS IRRIG 1000ML POUR BTL (IV SOLUTION) ×3 IMPLANT
PACK ORTHO EXTREMITY (CUSTOM PROCEDURE TRAY) ×3 IMPLANT
PAD ARMBOARD 7.5X6 YLW CONV (MISCELLANEOUS) ×6 IMPLANT
SOAP 2 % CHG 4 OZ (WOUND CARE) ×3 IMPLANT
SPONGE LAP 18X18 RF (DISPOSABLE) ×3 IMPLANT
SPONGE LAP 4X18 RFD (DISPOSABLE) ×3 IMPLANT
SUT PROLENE 4 0 PS 2 18 (SUTURE) ×6 IMPLANT
SYR CONTROL 10ML LL (SYRINGE) ×2 IMPLANT
TOWEL GREEN STERILE (TOWEL DISPOSABLE) ×3 IMPLANT
TOWEL GREEN STERILE FF (TOWEL DISPOSABLE) ×3 IMPLANT
TUBE CONNECTING 12'X1/4 (SUCTIONS) ×1
TUBE CONNECTING 12X1/4 (SUCTIONS) ×2 IMPLANT
UNDERPAD 30X30 (UNDERPADS AND DIAPERS) ×3 IMPLANT
WATER STERILE IRR 1000ML POUR (IV SOLUTION) ×3 IMPLANT
YANKAUER SUCT BULB TIP NO VENT (SUCTIONS) ×3 IMPLANT

## 2019-05-31 NOTE — Op Note (Signed)
PREOPERATIVE DIAGNOSIS: Right index finger tablesaw injury with open fractures to the distal phalanx and middle phalanges and near amputation through the PIP joint.  POSTOPERATIVE DIAGNOSIS: Same  ATTENDING SURGEON: Dr. Bradly Bienenstock who scrubbed and present for the entire procedure  ASSISTANT SURGEON: None  ANESTHESIA: General via endotracheal anesthesia  OPERATIVE PROCEDURE: #1: Open debridement of skin subcutaneous tissue and bone associated with open fracture right index finger distal phalanx #2: Open debridement of skin subcutaneous tissue and bone associated with open fracture right index finger middle phalanx #3: Revision amputation right index finger with local neurectomies and advancement flap closure  IMPLANTS: None  RADIOGRAPHIC INTERPRETATION: None  SURGICAL INDICATIONS: Patient is a right-hand-dominant gentleman who sustained the open injury to the index finger from a circular saw.  Patient presented to the ER with the open injury.  Patient was recommended to undergo the above procedure.  Risks of surgery include but not limited to bleeding infection damage nearby nerves arteries or tendons loss of motion of the wrist and digits incomplete relief of symptoms and need for further surgical invention.  SURGICAL TECHNIQUE: Patient was palpated find the preoperative holding area marked permanent marker made on the right index finger to indicate correct operative site.  Patient brought back operating placed supine on anesthesia table where the general anesthetic was administered.  Preoperative antibiotics were given prior to skin incision.  Well-padded tourniquet was then placed on the right brachium seal with the appropriate drape.  The right upper extremities then prepped and draped normal sterile fashion.  A timeout was called the correct site was identified procedure then begun.  Attention was then turned to the right index finger open debridement of the skin and subcutaneous tissue  was then carried out of the distal phalanx removing the remaining portion of the distal phalanx this was done with a rondure curettes and sharp knife.  Following this this was then extended to the middle phalanx.  Open debridement of the middle phalanx fracture was then carried out using the same sharp instrumentation with sharp scissors and knife..  The wound was then thoroughly irrigated.  The flexor digitorum profundus was then resected and allowed to retract proximally.  Preservation of the FDS insertion on the middle phalanx was done.  Revision amputation was then carried out through the level of middle phalanx distal to the FDS insertion.  Wound was irrigated.  After excisional debridement of the devitalized tissue revision amputation was then carried out bringing the skin flaps and a rotational flap closure was then done after the radial digital nerve and neurovascular bundle had been resected and allowed to be retracted proximally.  The advancement flap was then rotated and closed with Prolene sutures.  After advancement flap closure Adaptic dressing sterile compressive bandage then applied.  6 cc of quarter percent Marcaine infiltrated proximally.  Sterile compressive bandage then applied.  The patient tolerated the procedure well was then extubated taken recovery in good condition.  POSTOPERATIVE PLAN: Patient be discharged to home.  See him back in the office in 7 days for wound check sent him down to see our therapist for small protective splint.  I will see him back at the 3-week mark for suture removal x-rays at the first visit.  X-rays at each visit.

## 2019-05-31 NOTE — Progress Notes (Signed)
Pt was discharged into the care of his sister.  He was insistent about her taking him to pick up his vehicle.  It was discussed with his sister that he was not to drive or operate machinery for 24 hours.  She verbalized understanding.

## 2019-05-31 NOTE — Discharge Instructions (Signed)
KEEP BANDAGE CLEAN AND DRY CALL OFFICE FOR F/U APPT [phone] in 7 days with Lambert Mody PA-C KEEP HAND ELEVATED ABOVE HEART OK TO APPLY ICE TO OPERATIVE AREA CONTACT OFFICE IF ANY WORSENING PAIN OR CONCERNS.

## 2019-05-31 NOTE — Transfer of Care (Signed)
Immediate Anesthesia Transfer of Care Note  Patient: Robert Walls  Procedure(s) Performed: IRRIGATION AND DEBRIDEMENT EXTREMITY Revision of Right Index Finger Amputation (Right Finger)  Patient Location: PACU  Anesthesia Type:General  Level of Consciousness: awake, alert , oriented and patient cooperative  Airway & Oxygen Therapy: Patient Spontanous Breathing and Patient connected to nasal cannula oxygen  Post-op Assessment: Report given to RN and Post -op Vital signs reviewed and stable  Post vital signs: Reviewed and stable  Last Vitals:  Vitals Value Taken Time  BP 146/80 05/31/19 1805  Temp 36.4 C 05/31/19 1805  Pulse 75 05/31/19 1808  Resp 13 05/31/19 1808  SpO2 100 % 05/31/19 1808  Vitals shown include unvalidated device data.  Last Pain:  Vitals:   05/31/19 1805  TempSrc:   PainSc: 0-No pain         Complications: No apparent anesthesia complications

## 2019-05-31 NOTE — Anesthesia Procedure Notes (Signed)
Procedure Name: Intubation Date/Time: 05/31/2019 5:19 PM Performed by: Shirlyn Goltz, CRNA Pre-anesthesia Checklist: Patient identified, Emergency Drugs available, Suction available and Patient being monitored Patient Re-evaluated:Patient Re-evaluated prior to induction Oxygen Delivery Method: Circle system utilized Preoxygenation: Pre-oxygenation with 100% oxygen Induction Type: IV induction Ventilation: Mask ventilation without difficulty Laryngoscope Size: Mac and 4 Grade View: Grade I Tube type: Oral Tube size: 7.0 mm Number of attempts: 1 Airway Equipment and Method: Stylet Placement Confirmation: ETT inserted through vocal cords under direct vision,  positive ETCO2 and breath sounds checked- equal and bilateral Secured at: 21 cm Tube secured with: Tape Dental Injury: Teeth and Oropharynx as per pre-operative assessment

## 2019-05-31 NOTE — Anesthesia Preprocedure Evaluation (Addendum)
Anesthesia Evaluation  Patient identified by MRN, date of birth, ID band Patient awake    Reviewed: Allergy & Precautions, H&P , NPO status , Patient's Chart, lab work & pertinent test results, reviewed documented beta blocker date and time   History of Anesthesia Complications Negative for: history of anesthetic complications  Airway Mallampati: II  TM Distance: >3 FB Neck ROM: Full    Dental  (+) Dental Advisory Given, Poor Dentition   Pulmonary Current Smoker and Patient abstained from smoking.,    Pulmonary exam normal        Cardiovascular hypertension, Pt. on medications and Pt. on home beta blockers Normal cardiovascular exam  Study Conclusions   - Left ventricle: The cavity size was normal. Wall thickness was  normal. Systolic function was normal. The estimated ejection  fraction was in the range of 60% to 65%. Wall motion was normal;  there were no regional wall motion abnormalities.  - Aortic valve: Valve area (VTI): 1.71 cm^2. Valve area (Vmax):  1.71 cm^2.  - Left atrium: The atrium was mildly dilated.  - Right atrium: The atrium was mildly dilated.  - Atrial septum: No defect or patent foramen ovale was identified.  - Technically adequate study.    Neuro/Psych PSYCHIATRIC DISORDERS Anxiety Depression CVA, Residual Symptoms    GI/Hepatic negative GI ROS, (+)     substance abuse  cocaine use and marijuana use, None for 10 years   Endo/Other  negative endocrine ROS  Renal/GU Renal InsufficiencyRenal diseasenegative Renal ROS  negative genitourinary   Musculoskeletal  (+) Arthritis , Osteoarthritis,  narcotic dependent  Abdominal   Peds  Hematology negative hematology ROS (+)   Anesthesia Other Findings   Reproductive/Obstetrics negative OB ROS                            Anesthesia Physical Anesthesia Plan  ASA: II and emergent  Anesthesia Plan: General    Post-op Pain Management:    Induction: Intravenous, Rapid sequence and Cricoid pressure planned  PONV Risk Score and Plan: 2 and Ondansetron, Dexamethasone and Midazolam  Airway Management Planned: Oral ETT  Additional Equipment:   Intra-op Plan:   Post-operative Plan: Extubation in OR  Informed Consent: I have reviewed the patients History and Physical, chart, labs and discussed the procedure including the risks, benefits and alternatives for the proposed anesthesia with the patient or authorized representative who has indicated his/her understanding and acceptance.     Dental advisory given  Plan Discussed with: CRNA  Anesthesia Plan Comments:        Anesthesia Quick Evaluation

## 2019-05-31 NOTE — ED Notes (Signed)
Cleaned patient right thumb with some saline and did a wet dressing with 4 by 4's and wrapped it up patient is resting with call bell in reach

## 2019-05-31 NOTE — ED Notes (Signed)
Pt received 10mg  of morphine by ems

## 2019-05-31 NOTE — ED Triage Notes (Signed)
Pt here from home with  A lac to the pointer finger on the right hand from a chop saw , tip of finger is off , bleeding is controlled

## 2019-05-31 NOTE — ED Provider Notes (Signed)
[facility] EMERGENCY DEPARTMENT Provider Note   CSN: [id number] Arrival date & time: 05/31/19  1327     History No chief complaint on file.   Robert Walls is a 64 y.o. male.  The history is provided by the patient. No language interpreter was used.     64 year old male with history of polysubstance abuse, prior stroke, seizures, presenting to the ER via EMS for evaluation of finger injury.  Patient report he was helping a friend trimming baseboard with a miter saw when he accidentally cut his right index finger.  Incident happened prior to arrival.  He report 10 out of 10 throbbing pain initially, improved after he received pain medication via EMS.  He is right-hand dominant.  Finger was wrapped by EMS.  He is up-to-date with tetanus.  Past Medical History:  Diagnosis Date  . Anxiety   . Aortic atherosclerosis ([place])    Documented by TEE  . Arthritis   . Back pain   . CKD (chronic kidney disease) stage 3, GFR 30-59 ml/min 02/07/2019  . Concussion 1993  . Essential hypertension, benign   . History of stroke July 2015   Small acute to subacute infarcts - TEE negative for cardioembolic source  . Impaired hearing   . Seizures (HCC)    Dilantin  . Stroke ([place])   . Substance abuse [facility])     Patient Active Problem List   Diagnosis Date Noted  . CKD (chronic kidney disease) stage 3, GFR 30-59 ml/min 02/07/2019  . Hyperlipidemia   . Tobacco abuse   . Other chronic pain   . Multilevel degenerative disc disease   . Atypical chest pain 01/19/2018  . Opioid use disorder, severe, dependence ([place]) 12/08/2016  . Cocaine use disorder, severe, dependence ([place]) 12/08/2016  . 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 Polysubstance abuse ([place]) 12/04/2016  . MDD (major depressive disorder), single episode, severe with psychosis ([place]) 12/04/2016  . Confusion 02/18/2014  . History of stroke 10/10/2013   . Seizure ([place]) 10/09/2013  . Essential hypertension 10/09/2013  . Rotator cuff tear arthropathy of right shoulder 06/16/2011    Past Surgical History:  Procedure Laterality Date  . BACK SURGERY  1993  . FINGER SURGERY  2003   Left ring finger  . INNER EAR SURGERY     Childhood  . TEE WITHOUT CARDIOVERSION N/A 10/13/2013   Procedure: TRANSESOPHAGEAL ECHOCARDIOGRAM (TEE) WITH PROPOFOL;  Surgeon: Satira Sark, MD;  Location: [facility];  Service: Cardiovascular;  Laterality: N/A;       Family History  Problem Relation Age of Onset  . Anesthesia problems Neg Hx   . Hypotension Neg Hx   . Malignant hyperthermia Neg Hx   . Pseudochol deficiency Neg Hx     Social History   Tobacco Use  . Smoking status: Current Every Day Smoker    Packs/day: 1.00    Years: 8.00    Pack years: 8.00    Types: Cigarettes  . Smokeless tobacco: Never Used  Substance Use Topics  . Alcohol use: No    Comment: former- last etoh was June  . Drug use: Yes    Types: Marijuana, Cocaine, Heroin    Comment: heroin, crack,    Home Medications Prior to Admission medications   Medication Sig Start Date End Date Taking? Authorizing Provider  amLODipine (NORVASC) 10 MG tablet TAKE 1 TABLET BY MOUTH EVERY DAY 04/24/19 07/23/19  Gosrani, Nimish C,  MD  aspirin EC 81 MG EC tablet Take 1 tablet (81 mg total) by mouth daily. 01/21/18   Vassie Loll, MD  metoprolol tartrate (LOPRESSOR) 25 MG tablet Take 25 mg by mouth 2 (two) times daily. 12/27/17   [provider]  pravastatin (PRAVACHOL) 20 MG tablet Take 20 mg by mouth daily.    [provider]  amLODipine (NORVASC) 10 MG tablet Take 1 tablet (10 mg total) by mouth daily. 12/10/16   Oneta Rack, NP    Allergies    Hctz [hydrochlorothiazide] and Lisinopril  Review of Systems   Review of Systems  Constitutional: Negative for fever.  Skin: Positive for wound.  Neurological: Negative for numbness.    Physical Exam Updated Vital  Signs BP 136/75 (BP Location: Left Arm)   Pulse 78   Temp 97.7 F (36.5 C) (Oral)   Resp 18   SpO2 98%   Physical Exam Vitals and nursing note reviewed.  Constitutional:      General: He is not in acute distress.    Appearance: He is well-developed.  HENT:     Head: Atraumatic.  Eyes:     Conjunctiva/sclera: Conjunctivae normal.  Musculoskeletal:        General: Signs of injury (Right index finger: Partial amputation involving the medial aspect of the finger extending in an oblique fashion.  Please refer to pictures below.) present.     Cervical back: Neck supple.  Skin:    Findings: No rash.  Neurological:     Mental Status: He is alert.           ED Results / Procedures / Treatments   Labs (all labs ordered are listed, but only abnormal results are displayed) Labs Reviewed  RESPIRATORY PANEL BY RT PCR (FLU A&B, COVID)    EKG None  Radiology DG Finger Index Right  Result Date: 05/31/2019 CLINICAL DATA:  Partial amputation. Circular saw injury. EXAM: RIGHT INDEX FINGER 2+V COMPARISON:  None. FINDINGS: Absent skin and soft tissues about the distal aspect of the digit from the middle phalanx to the tuft consistent with amputation. Suspect minimal osseous amputation of the distal phalanx about the radial aspect. Skin irregularity extends proximally to the proximal interphalangeal joint. No fracture of the proximal digit. IMPRESSION: Soft tissue amputation of the distal aspect of the digit from the middle phalanx to the tuft. Suspect minimal osseous amputation of the distal phalanx about the proximal aspect. Electronically Signed   By: Narda Rutherford M.D.   On: 05/31/2019 14:19    Procedures .Nerve Block  Date/Time: 05/31/2019 3:05 PM Performed by: Fayrene Helper, PA-C Authorized by: Fayrene Helper, PA-C   Consent:    Consent obtained:  Verbal   Consent given by:  Patient   Risks discussed:  Unsuccessful block and pain   Alternatives discussed:  No  treatment Indications:    Indications:  Procedural anesthesia Location:    Body area:  Upper extremity   Upper extremity nerve blocked: R index finger.   Laterality:  Right Pre-procedure details:    Skin preparation:  2% chlorhexidine   Preparation: Patient was prepped and draped in usual sterile fashion   Skin anesthesia (see MAR for exact dosages):    Skin anesthesia method:  Local infiltration   Local anesthetic:  Lidocaine 1% w/o epi Procedure details (see MAR for exact dosages):    Block needle gauge:  25 G   Anesthetic injected:  Lidocaine 1% w/o epi   Injection procedure:  Introduced needle  Paresthesia:  Immediately resolved Post-procedure details:    Dressing:  None   Outcome:  Pain relieved   Patient tolerance of procedure:  Tolerated well, no immediate complications   (including critical care time)  Medications Ordered in ED Medications  ceFAZolin (ANCEF) IVPB 2g/100 mL premix (2 g Intravenous New Bag/Given 05/31/19 1435)  chlorhexidine (HIBICLENS) 4 % liquid 4 application (has no administration in time range)  povidone-iodine 10 % swab 2 application (has no administration in time range)  morphine 4 MG/ML injection 4 mg (4 mg Intravenous Given 05/31/19 1435)    ED Course  I have reviewed the triage vital signs and the nursing notes.  Pertinent labs & imaging results that were available during my care of the patient were reviewed by me and considered in my medical decision making (see chart for details).    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  X-ray demonstrates soft tissue amputation of the distal aspects of the digit from the middle phalanx to the tuft with suspect minimal osseous irritation of the distal phalanx about the proximal aspect.  He is up-to-date with tetanus.  Digital block performed for pain control.  Additional pain medication given.  IV antibiotic including Ancef for presurgical prophylaxis.  Covid testing ordered.  I discussed care with orthopedic PA who agrees patient would need surgical management in the OR.   Fayrene Helper, PA-C 05/31/19 1507    Eber Hong, MD 06/01/19 2252

## 2019-05-31 NOTE — Anesthesia Postprocedure Evaluation (Signed)
Anesthesia Post Note  Patient: Robert Walls  Procedure(s) Performed: IRRIGATION AND DEBRIDEMENT EXTREMITY Revision of Right Index Finger Amputation (Right Finger)     Patient location during evaluation: PACU Anesthesia Type: General Level of consciousness: sedated Pain management: pain level controlled Vital Signs Assessment: post-procedure vital signs reviewed and stable Respiratory status: spontaneous breathing and respiratory function stable Cardiovascular status: stable Postop Assessment: no apparent nausea or vomiting Anesthetic complications: no    Last Vitals:  Vitals:   05/31/19 1820 05/31/19 1835  BP: 137/76 (!) 151/92  Pulse: 71 76  Resp: 12 14  Temp:  36.8 C  SpO2: 100% 100%    Last Pain:  Vitals:   05/31/19 1805  TempSrc:   PainSc: 0-No pain                 Joffrey Kerce DANIEL

## 2019-05-31 NOTE — ED Notes (Signed)
Report given to [facility] in Short Stay. IV relocated to L posterior forearm per OR request, pt's belongings in bags with pt sticker on them. Pt transported to SS bay 27 via wheelchair

## 2019-05-31 NOTE — Consult Note (Addendum)
Reason for Consult:Right index finger amputation Referring Physician: B Kam Rahimi is an 64 y.o. male.  HPI: Robert Walls was helping a friend and got his finger caught in a chop saw and partially amputated. He came to the ED and hand surgery was consulted. He is RHD and on disability.  Past Medical History:  Diagnosis Date  . Anxiety   . Aortic atherosclerosis ([place])    Documented by TEE  . Arthritis   . Back pain   . CKD (chronic kidney disease) stage 3, GFR 30-59 ml/min 02/07/2019  . Concussion 1993  . Essential hypertension, benign   . History of stroke July 2015   Small acute to subacute infarcts - TEE negative for cardioembolic source  . Impaired hearing   . Seizures (HCC)    Dilantin  . Stroke ([place])   . Substance abuse [facility])     Past Surgical History:  Procedure Laterality Date  . BACK SURGERY  1993  . FINGER SURGERY  2003   Left ring finger  . INNER EAR SURGERY     Childhood  . TEE WITHOUT CARDIOVERSION N/A 10/13/2013   Procedure: TRANSESOPHAGEAL ECHOCARDIOGRAM (TEE) WITH PROPOFOL;  Surgeon: Satira Sark, MD;  Location: [facility];  Service: Cardiovascular;  Laterality: N/A;    Family History  Problem Relation Age of Onset  . Anesthesia problems Neg Hx   . Hypotension Neg Hx   . Malignant hyperthermia Neg Hx   . Pseudochol deficiency Neg Hx     Social History:  reports that he has been smoking cigarettes. He has a 8.00 pack-year smoking history. He has never used smokeless tobacco. He reports current drug use. Drugs: Marijuana, Cocaine, and Heroin. He reports that he does not drink alcohol.  Allergies:  Allergies  Allergen Reactions  . Hctz [Hydrochlorothiazide] Other (See Comments)    Depleted sodium, requiring hospitalization (lisinopril-HCTZ combo)  . Lisinopril Nausea And Vomiting    Medications: I have reviewed the patient's current medications.  No results found for this or any previous visit (from the past 48 hour(s)).  DG Finger Index  Right  Result Date: 05/31/2019 CLINICAL DATA:  Partial amputation. Circular saw injury. EXAM: RIGHT INDEX FINGER 2+V COMPARISON:  None. FINDINGS: Absent skin and soft tissues about the distal aspect of the digit from the middle phalanx to the tuft consistent with amputation. Suspect minimal osseous amputation of the distal phalanx about the radial aspect. Skin irregularity extends proximally to the proximal interphalangeal joint. No fracture of the proximal digit. IMPRESSION: Soft tissue amputation of the distal aspect of the digit from the middle phalanx to the tuft. Suspect minimal osseous amputation of the distal phalanx about the proximal aspect. Electronically Signed   By: Keith Rake M.D.   On: 05/31/2019 14:19    Review of Systems  HENT: Negative for ear discharge, ear pain, hearing loss and tinnitus.   Eyes: Negative for photophobia and pain.  Respiratory: Negative for cough and shortness of breath.   Cardiovascular: Negative for chest pain.  Gastrointestinal: Negative for abdominal pain, nausea and vomiting.  Genitourinary: Negative for dysuria, flank pain, frequency and urgency.  Musculoskeletal: Positive for arthralgias (Right index finger). Negative for back pain, myalgias and neck pain.  Neurological: Negative for dizziness and headaches.  Hematological: Does not bruise/bleed easily.  Psychiatric/Behavioral: The patient is not nervous/anxious.    Blood pressure 136/75, pulse 78, temperature 97.7 F (36.5 C), temperature source Oral, resp. rate 18, SpO2 98 %. Physical Exam  Constitutional: He  appears well-developed and well-nourished. No distress.  HENT:  Head: Normocephalic and atraumatic.  Eyes: Conjunctivae are normal. Right eye exhibits no discharge. Left eye exhibits no discharge. No scleral icterus.  Cardiovascular: Normal rate and regular rhythm.  Respiratory: Effort normal. No respiratory distress.  Musculoskeletal:     Cervical back: Normal range of motion.      Comments: UEx shoulder, elbow, wrist, digits- Traumatic amputation longitudinally index finger P2/3 with loss of radial side, no instability, no blocks to motion  Sens  Ax/R/M/U intact  Mot   Ax/ R/ PIN/ M/ AIN/ U intact  Rad 2+  Neurological: He is alert.  Skin: Skin is warm and dry. He is not diaphoretic.  Psychiatric: He has a normal mood and affect. His behavior is normal.    Assessment/Plan: Right index finger amputation -- Will go to OR for Dr. Melvyn Novas for revision amputation. Anticipate discharge after surgery. HTN  PT SEEN/EXAMINED PT WITH OPEN FRACTURES AND AMPUTATION WITH SIGNIFICANT INJURY  PLAN FOR OPEN DEBRIDEMENT AND REVISION AMPUTATION  R/B/A DISCUSSED WITH PT IN THE HOSPITAL.  PT VOICED UNDERSTANDING OF PLAN CONSENT SIGNED DAY OF SURGERY PT SEEN AND EXAMINED PRIOR TO OPERATIVE PROCEDURE/DAY OF SURGERY SITE MARKED. QUESTIONS ANSWERED WILL GO HOME FOLLOWING SURGERY  WE ARE PLANNING SURGERY FOR YOUR UPPER EXTREMITY. THE RISKS AND BENEFITS OF SURGERY INCLUDE BUT NOT LIMITED TO BLEEDING INFECTION, DAMAGE TO NEARBY NERVES ARTERIES TENDONS, FAILURE OF SURGERY TO ACCOMPLISH ITS INTENDED GOALS, PERSISTENT SYMPTOMS AND NEED FOR FURTHER SURGICAL INTERVENTION. WITH THIS IN MIND WE WILL PROCEED. I HAVE DISCUSSED WITH THE PATIENT THE PRE AND POSTOPERATIVE REGIMEN AND THE DOS AND DON'TS. PT VOICED UNDERSTANDING AND INFORMED CONSENT SIGNED.   Rosario Duey Melvyn Novas MD    Freeman Caldron, PA-C Orthopedic Surgery [phone] 05/31/2019, 2:22 PM

## 2019-06-05 ENCOUNTER — Other Ambulatory Visit: Payer: Self-pay

## 2019-06-05 ENCOUNTER — Ambulatory Visit (INDEPENDENT_AMBULATORY_CARE_PROVIDER_SITE_OTHER): Payer: Medicare Other | Admitting: Nurse Practitioner

## 2019-06-05 ENCOUNTER — Encounter (INDEPENDENT_AMBULATORY_CARE_PROVIDER_SITE_OTHER): Payer: Self-pay | Admitting: Nurse Practitioner

## 2019-06-05 VITALS — BP 140/80 | HR 85 | Temp 98.2°F | Ht 66.0 in | Wt 153.6 lb

## 2019-06-05 DIAGNOSIS — Z131 Encounter for screening for diabetes mellitus: Secondary | ICD-10-CM

## 2019-06-05 DIAGNOSIS — E782 Mixed hyperlipidemia: Secondary | ICD-10-CM

## 2019-06-05 DIAGNOSIS — I10 Essential (primary) hypertension: Secondary | ICD-10-CM

## 2019-06-05 DIAGNOSIS — Z23 Encounter for immunization: Secondary | ICD-10-CM | POA: Diagnosis not present

## 2019-06-05 DIAGNOSIS — S68119A Complete traumatic metacarpophalangeal amputation of unspecified finger, initial encounter: Secondary | ICD-10-CM | POA: Diagnosis not present

## 2019-06-05 DIAGNOSIS — Z1211 Encounter for screening for malignant neoplasm of colon: Secondary | ICD-10-CM | POA: Diagnosis not present

## 2019-06-05 DIAGNOSIS — Z139 Encounter for screening, unspecified: Secondary | ICD-10-CM | POA: Diagnosis not present

## 2019-06-05 DIAGNOSIS — R351 Nocturia: Secondary | ICD-10-CM

## 2019-06-05 DIAGNOSIS — Z0001 Encounter for general adult medical examination with abnormal findings: Secondary | ICD-10-CM | POA: Diagnosis not present

## 2019-06-05 DIAGNOSIS — R5383 Other fatigue: Secondary | ICD-10-CM

## 2019-06-05 DIAGNOSIS — N1832 Chronic kidney disease, stage 3b: Secondary | ICD-10-CM

## 2019-06-05 DIAGNOSIS — Z125 Encounter for screening for malignant neoplasm of prostate: Secondary | ICD-10-CM

## 2019-06-05 MED ORDER — METOPROLOL TARTRATE 25 MG PO TABS: 25.0000 mg | ORAL_TABLET | Freq: Two times a day (BID) | ORAL | 0 refills | Status: DC

## 2019-06-05 MED ORDER — TAMSULOSIN HCL 0.4 MG PO CAPS: 0.4000 mg | ORAL_CAPSULE | Freq: Every day | ORAL | 0 refills | Status: DC

## 2019-06-05 NOTE — Progress Notes (Signed)
Subjective:  Patient ID: Robert Walls, male    DOB: [birth year]  Age: 64 y.o. MRN: [id number]  CC:  Chief Complaint  Patient presents with  . Annual Exam      HPI  This patient arrives today for his annual physical exam.  He is not sure when his last tetanus shot was administered.  He does think it was within the last 10 years, but is not sure this.  He did experience an accidental traumatic amputation of his second digit to the right hand approximately 5 days ago.  He has not had the shingles vaccine or COVID-19 vaccine.  He is not interested in either of these at this time.  He is up-to-date with other vaccines.  He is due for colon cancer screening.  He would also be due for prostate cancer screening.  Back in December 2019 PSA level was 0.5.  He continues to follow-up with his nephrologist for his chronic kidney disease.   Past Medical History:  Diagnosis Date  . Anxiety   . Aortic atherosclerosis ([place])    Documented by TEE  . Arthritis   . Back pain   . CKD (chronic kidney disease) stage 3, GFR 30-59 ml/min 02/07/2019  . Concussion 1993  . Essential hypertension, benign   . History of stroke July 2015   Small acute to subacute infarcts - TEE negative for cardioembolic source  . Impaired hearing   . Seizures (HCC)    Dilantin  . Stroke ([place])   . Substance abuse ([place])       Family History  Problem Relation Age of Onset  . Anesthesia problems Neg Hx   . Hypotension Neg Hx   . Malignant hyperthermia Neg Hx   . Pseudochol deficiency Neg Hx     Social History   Social History Narrative   Divorced since 2004.Lives with older sister.On disability since 2015 due to CVAs.   Social History   Tobacco Use  . Smoking status: Current Every Day Smoker    Packs/day: 1.00    Years: 8.00    Pack years: 8.00    Types: Cigarettes  . Smokeless tobacco: Never Used  Substance Use Topics  . Alcohol use: No    Comment: former- last etoh was June     Current Meds   Medication Sig  . amLODipine (NORVASC) 10 MG tablet TAKE 1 TABLET BY MOUTH EVERY DAY (Patient taking differently: Take 10 mg by mouth daily. )  . aspirin EC 81 MG EC tablet Take 1 tablet (81 mg total) by mouth daily.  . cephALEXin (KEFLEX) 500 MG capsule Take 1 capsule (500 mg total) by mouth 4 (four) times daily for 10 days.  . metoprolol tartrate (LOPRESSOR) 25 MG tablet Take 1 tablet (25 mg total) by mouth 2 (two) times daily.  . pravastatin (PRAVACHOL) 20 MG tablet Take 20 mg by mouth at bedtime.   . [DISCONTINUED] metoprolol tartrate (LOPRESSOR) 25 MG tablet Take 25 mg by mouth 2 (two) times daily.    ROS:  Review of Systems  Constitutional: Negative.   HENT: Negative.   Eyes: Negative.  Blurred vision: requires reading glasses.  Respiratory: Negative.   Cardiovascular: Negative.   Genitourinary: Positive for frequency. Negative for urgency (especially at night; reports waking up every 30 minutes to urinate; reports strong stream and feels he empties bladder fully with voiding).  Neurological: Negative.   Psychiatric/Behavioral: Negative.      Objective:   Today's Vitals: BP 140/80 (  BP Location: Left Arm, Patient Position: Sitting, Cuff Size: Normal)   Pulse (!) 145   Temp 98.2 F (36.8 C) (Temporal)   Ht 5' 6"  (1.676 m)   Wt 153 lb 9.6 oz (69.7 kg)   SpO2 92%   BMI 24.79 kg/m  Vitals with BMI 06/05/2019 05/31/2019 05/31/2019  Height 5' 6"  - -  Weight 153 lbs 10 oz - -  BMI 36.1 - -  Systolic 443 154 008  Diastolic 80 92 76  Pulse 676 76 71  Some encounter information is confidential and restricted. Go to Review Flowsheets activity to see all data.       Office Visit from 06/05/2019 in [facility]  PHQ-2 Total Score  0       Physical Exam Vitals reviewed.  Constitutional:      General: He is not in acute distress.    Appearance: Normal appearance. He is not ill-appearing.  HENT:     Head: Normocephalic and atraumatic.     Right Ear: Tympanic  membrane, ear canal and external ear normal.     Left Ear: Tympanic membrane, ear canal and external ear normal.  Eyes:     General: No scleral icterus.    Extraocular Movements: Extraocular movements intact.     Conjunctiva/sclera: Conjunctivae normal.     Pupils: Pupils are equal, round, and reactive to light.  Neck:     Vascular: No carotid bruit.  Cardiovascular:     Rate and Rhythm: Normal rate and regular rhythm.     Pulses: Normal pulses.     Heart sounds: Normal heart sounds.  Pulmonary:     Effort: Pulmonary effort is normal.     Breath sounds: Normal breath sounds.  Abdominal:     General: Bowel sounds are normal. There is no distension.     Palpations: There is no mass.     Tenderness: There is no abdominal tenderness.     Hernia: No hernia is present.  Musculoskeletal:        General: No swelling or tenderness.     Cervical back: Normal range of motion and neck supple. No rigidity.  Lymphadenopathy:     Cervical: No cervical adenopathy.  Skin:    General: Skin is warm and dry.  Neurological:     General: No focal deficit present.     Mental Status: He is alert and oriented to person, place, and time.     Cranial Nerves: No cranial nerve deficit.     Sensory: No sensory deficit.     Motor: No weakness.     Gait: Gait normal.  Psychiatric:        Mood and Affect: Mood normal.        Behavior: Behavior normal.        Judgment: Judgment normal.          Assessment   1. Traumatic amputation of finger, initial encounter   2. Screen for colon cancer   3. Screening for condition   4. Screening for diabetes mellitus   5. Prostate cancer screening   6. Fatigue, unspecified type   7. Essential hypertension   8. Mixed hyperlipidemia   9. Stage 3b chronic kidney disease   10. Nocturia   11. Encounter for general adult medical examination with abnormal findings      Tests ordered Orders Placed This Encounter  Procedures  . Tdap vaccine greater than or  equal to 7yo IM  . CMP with eGFR([facility])  .  CBC  . Hemoglobin A1c  . TSH  . Vitamin D, 25-hydroxy  . PSA  . Lipid Panel  . Ambulatory referral to Gastroenterology     Plan: 1.  I will have tetanus shot administered today.  He was encouraged to follow-up with his orthopedist later this week.  2.,  3.,  4.,  5.,  6.,  11.  We will collect screening blood work for further evaluation.  We will also refer him to gastroenterology to discuss options regarding colon cancer screening.  7.,  8.,  9.,  10.  He will continue with his current medications as prescribed.  He was encouraged to follow-up with his nephrologist as scheduled.  We will trial him on tamsulosin to see if this improves his nocturia.  He does have hydrochlorothiazide listed as allergy on his chart.  With further questioning, he tells me he experienced vomiting, but did not experience any anaphylactic type of allergy to hydrochlorothiazide in the past.  Meds ordered this encounter  Medications  . metoprolol tartrate (LOPRESSOR) 25 MG tablet    Sig: Take 1 tablet (25 mg total) by mouth 2 (two) times daily.    Dispense:  180 tablet    Refill:  0    Order Specific Question:   Supervising Provider    Answer:   Hurshel Party C [[id number]]  . tamsulosin (FLOMAX) 0.4 MG CAPS capsule    Sig: Take 1 capsule (0.4 mg total) by mouth daily.    Dispense:  90 capsule    Refill:  0    Per patient he tells me his allergy to HCTZ was nausea/vomiting not anaphylaxis.    Order Specific Question:   Supervising Provider    Answer:   Doree Albee [[id number]]    Patient to follow-up in 6 weeks.  Ailene Ards, NP

## 2019-06-05 NOTE — Patient Instructions (Signed)
Thank you for choosing [facility] as your medical provider! If you have any questions or concerns regarding your health care, please do not hesitate to call our office.  Continue your medication as prescribed.  I have sent a new medication called tamsulosin which will hopefully help to reduce your nighttime awakenings to urinate.  Take tamsulosin, 1 tablet by mouth daily.  Follow-up as scheduled in 6 weeks to see how you are tolerating this medication.  Make sure to call the orthopedist's number to be seen for follow-up after your amputation.  Also I am sending referral to gastroenterologist for further discussion regarding colon cancer screening.  I will discuss your blood work with you either by telephone or in person when I see you next.  Please follow-up as scheduled in 6 weeks. We look forward to seeing you again soon!   At [facility] we value your feedback. You may receive a survey about your visit today. Please share your experience as we strive to create trusting relationships with our patients to provide genuine, compassionate, quality care.  We appreciate your understanding and patience as we review any laboratory studies, imaging, and other diagnostic tests that are ordered as we care for you. We do our best to address any and all results in a timely manner. If you do not hear about test results within 1 week, please do not hesitate to contact us. If we referred you to a specialist during your visit or ordered imaging testing, contact the office if you have not been contacted to be scheduled within 1 weeks.  We also encourage the use of MyChart, which contains your medical information for your review as well. If you are not enrolled in this feature, an access code is on this after visit summary for your convenience. Thank you for allowing [place] to be involved in your care.

## 2019-06-06 ENCOUNTER — Encounter: Payer: Self-pay | Admitting: Internal Medicine

## 2019-06-06 ENCOUNTER — Encounter (INDEPENDENT_AMBULATORY_CARE_PROVIDER_SITE_OTHER): Payer: Self-pay | Admitting: Nurse Practitioner

## 2019-06-06 LAB — COMPLETE METABOLIC PANEL WITH GFR
AG Ratio: 1.3 (calc) (ref 1.0–2.5)
ALT: 20 U/L (ref 9–46)
AST: 25 U/L (ref 10–35)
Albumin: 4.5 g/dL (ref 3.6–5.1)
Alkaline phosphatase (APISO): 78 U/L (ref 35–144)
BUN/Creatinine Ratio: 18 (calc) (ref 6–22)
BUN: 27 mg/dL — ABNORMAL HIGH (ref 7–25)
CO2: 29 mmol/L (ref 20–32)
Calcium: 9.8 mg/dL (ref 8.6–10.3)
Chloride: 100 mmol/L (ref 98–110)
Creat: 1.52 mg/dL — ABNORMAL HIGH (ref 0.70–1.25)
GFR, Est African American: 56 mL/min/{1.73_m2} — ABNORMAL LOW (ref >=60)
GFR, Est Non African American: 49 mL/min/{1.73_m2} — ABNORMAL LOW (ref >=60)
Globulin: 3.5 g/dL (calc) (ref 1.9–3.7)
Glucose, Bld: 86 mg/dL (ref 65–99)
Potassium: 5.3 mmol/L (ref 3.5–5.3)
Sodium: 135 mmol/L (ref 135–146)
Total Bilirubin: 0.5 mg/dL (ref 0.2–1.2)
Total Protein: 8 g/dL (ref 6.1–8.1)

## 2019-06-06 LAB — LIPID PANEL
Cholesterol: 162 mg/dL (ref <200)
HDL: 56 mg/dL (ref >=40)
LDL Cholesterol (Calc): 83 mg/dL (calc)
Non-HDL Cholesterol (Calc): 106 mg/dL (calc) (ref <130)
Total CHOL/HDL Ratio: 2.9 (calc) (ref <5.0)
Triglycerides: 130 mg/dL (ref <150)

## 2019-06-06 LAB — VITAMIN D 25 HYDROXY (VIT D DEFICIENCY, FRACTURES): Vit D, 25-Hydroxy: 32 ng/mL (ref 30–100)

## 2019-06-06 LAB — CBC
HCT: 45.5 % (ref 38.5–50.0)
Hemoglobin: 14.6 g/dL (ref 13.2–17.1)
MCH: 30.2 pg (ref 27.0–33.0)
MCHC: 32.1 g/dL (ref 32.0–36.0)
MCV: 94.2 fL (ref 80.0–100.0)
MPV: 10.7 fL (ref 7.5–12.5)
Platelets: 292 10*3/uL (ref 140–400)
RBC: 4.83 10*6/uL (ref 4.20–5.80)
RDW: 12.3 % (ref 11.0–15.0)
WBC: 10.1 10*3/uL (ref 3.8–10.8)

## 2019-06-06 LAB — HEMOGLOBIN A1C
Hgb A1c MFr Bld: 5.8 % of total Hgb — ABNORMAL HIGH (ref <5.7)
Mean Plasma Glucose: 120 (calc)
eAG (mmol/L): 6.6 (calc)

## 2019-06-06 LAB — PSA: PSA: 0.3 ng/mL (ref <=4.0)

## 2019-06-06 LAB — TSH: TSH: 2.42 mIU/L (ref 0.40–4.50)

## 2019-06-08 ENCOUNTER — Other Ambulatory Visit (INDEPENDENT_AMBULATORY_CARE_PROVIDER_SITE_OTHER): Payer: Self-pay | Admitting: Internal Medicine

## 2019-06-08 ENCOUNTER — Other Ambulatory Visit (INDEPENDENT_AMBULATORY_CARE_PROVIDER_SITE_OTHER): Payer: Self-pay

## 2019-06-08 DIAGNOSIS — I10 Essential (primary) hypertension: Secondary | ICD-10-CM

## 2019-06-08 DIAGNOSIS — R351 Nocturia: Secondary | ICD-10-CM

## 2019-06-08 MED ORDER — ASPIRIN 81 MG PO TBEC: 81.0000 mg | DELAYED_RELEASE_TABLET | Freq: Every day | ORAL | 1 refills | Status: DC

## 2019-06-08 MED ORDER — METOPROLOL TARTRATE 25 MG PO TABS: 25.0000 mg | ORAL_TABLET | Freq: Two times a day (BID) | ORAL | 0 refills | Status: DC

## 2019-06-08 MED ORDER — TAMSULOSIN HCL 0.4 MG PO CAPS: 0.4000 mg | ORAL_CAPSULE | Freq: Every day | ORAL | 0 refills | Status: DC

## 2019-06-08 MED ORDER — AMLODIPINE BESYLATE 10 MG PO TABS: 10.0000 mg | ORAL_TABLET | Freq: Every day | ORAL | 0 refills | Status: DC

## 2019-06-08 MED ORDER — ASPIRIN 81 MG PO TBEC: 81.0000 mg | DELAYED_RELEASE_TABLET | Freq: Every day | ORAL | 3 refills | Status: DC

## 2019-06-08 MED ORDER — PRAVASTATIN SODIUM 20 MG PO TABS: 20.0000 mg | ORAL_TABLET | Freq: Every day | ORAL | 0 refills | Status: DC

## 2019-07-20 ENCOUNTER — Ambulatory Visit: Payer: Medicare Other | Admitting: Nurse Practitioner

## 2019-07-20 NOTE — Progress Notes (Deleted)
Primary Care Physician:  Doree Albee, MD Primary Gastroenterologist:  Dr.   Rayne Du chief complaint on file.   HPI:   Robert Walls is a 64 y.o. male who presents on referral from primary care for first ever colonoscopy.  Nurse/phone triage was deferred to office visit due to history of polysubstance abuse likely necessitating MAC sedation.  Reviewed information provided with referral including physical exam with primary care 06/05/2019.  Apparently the patient experienced a traumatic amputation of his second digit to the right hand 5 days prior.  Due for colon cancer screening.  Reviewed labs completed today including CMP with stable creatinine 1.52 and normal LFTs.  CBC unremarkable.  Hemoglobin A1c 5.8 (generally well controlled).  TSH, vitamin D, PSA, lipid panel all normal.  No history of colonoscopy in our system.  Today he states   Past Medical History:  Diagnosis Date  . Anxiety   . Aortic atherosclerosis ([place])    Documented by TEE  . Arthritis   . Back pain   . CKD (chronic kidney disease) stage 3, GFR 30-59 ml/min 02/07/2019  . Concussion 1993  . Essential hypertension, benign   . History of stroke July 2015   Small acute to subacute infarcts - TEE negative for cardioembolic source  . Impaired hearing   . Seizures (HCC)    Dilantin  . Stroke ([place])   . Substance abuse [facility])     Past Surgical History:  Procedure Laterality Date  . BACK SURGERY  1993  . FINGER SURGERY  2003   Left ring finger  . I & D EXTREMITY Right 05/31/2019   Procedure: IRRIGATION AND DEBRIDEMENT EXTREMITY Revision of Right Index Finger Amputation;  Surgeon: Iran Planas, MD;  Location: [place];  Service: Orthopedics;  Laterality: Right;  . INNER EAR SURGERY     Childhood  . TEE WITHOUT CARDIOVERSION N/A 10/13/2013   Procedure: TRANSESOPHAGEAL ECHOCARDIOGRAM (TEE) WITH PROPOFOL;  Surgeon: Satira Sark, MD;  Location: [facility];  Service: Cardiovascular;  Laterality: N/A;    Current  Outpatient Medications  Medication Sig Dispense Refill  . amLODipine (NORVASC) 10 MG tablet Take 1 tablet (10 mg total) by mouth daily. 90 tablet 0  . aspirin 81 MG EC tablet Take 1 tablet (81 mg total) by mouth daily. 30 tablet 3  . metoprolol tartrate (LOPRESSOR) 25 MG tablet Take 1 tablet (25 mg total) by mouth 2 (two) times daily. 180 tablet 0  . pravastatin (PRAVACHOL) 20 MG tablet Take 1 tablet (20 mg total) by mouth at bedtime. 90 tablet 0  . tamsulosin (FLOMAX) 0.4 MG CAPS capsule Take 1 capsule (0.4 mg total) by mouth daily. 90 capsule 0   No current facility-administered medications for this visit.    Allergies as of 07/20/2019 - Review Complete 06/05/2019  Allergen Reaction Noted  . Hctz [hydrochlorothiazide] Other (See Comments) 01/04/2015  . Lisinopril Nausea And Vomiting 06/08/2011    Family History  Problem Relation Age of Onset  . Anesthesia problems Neg Hx   . Hypotension Neg Hx   . Malignant hyperthermia Neg Hx   . Pseudochol deficiency Neg Hx     Social History   Socioeconomic History  . Marital status: Divorced    Spouse name: Not on file  . Number of children: Not on file  . Years of education: Not on file  . Highest education level: Not on file  Occupational History  . Occupation: disabled  Tobacco Use  . Smoking status: Current Every  Day Smoker    Packs/day: 1.00    Years: 8.00    Pack years: 8.00    Types: Cigarettes  . Smokeless tobacco: Never Used  Substance and Sexual Activity  . Alcohol use: No    Comment: former- last etoh was June  . Drug use: Yes    Types: Marijuana, Cocaine, Heroin    Comment: heroin, crack,  . Sexual activity: Never  Other Topics Concern  . Not on file  Social History Narrative   Divorced since 2004.Lives with older sister.On disability since 2015 due to CVAs.   Social Determinants of Health   Financial Resource Strain:   . Difficulty of Paying Living Expenses:   Food Insecurity:   . Worried About Ship broker in the Last Year:   . Arboriculturist in the Last Year:   Transportation Needs:   . Film/video editor (Medical):   Marland Kitchen Lack of Transportation (Non-Medical):   Physical Activity:   . Days of Exercise per Week:   . Minutes of Exercise per Session:   Stress:   . Feeling of Stress :   Social Connections:   . Frequency of Communication with Friends and Family:   . Frequency of Social Gatherings with Friends and Family:   . Attends Religious Services:   . Active Member of Clubs or Organizations:   . Attends Archivist Meetings:   Marland Kitchen Marital Status:   Intimate Partner Violence:   . Fear of Current or Ex-Partner:   . Emotionally Abused:   Marland Kitchen Physically Abused:   . Sexually Abused:     Subjective: Review of Systems  Constitutional: Negative for chills, fever, malaise/fatigue and weight loss.  HENT: Negative for congestion and sore throat.   Respiratory: Negative for cough and shortness of breath.   Cardiovascular: Negative for chest pain and palpitations.  Gastrointestinal: Negative for abdominal pain, blood in stool, diarrhea, melena, nausea and vomiting.  Musculoskeletal: Negative for joint pain and myalgias.  Skin: Negative for rash.  Neurological: Negative for dizziness and weakness.  Endo/Heme/Allergies: Does not bruise/bleed easily.  Psychiatric/Behavioral: Negative for depression. The patient is not nervous/anxious.   All other systems reviewed and are negative.      Objective: There were no vitals taken for this visit. Physical Exam Vitals and nursing note reviewed.  Constitutional:      General: He is not in acute distress.    Appearance: Normal appearance. He is not ill-appearing, toxic-appearing or diaphoretic.  HENT:     Head: Normocephalic and atraumatic.     Nose: No congestion or rhinorrhea.  Eyes:     General: No scleral icterus. Cardiovascular:     Rate and Rhythm: Normal rate and regular rhythm.     Heart sounds: Normal heart sounds.    Pulmonary:     Effort: Pulmonary effort is normal.     Breath sounds: Normal breath sounds.  Abdominal:     General: Bowel sounds are normal. There is no distension.     Palpations: Abdomen is soft. There is no hepatomegaly, splenomegaly or mass.     Tenderness: There is no abdominal tenderness. There is no guarding or rebound.     Hernia: No hernia is present.  Musculoskeletal:     Cervical back: Neck supple.  Skin:    General: Skin is warm and dry.     Coloration: Skin is not jaundiced.     Findings: No bruising or rash.  Neurological:  General: No focal deficit present.     Mental Status: He is alert and oriented to person, place, and time. Mental status is at baseline.  Psychiatric:        Mood and Affect: Mood normal.        Behavior: Behavior normal.        Thought Content: Thought content normal.       07/20/2019 7:55 AM   Disclaimer: This note was dictated with voice recognition software. Similar sounding words can inadvertently be transcribed and may not be corrected upon review.

## 2019-07-25 ENCOUNTER — Encounter (INDEPENDENT_AMBULATORY_CARE_PROVIDER_SITE_OTHER): Payer: Self-pay | Admitting: Nurse Practitioner

## 2019-07-25 ENCOUNTER — Ambulatory Visit (INDEPENDENT_AMBULATORY_CARE_PROVIDER_SITE_OTHER): Payer: Medicare Other | Admitting: Nurse Practitioner

## 2019-07-25 ENCOUNTER — Other Ambulatory Visit: Payer: Self-pay

## 2019-07-25 VITALS — BP 90/60 | HR 71 | Temp 98.6°F | Ht 66.0 in | Wt 156.0 lb

## 2019-07-25 DIAGNOSIS — Z139 Encounter for screening, unspecified: Secondary | ICD-10-CM

## 2019-07-25 DIAGNOSIS — I10 Essential (primary) hypertension: Secondary | ICD-10-CM | POA: Diagnosis not present

## 2019-07-25 DIAGNOSIS — R5383 Other fatigue: Secondary | ICD-10-CM | POA: Diagnosis not present

## 2019-07-25 DIAGNOSIS — R351 Nocturia: Secondary | ICD-10-CM | POA: Diagnosis not present

## 2019-07-25 NOTE — Progress Notes (Signed)
Subjective:  Patient ID: Robert Walls, male    DOB: 23-Dec-1955  Age: 64 y.o. MRN: [id number]  CC:  Chief Complaint  Patient presents with  . Hypertension  . Follow-up  . Other    BPH, screenings, fatigue      HPI  This patient comes in today for follow-up of the above.  Hypertension: He continues on amlodipine 10 mg daily and metoprolol 25 mg twice daily.  He tells me is tolerating these medications well.  BPH: He also was trialed on tamsulosin to see if this would help with his nocturia.  Last PSA was collected in March 2021 and it was 0.3.  Today, the patient tells me that his nocturia has resolved and he is no longer waking up at night to urinate.  Screenings: At his last office visit I had referred him to gastroenterology for colon cancer screening.  He tells me he has not gone to this appointment, but does remember getting a voicemail from them saying he missed an appointment.  Fatigue: He does complain today of fatigue.  He tells me this has been ongoing for a while.  He tells me he has felt down intermittently and seems to have lack of motivation or desire to do much.  Per chart review I see that last time blood work was collected there was no evidence of anemia, TSH was normal, vitamin D level was normal, metabolic panel stable.  He denies any unintentional weight loss, or joint pain/stiffness/swelling.   Past Medical History:  Diagnosis Date  . Anxiety   . Aortic atherosclerosis (HCC)    Documented by TEE  . Arthritis   . Back pain   . CKD (chronic kidney disease) stage 3, GFR 30-59 ml/min 02/07/2019  . Concussion 1993  . Essential hypertension, benign   . History of stroke July 2015   Small acute to subacute infarcts - TEE negative for cardioembolic source  . Impaired hearing   . Seizures (HCC)    Dilantin  . Stroke (HCC)   . Substance abuse (HCC)       Family History  Problem Relation Age of Onset  . Anesthesia problems Neg Hx   . Hypotension Neg  Hx   . Malignant hyperthermia Neg Hx   . Pseudochol deficiency Neg Hx     Social History   Social History Narrative   Divorced since 2004.Lives with older sister.On disability since 2015 due to CVAs.   Social History   Tobacco Use  . Smoking status: Current Every Day Smoker    Packs/day: 1.00    Years: 8.00    Pack years: 8.00    Types: Cigarettes  . Smokeless tobacco: Never Used  Substance Use Topics  . Alcohol use: No    Comment: former- last etoh was June     Current Meds  Medication Sig  . amLODipine (NORVASC) 10 MG tablet Take 1 tablet (10 mg total) by mouth daily.  Marland Kitchen aspirin 81 MG EC tablet Take 1 tablet (81 mg total) by mouth daily.  . cephALEXin (KEFLEX) 500 MG capsule Take 500 mg by mouth every 6 (six) hours.  . metoprolol tartrate (LOPRESSOR) 25 MG tablet Take 1 tablet (25 mg total) by mouth 2 (two) times daily.  Marland Kitchen oxyCODONE-acetaminophen (PERCOCET) 10-325 MG tablet Take 1 tablet by mouth every 6 (six) hours.  . pravastatin (PRAVACHOL) 20 MG tablet Take 1 tablet (20 mg total) by mouth at bedtime.  . tamsulosin (FLOMAX) 0.4 MG CAPS  capsule Take 1 capsule (0.4 mg total) by mouth daily.    ROS:  Review of Systems  Constitutional: Negative for fever, malaise/fatigue and weight loss.  Eyes: Negative for blurred vision and double vision.  Respiratory: Negative for cough, shortness of breath and wheezing.   Cardiovascular: Negative for chest pain and palpitations.  Neurological: Negative for dizziness and headaches.     Objective:   Today's Vitals: BP 90/60 (BP Location: Left Arm, Patient Position: Sitting, Cuff Size: Normal)   Pulse 71   Temp 98.6 F (37 C) (Temporal)   Ht 5\' 6"  (1.676 m)   Wt 156 lb (70.8 kg)   SpO2 95%   BMI 25.18 kg/m  Vitals with BMI 07/25/2019 06/05/2019 06/05/2019  Height 5\' 6"  - 5\' 6"   Weight 156 lbs - 153 lbs 10 oz  BMI 25.19 - 24.8  Systolic 90 - 140  Diastolic 60 - 80  Pulse 71 85 06/07/2019  Some encounter information is  confidential and restricted. Go to Review Flowsheets activity to see all data.     Physical Exam Vitals reviewed.  Constitutional:      Appearance: Normal appearance.  HENT:     Head: Normocephalic and atraumatic.  Cardiovascular:     Rate and Rhythm: Normal rate and regular rhythm.  Pulmonary:     Effort: Pulmonary effort is normal.     Breath sounds: Normal breath sounds.  Musculoskeletal:     Cervical back: Neck supple.  Skin:    General: Skin is warm and dry.  Neurological:     Mental Status: He is alert and oriented to person, place, and time.  Psychiatric:        Mood and Affect: Mood normal.        Behavior: Behavior normal.        Thought Content: Thought content normal.        Judgment: Judgment normal.          Assessment and Plan   1. Fatigue, unspecified type   2. Screening for condition   3. Essential hypertension   4. Nocturia      Plan: 1.  For now I have encouraged him to try and be physically active most days of the week.  I recommended he try to walk about twice a day for 5 days out of the week to see if this helps improve his energy and motivation.  I have also encouraged him to make sure he follows up with the gastroenterologist that he can undergo colon cancer screening.  I wonder if mild depression could be playing a role, thus, if his fatigue persists despite increasing his physical activity we may trial a mild antidepressant.  I have low suspicion for autoimmune disease or cancer at this time, however this does remain on my differential as well.  2.  As stated above I encouraged him to follow-up with gastroenterology regarding colon cancer screening.  I have given him the office his phone numbers that he can call them back to get his missed appointment rescheduled.  3.  Blood pressure is low today.  He is asymptomatic, he does tell me that his fatigue has been occurring since before starting tamsulosin.  For now I will keep him on the medications  that he is on as prescribed.  I did tell him that if he experiences any worsening fatigue, chest pain, difficulty breathing, vision changes that he should let me know.  We may need to consider reducing his amlodipine at future  visit.  4.  Much improved since starting tamsulosin.  He will continue taking his medication as prescribed.  Tests ordered No orders of the defined types were placed in this encounter.     No orders of the defined types were placed in this encounter.   Patient to follow-up in 2-3 months or sooner as needed.  Ailene Ards, NP

## 2019-08-01 ENCOUNTER — Other Ambulatory Visit (INDEPENDENT_AMBULATORY_CARE_PROVIDER_SITE_OTHER): Payer: Self-pay | Admitting: Internal Medicine

## 2019-08-10 ENCOUNTER — Telehealth (INDEPENDENT_AMBULATORY_CARE_PROVIDER_SITE_OTHER): Payer: Self-pay

## 2019-08-10 ENCOUNTER — Other Ambulatory Visit (INDEPENDENT_AMBULATORY_CARE_PROVIDER_SITE_OTHER): Payer: Self-pay | Admitting: Internal Medicine

## 2019-08-10 NOTE — Telephone Encounter (Signed)
Robert Walls is calling stating that he picked up his BP medication at the pharmacy yesterday and he only got a 30 day supply, he states he normally gets 90 day and he is asking why, please advise?

## 2019-08-10 NOTE — Telephone Encounter (Signed)
The pharmacy is stating that it was written for 90 they will correct the issue

## 2019-08-10 NOTE — Telephone Encounter (Signed)
I do not know why this is the case.  When I looked on prescriptions for amlodipine and metoprolol, both of these are 90-day supplies.  If you could call the pharmacy and clarify, that would be great.

## 2019-09-27 ENCOUNTER — Other Ambulatory Visit (INDEPENDENT_AMBULATORY_CARE_PROVIDER_SITE_OTHER): Payer: Self-pay | Admitting: Internal Medicine

## 2019-09-28 ENCOUNTER — Ambulatory Visit: Payer: Medicare Other | Admitting: Nurse Practitioner

## 2019-09-28 ENCOUNTER — Telehealth: Payer: Self-pay | Admitting: Nurse Practitioner

## 2019-09-28 NOTE — Telephone Encounter (Signed)
Patient will need a new referral prior to rescheduling

## 2019-09-28 NOTE — Progress Notes (Deleted)
Primary Care Physician:  Doree Albee, MD Primary Gastroenterologist:  Dr.   Rayne Du chief complaint on file.   HPI:   Robert Walls is a 64 y.o. male who presents to schedule colonoscopy.  Nurse/phone triage was deferred office visit due to polysubstance abuse history likely necessitating MAC sedation.  Reviewed primary care note dated 06/05/2019 which noted he is due for colon cancer screening.  The patient was subsequently referred to our office.  No history of colonoscopy in our system.  Today he states   Past Medical History:  Diagnosis Date  . Anxiety   . Aortic atherosclerosis ([place])    Documented by TEE  . Arthritis   . Back pain   . CKD (chronic kidney disease) stage 3, GFR 30-59 ml/min 02/07/2019  . Concussion 1993  . Essential hypertension, benign   . History of stroke July 2015   Small acute to subacute infarcts - TEE negative for cardioembolic source  . Impaired hearing   . Seizures (HCC)    Dilantin  . Stroke ([place])   . Substance abuse [facility])     Past Surgical History:  Procedure Laterality Date  . BACK SURGERY  1993  . FINGER SURGERY  2003   Left ring finger  . I & D EXTREMITY Right 05/31/2019   Procedure: IRRIGATION AND DEBRIDEMENT EXTREMITY Revision of Right Index Finger Amputation;  Surgeon: Iran Planas, MD;  Location: [facility];  Service: Orthopedics;  Laterality: Right;  . INNER EAR SURGERY     Childhood  . TEE WITHOUT CARDIOVERSION N/A 10/13/2013   Procedure: TRANSESOPHAGEAL ECHOCARDIOGRAM (TEE) WITH PROPOFOL;  Surgeon: Satira Sark, MD;  Location: [facility];  Service: Cardiovascular;  Laterality: N/A;    Current Outpatient Medications  Medication Sig Dispense Refill  . amLODipine (NORVASC) 10 MG tablet TAKE 1 TABLET BY MOUTH EVERY DAY 90 tablet 0  . aspirin 81 MG EC tablet Take 1 tablet (81 mg total) by mouth daily. 30 tablet 3  . cephALEXin (KEFLEX) 500 MG capsule Take 500 mg by mouth every 6 (six) hours.    . metoprolol tartrate (LOPRESSOR) 25  MG tablet Take 1 tablet (25 mg total) by mouth 2 (two) times daily. 180 tablet 0  . oxyCODONE-acetaminophen (PERCOCET) 10-325 MG tablet Take 1 tablet by mouth every 6 (six) hours.    . pravastatin (PRAVACHOL) 20 MG tablet TAKE 1 TABLET BY MOUTH AT BEDTIME 90 tablet 0  . tamsulosin (FLOMAX) 0.4 MG CAPS capsule Take 1 capsule (0.4 mg total) by mouth daily. 90 capsule 0   No current facility-administered medications for this visit.    Allergies as of 09/28/2019 - Review Complete 07/25/2019  Allergen Reaction Noted  . Hctz [hydrochlorothiazide] Other (See Comments) 01/04/2015  . Lisinopril Nausea And Vomiting 06/08/2011    Family History  Problem Relation Age of Onset  . Anesthesia problems Neg Hx   . Hypotension Neg Hx   . Malignant hyperthermia Neg Hx   . Pseudochol deficiency Neg Hx     Social History   Socioeconomic History  . Marital status: Divorced    Spouse name: Not on file  . Number of children: Not on file  . Years of education: Not on file  . Highest education level: Not on file  Occupational History  . Occupation: disabled  Tobacco Use  . Smoking status: Current Every Day Smoker    Packs/day: 1.00    Years: 8.00    Pack years: 8.00    Types: Cigarettes  .  Smokeless tobacco: Never Used  Substance and Sexual Activity  . Alcohol use: No    Comment: former- last etoh was June  . Drug use: Yes    Types: Marijuana, Cocaine, Heroin    Comment: heroin, crack,  . Sexual activity: Never  Other Topics Concern  . Not on file  Social History Narrative   Divorced since 2004.Lives with older sister.On disability since 2015 due to CVAs.   Social Determinants of Health   Financial Resource Strain:   . Difficulty of Paying Living Expenses:   Food Insecurity:   . Worried About Charity fundraiser in the Last Year:   . Arboriculturist in the Last Year:   Transportation Needs:   . Film/video editor (Medical):   Marland Kitchen Lack of Transportation (Non-Medical):   Physical  Activity:   . Days of Exercise per Week:   . Minutes of Exercise per Session:   Stress:   . Feeling of Stress :   Social Connections:   . Frequency of Communication with Friends and Family:   . Frequency of Social Gatherings with Friends and Family:   . Attends Religious Services:   . Active Member of Clubs or Organizations:   . Attends Archivist Meetings:   Marland Kitchen Marital Status:   Intimate Partner Violence:   . Fear of Current or Ex-Partner:   . Emotionally Abused:   Marland Kitchen Physically Abused:   . Sexually Abused:     Subjective: Review of Systems  Constitutional: Negative for chills, fever, malaise/fatigue and weight loss.  HENT: Negative for congestion and sore throat.   Respiratory: Negative for cough and shortness of breath.   Cardiovascular: Negative for chest pain and palpitations.  Gastrointestinal: Negative for abdominal pain, blood in stool, diarrhea, melena, nausea and vomiting.  Musculoskeletal: Negative for joint pain and myalgias.  Skin: Negative for rash.  Neurological: Negative for dizziness and weakness.  Endo/Heme/Allergies: Does not bruise/bleed easily.  Psychiatric/Behavioral: Negative for depression. The patient is not nervous/anxious.   All other systems reviewed and are negative.      Objective: There were no vitals taken for this visit. Physical Exam Vitals and nursing note reviewed.  Constitutional:      General: He is not in acute distress.    Appearance: Normal appearance. He is not ill-appearing, toxic-appearing or diaphoretic.  HENT:     Head: Normocephalic and atraumatic.     Nose: No congestion or rhinorrhea.  Eyes:     General: No scleral icterus. Cardiovascular:     Rate and Rhythm: Normal rate and regular rhythm.     Heart sounds: Normal heart sounds.  Pulmonary:     Effort: Pulmonary effort is normal.     Breath sounds: Normal breath sounds.  Abdominal:     General: Bowel sounds are normal. There is no distension.      Palpations: Abdomen is soft. There is no hepatomegaly, splenomegaly or mass.     Tenderness: There is no abdominal tenderness. There is no guarding or rebound.     Hernia: No hernia is present.  Musculoskeletal:     Cervical back: Neck supple.  Skin:    General: Skin is warm and dry.     Coloration: Skin is not jaundiced.     Findings: No bruising or rash.  Neurological:     General: No focal deficit present.     Mental Status: He is alert and oriented to person, place, and time. Mental status is at baseline.  Psychiatric:        Mood and Affect: Mood normal.        Behavior: Behavior normal.        Thought Content: Thought content normal.       09/28/2019 8:03 AM   Disclaimer: This note was dictated with voice recognition software. Similar sounding words can inadvertently be transcribed and may not be corrected upon review.

## 2019-09-28 NOTE — Telephone Encounter (Signed)
3RD NO SHOW FOR NEW PATIENT APPOINTMENT

## 2019-10-04 ENCOUNTER — Other Ambulatory Visit (INDEPENDENT_AMBULATORY_CARE_PROVIDER_SITE_OTHER): Payer: Self-pay

## 2019-10-04 MED ORDER — PRAVASTATIN SODIUM 20 MG PO TABS: 20.0000 mg | ORAL_TABLET | Freq: Every day | ORAL | 0 refills | Status: DC

## 2019-10-10 ENCOUNTER — Ambulatory Visit (INDEPENDENT_AMBULATORY_CARE_PROVIDER_SITE_OTHER): Payer: Medicare Other | Admitting: Nurse Practitioner

## 2019-10-10 ENCOUNTER — Encounter (INDEPENDENT_AMBULATORY_CARE_PROVIDER_SITE_OTHER): Payer: Self-pay | Admitting: *Deleted

## 2019-10-10 ENCOUNTER — Other Ambulatory Visit: Payer: Self-pay

## 2019-10-10 ENCOUNTER — Encounter (INDEPENDENT_AMBULATORY_CARE_PROVIDER_SITE_OTHER): Payer: Self-pay | Admitting: Nurse Practitioner

## 2019-10-10 VITALS — BP 120/60 | HR 69 | Temp 97.7°F | Ht 66.0 in | Wt 153.2 lb

## 2019-10-10 DIAGNOSIS — Z1211 Encounter for screening for malignant neoplasm of colon: Secondary | ICD-10-CM | POA: Diagnosis not present

## 2019-10-10 DIAGNOSIS — R351 Nocturia: Secondary | ICD-10-CM

## 2019-10-10 DIAGNOSIS — R7303 Prediabetes: Secondary | ICD-10-CM

## 2019-10-10 DIAGNOSIS — I10 Essential (primary) hypertension: Secondary | ICD-10-CM

## 2019-10-10 DIAGNOSIS — H9193 Unspecified hearing loss, bilateral: Secondary | ICD-10-CM | POA: Diagnosis not present

## 2019-10-10 DIAGNOSIS — E782 Mixed hyperlipidemia: Secondary | ICD-10-CM

## 2019-10-10 DIAGNOSIS — R5383 Other fatigue: Secondary | ICD-10-CM | POA: Diagnosis not present

## 2019-10-10 DIAGNOSIS — N401 Enlarged prostate with lower urinary tract symptoms: Secondary | ICD-10-CM

## 2019-10-10 NOTE — Progress Notes (Signed)
Subjective:  Patient ID: Robert Walls, male    DOB: 05/02/1955  Age: 64 y.o. MRN: [id number]  CC:  Chief Complaint  Patient presents with  . Hypertension  . hearing aides    wanting hearing aides  . Fatigue  . Hyperlipidemia  . Prediabetes  . Benign Prostatic Hypertrophy      HPI  This patient arrives today for the above.  Hypertension: He continues on his amlodipine and metoprolol to treat his hypertension.  Hearing loss: He tells me he does have some significant hearing loss and has undergone surgery in the past to repair damage to his tympanic membranes.  He tells me he has been evaluated by audiology in the past and they have recommended hearing aids, however he has never followed up with them regularly nor has he ever had hearing aids.  He would like to be referred today to hopefully get hearing aids at this time.  Fatigue: He does continue to have fatigue.  At last office visit we did discuss this on further detail on I recommended that he start walking twice a day approximately 5 days a week to see if this would help with his energy levels.  He tells me he does not do this on a regular basis.  Per chart review I do see that he does not have anemia, vitamin D level is in the low normal range, thyroid screen showed normal TSH level, he does have some chronic kidney disease last creatinine was 1.52 and eGFR was 49.  He denies snoring or excessive daytime sleepiness.  He denies any excessive fatigue or dyspnea with physical activity.  Hyperlipidemia: He continues on pravastatin and low-dose aspirin daily.  Last LDL was 83.  Prediabetes: Last A1c was 5.8.  He continues to try to avoid sugary foods and snacks.  BPH: He continues on tamsulosin.  Last PSA was 0.3.   Past Medical History:  Diagnosis Date  . Anxiety   . Aortic atherosclerosis ([place])    Documented by TEE  . Arthritis   . Back pain   . CKD (chronic kidney disease) stage 3, GFR 30-59 ml/min 02/07/2019  .  Concussion 1993  . Essential hypertension, benign   . History of stroke July 2015   Small acute to subacute infarcts - TEE negative for cardioembolic source  . Impaired hearing   . Seizures (HCC)    Dilantin  . Stroke ([place])   . Substance abuse ([place])       Family History  Problem Relation Age of Onset  . Anesthesia problems Neg Hx   . Hypotension Neg Hx   . Malignant hyperthermia Neg Hx   . Pseudochol deficiency Neg Hx     Social History   Social History Narrative   Divorced since 2004.Lives with older sister.On disability since 2015 due to CVAs.   Social History   Tobacco Use  . Smoking status: Current Every Day Smoker    Packs/day: 1.00    Years: 8.00    Pack years: 8.00    Types: Cigarettes  . Smokeless tobacco: Never Used  Substance Use Topics  . Alcohol use: No    Comment: former- last etoh was June     Current Meds  Medication Sig  . amLODipine (NORVASC) 10 MG tablet TAKE 1 TABLET BY MOUTH EVERY DAY  . aspirin 81 MG EC tablet Take 1 tablet (81 mg total) by mouth daily.  . metoprolol tartrate (LOPRESSOR) 25 MG tablet Take 1 tablet (  25 mg total) by mouth 2 (two) times daily.  . pravastatin (PRAVACHOL) 20 MG tablet Take 1 tablet (20 mg total) by mouth at bedtime.  . tamsulosin (FLOMAX) 0.4 MG CAPS capsule Take 1 capsule (0.4 mg total) by mouth daily.    ROS:  Review of Systems  Constitutional: Positive for malaise/fatigue. Negative for fever and weight loss.  HENT: Positive for hearing loss.   Eyes: Negative for blurred vision.  Respiratory: Negative for cough, shortness of breath and wheezing.   Cardiovascular: Negative for chest pain.  Gastrointestinal: Negative for abdominal pain and blood in stool.  Neurological: Negative for dizziness and headaches.  Psychiatric/Behavioral: Negative for suicidal ideas.     Objective:   Today's Vitals: BP 120/60 (BP Location: Left Arm, Patient Position: Sitting, Cuff Size: Normal)   Pulse 69   Temp 97.7 F  (36.5 C) (Temporal)   Ht 5' 6"  (1.676 m)   Wt 153 lb 3.2 oz (69.5 kg)   SpO2 98%   BMI 24.73 kg/m  Vitals with BMI 10/10/2019 07/25/2019 06/05/2019  Height 5' 6"  5' 6"  -  Weight 153 lbs 3 oz 156 lbs -  BMI 99.24 26.83 -  Systolic 419 90 -  Diastolic 60 60 -  Pulse 69 71 85  Some encounter information is confidential and restricted. Go to Review Flowsheets activity to see all data.     Physical Exam Vitals reviewed.  Constitutional:      Appearance: Normal appearance.  HENT:     Head: Normocephalic and atraumatic.     Ears:     Comments: Failed whisper test bilaterally Cardiovascular:     Rate and Rhythm: Normal rate and regular rhythm.  Pulmonary:     Effort: Pulmonary effort is normal.     Breath sounds: Normal breath sounds.  Musculoskeletal:     Cervical back: Neck supple.  Skin:    General: Skin is warm and dry.  Neurological:     Mental Status: He is alert and oriented to person, place, and time.  Psychiatric:        Mood and Affect: Mood normal.        Behavior: Behavior normal.        Thought Content: Thought content normal.        Judgment: Judgment normal.       PHQ9 SCORE ONLY 10/10/2019 06/05/2019  PHQ-9 Total Score 14 0      Assessment and Plan   1. Bilateral hearing loss, unspecified hearing loss type   2. Colon cancer screening   3. Fatigue, unspecified type   4. Essential hypertension   5. Mixed hyperlipidemia   6. Prediabetes   7. Benign prostatic hyperplasia with nocturia      Plan: 1.  I will refer him to audiology per his request.  2.-3.  He is overdue for colon cancer screening, and I will refer him to gastroenterology for further evaluation of this.  Etiology of his fatigue is still uncertain.  He did score moderately high on his PHQ-9.  I do wonder if his mood may be playing a role.  We did discuss counseling versus pharmacological treatment for depression to see if this would help with his fatigue, however right now he does not want  to pursue either treatment option.  We will continue to monitor this for now.  4.  He will continue on his current antihypertensives to treat his hypertension.  5.  Continue on his current medication regimen for his hyperlipidemia.  6.  He will continue to try to avoid sugary foods and processed carbohydrates.  7.  He will continue on his tamsulosin.   Tests ordered Orders Placed This Encounter  Procedures  . Ambulatory referral to Audiology  . Ambulatory referral to Gastroenterology      No orders of the defined types were placed in this encounter.   Patient to follow-up in 4 to 6 weeks for his annual wellness visit, and then in 3 months for office visit with Dr. Anastasio Champion.  May need to consider collecting blood work at that time.  Ailene Ards, NP

## 2019-10-16 ENCOUNTER — Other Ambulatory Visit (INDEPENDENT_AMBULATORY_CARE_PROVIDER_SITE_OTHER): Payer: Self-pay | Admitting: Internal Medicine

## 2019-10-16 DIAGNOSIS — I10 Essential (primary) hypertension: Secondary | ICD-10-CM

## 2019-10-26 ENCOUNTER — Other Ambulatory Visit (INDEPENDENT_AMBULATORY_CARE_PROVIDER_SITE_OTHER): Payer: Self-pay | Admitting: Internal Medicine

## 2019-11-17 ENCOUNTER — Other Ambulatory Visit (INDEPENDENT_AMBULATORY_CARE_PROVIDER_SITE_OTHER): Payer: Self-pay | Admitting: Internal Medicine

## 2019-11-17 DIAGNOSIS — R351 Nocturia: Secondary | ICD-10-CM

## 2019-11-21 ENCOUNTER — Ambulatory Visit (INDEPENDENT_AMBULATORY_CARE_PROVIDER_SITE_OTHER): Payer: Medicare Other | Admitting: Nurse Practitioner

## 2019-12-07 ENCOUNTER — Ambulatory Visit (INDEPENDENT_AMBULATORY_CARE_PROVIDER_SITE_OTHER): Payer: Medicare Other | Admitting: Nurse Practitioner

## 2019-12-20 ENCOUNTER — Encounter (INDEPENDENT_AMBULATORY_CARE_PROVIDER_SITE_OTHER): Payer: Medicare Other | Admitting: Nurse Practitioner

## 2019-12-21 ENCOUNTER — Encounter (INDEPENDENT_AMBULATORY_CARE_PROVIDER_SITE_OTHER): Payer: Self-pay | Admitting: Nurse Practitioner

## 2019-12-21 ENCOUNTER — Ambulatory Visit (INDEPENDENT_AMBULATORY_CARE_PROVIDER_SITE_OTHER): Payer: Medicare Other | Admitting: Nurse Practitioner

## 2019-12-21 ENCOUNTER — Other Ambulatory Visit: Payer: Self-pay

## 2019-12-21 VITALS — BP 90/50 | HR 67 | Temp 97.5°F | Resp 12 | Ht 65.0 in | Wt 148.2 lb

## 2019-12-21 DIAGNOSIS — E782 Mixed hyperlipidemia: Secondary | ICD-10-CM

## 2019-12-21 DIAGNOSIS — Z7189 Other specified counseling: Secondary | ICD-10-CM

## 2019-12-21 DIAGNOSIS — I10 Essential (primary) hypertension: Secondary | ICD-10-CM

## 2019-12-21 DIAGNOSIS — Z0000 Encounter for general adult medical examination without abnormal findings: Secondary | ICD-10-CM

## 2019-12-21 MED ORDER — AMLODIPINE BESYLATE 5 MG PO TABS: 5.0000 mg | ORAL_TABLET | Freq: Every day | ORAL | 1 refills | Status: DC

## 2019-12-21 MED ORDER — EZETIMIBE 10 MG PO TABS: 10.0000 mg | ORAL_TABLET | Freq: Every day | ORAL | 0 refills | Status: DC

## 2019-12-21 NOTE — Patient Instructions (Addendum)
Stop pravastatin (cholesterol medicine) and start ezetimibe (10mg  by mouth daily.)  Stop amlodipine 10 mg tablets and start amlodipine 5mg  tablet (take  1tablet by mouth daily). This is for blood pressure.    Robert Walls , Thank you for taking time to come for your Medicare Wellness Visit. I appreciate your ongoing commitment to your health goals. Please review the following plan we discussed and let me know if I can assist you in the future.   These are the goals we discussed: Goals    . Exercise 150 min/wk Moderate Activity       This is a list of the screening recommended for you and due dates:  Health Maintenance  Topic Date Due  . Colon Cancer Screening  Never done  . Flu Shot  10/22/2019  . COVID-19 Vaccine (1) 01/06/2020*  . Tetanus Vaccine  06/04/2029  .  Hepatitis C: One time screening is recommended by Center for Disease Control  (CDC) for  adults born from [birth year] through 1963.   Completed  . HIV Screening  Completed  *Topic was postponed. The date shown is not the original due date.

## 2019-12-21 NOTE — Progress Notes (Signed)
Subjective:   Robert Walls is a 64 y.o. male who presents for Medicare Annual/Subsequent preventive examination.  Review of Systems    Myalgias Cardiac Risk Factors include: dyslipidemia;male gender     Objective:    Today's Vitals   12/21/19 1459  BP: (!) 90/50  Pulse: 67  Resp: 12  Temp: (!) 97.5 F (36.4 C)  SpO2: 95%  Weight: 148 lb 3.2 oz (67.2 kg)  Height: 5\' 5"  (1.651 m)   Body mass index is 24.66 kg/m.  Advanced Directives 12/21/2019 05/31/2019 01/19/2018 01/19/2018 12/04/2016 02/18/2014 02/18/2014  Does Patient Have a Medical Advance Directive? No No No No (No Data) No No  Would patient like information on creating a medical advance directive? No - Patient declined No - Patient declined No - Patient declined - - - -  Pre-existing out of facility DNR order (yellow form or pink MOST form) - - - - - - -  Some encounter information is confidential and restricted. Go to Review Flowsheets activity to see all data.    Current Medications (verified) Outpatient Encounter Medications as of 12/21/2019  Medication Sig  . amLODipine (NORVASC) 5 MG tablet Take 1 tablet (5 mg total) by mouth daily.  12/23/2019 aspirin 81 MG EC tablet Take 1 tablet (81 mg total) by mouth daily.  Marland Kitchen ezetimibe (ZETIA) 10 MG tablet Take 1 tablet (10 mg total) by mouth daily.  . metoprolol tartrate (LOPRESSOR) 25 MG tablet TAKE 1 TABLET BY MOUTH TWICE DAILY  . tamsulosin (FLOMAX) 0.4 MG CAPS capsule TAKE 1 CAPSULE BY MOUTH DAILY  . [DISCONTINUED] amLODipine (NORVASC) 10 MG tablet Take 1 tablet (10 mg total) by mouth daily.  . [DISCONTINUED] amLODipine (NORVASC) 10 MG tablet Take 1 tablet (10 mg total) by mouth daily.  . [DISCONTINUED] amLODipine (NORVASC) 10 MG tablet TAKE 1 TABLET BY MOUTH DAILY  . [DISCONTINUED] pravastatin (PRAVACHOL) 20 MG tablet Take 1 tablet (20 mg total) by mouth at bedtime.   No facility-administered encounter medications on file as of 12/21/2019.    Allergies (verified) Hctz  [hydrochlorothiazide] and Lisinopril   History: Past Medical History:  Diagnosis Date  . Anxiety   . Aortic atherosclerosis (HCC)    Documented by TEE  . Arthritis   . Back pain   . CKD (chronic kidney disease) stage 3, GFR 30-59 ml/min 02/07/2019  . Concussion 1993  . Essential hypertension, benign   . History of stroke July 2015   Small acute to subacute infarcts - TEE negative for cardioembolic source  . Impaired hearing   . Seizures (HCC)    Dilantin  . Stroke (HCC)   . Substance abuse [facility])    Past Surgical History:  Procedure Laterality Date  . BACK SURGERY  1993  . FINGER SURGERY  2003   Left ring finger  . I & D EXTREMITY Right 05/31/2019   Procedure: IRRIGATION AND DEBRIDEMENT EXTREMITY Revision of Right Index Finger Amputation;  Surgeon: 07/31/2019, MD;  Location: [facility] OR;  Service: Orthopedics;  Laterality: Right;  . INNER EAR SURGERY     Childhood  . TEE WITHOUT CARDIOVERSION N/A 10/13/2013   Procedure: TRANSESOPHAGEAL ECHOCARDIOGRAM (TEE) WITH PROPOFOL;  Surgeon: 10/15/2013, MD;  Location: [facility] ORS;  Service: Cardiovascular;  Laterality: N/A;   Family History  Problem Relation Age of Onset  . Anesthesia problems Neg Hx   . Hypotension Neg Hx   . Malignant hyperthermia Neg Hx   . Pseudochol deficiency Neg Hx    Social  History   Socioeconomic History  . Marital status: Divorced    Spouse name: Not on file  . Number of children: Not on file  . Years of education: Not on file  . Highest education level: Not on file  Occupational History  . Occupation: disabled  Tobacco Use  . Smoking status: Current Every Day Smoker    Packs/day: 1.00    Years: 8.00    Pack years: 8.00    Types: Cigarettes  . Smokeless tobacco: Never Used  Substance and Sexual Activity  . Alcohol use: No    Comment: former- last etoh was June  . Drug use: Yes    Types: Marijuana, Cocaine, Heroin    Comment: heroin, crack,  . Sexual activity: Never  Other Topics Concern  .  Not on file  Social History Narrative   Divorced since 2004.Lives with older sister.On disability since 2015 due to CVAs.   Social Determinants of Health   Financial Resource Strain:   . Difficulty of Paying Living Expenses: Not on file  Food Insecurity:   . Worried About Programme researcher, broadcasting/film/video in the Last Year: Not on file  . Ran Out of Food in the Last Year: Not on file  Transportation Needs:   . Lack of Transportation (Medical): Not on file  . Lack of Transportation (Non-Medical): Not on file  Physical Activity:   . Days of Exercise per Week: Not on file  . Minutes of Exercise per Session: Not on file  Stress:   . Feeling of Stress : Not on file  Social Connections:   . Frequency of Communication with Friends and Family: Not on file  . Frequency of Social Gatherings with Friends and Family: Not on file  . Attends Religious Services: Not on file  . Active Member of Clubs or Organizations: Not on file  . Attends Banker Meetings: Not on file  . Marital Status: Not on file    Tobacco Counseling Ready to quit: Not Answered Counseling given: Not Answered   Clinical Intake:  Pre-visit preparation completed: Yes  Pain : No/denies pain     BMI - recorded: 24.6 Nutritional Status: BMI of 19-24  Normal Nutritional Risks: None Diabetes: No  How often do you need to have someone help you when you read instructions, pamphlets, or other written materials from your doctor or pharmacy?: 1 - Never What is the last grade level you completed in school?: 12th grade  Diabetic? No  Interpreter Needed?: No  Information entered by :: Jiles Prows, Np-C   Activities of Daily Living In your present state of health, do you have any difficulty performing the following activities: 12/21/2019  Hearing? Y  Vision? Y  Difficulty concentrating or making decisions? N  Walking or climbing stairs? N  Dressing or bathing? N  Doing errands, shopping? N  Preparing Food and eating ?  N  Using the Toilet? N  In the past six months, have you accidently leaked urine? N  Do you have problems with loss of bowel control? N  Managing your Medications? N  Managing your Finances? N  Housekeeping or managing your Housekeeping? N  Some recent data might be hidden    Patient Care Team: Wilson Singer, MD as PCP - General (Internal Medicine)  Indicate any recent Medical Services you may have received from other than [facility] providers in the past year (date may be approximate).     Assessment:   This is a routine wellness examination  for Robert Walls.  Hearing/Vision screen No exam data present  Dietary issues and exercise activities discussed: Current Exercise Habits: The patient does not participate in regular exercise at present, Exercise limited by: orthopedic condition(s)  Goals    . Exercise 150 min/wk Moderate Activity      Depression Screen PHQ 2/9 Scores 12/21/2019 10/10/2019 06/05/2019  PHQ - 2 Score 0 3 0  PHQ- 9 Score - 14 -  Exception Documentation - - Medical reason    Fall Risk Fall Risk  12/21/2019 06/05/2019 10/08/2017  Falls in the past year? 1 0 No  Number falls in past yr: 0 0 -  Injury with Fall? 0 0 -  Risk for fall due to : No Fall Risks No Fall Risks -  Follow up Education provided;Falls prevention discussed Falls evaluation completed -    Any stairs in or around the home? No  If so, are there any without handrails? N/A Home free of loose throw rugs in walkways, pet beds, electrical cords, etc? Yes  Adequate lighting in your home to reduce risk of falls? No   ASSISTIVE DEVICES UTILIZED TO PREVENT FALLS:  Life alert? No  Use of a cane, walker or w/c? No  Grab bars in the bathroom? No  Shower chair or bench in shower? No  Elevated toilet seat or a handicapped toilet? No   TIMED UP AND GO:  Was the test performed? Yes .  Length of time to ambulate 10 feet: 8 sec.   Gait steady and fast without use of assistive device  Cognitive  Function:     6CIT Screen 12/21/2019  What Year? 0 points  What month? 3 points  What time? 0 points  Count back from 20 0 points  Months in reverse 0 points  Repeat phrase 2 points  Total Score 5    Immunizations Immunization History  Administered Date(s) Administered  . Influenza,inj,Quad PF,6+ Mos 12/05/2016, 02/07/2019  . Pneumococcal Polysaccharide-23 12/05/2016  . Tdap 06/05/2019    TDAP status: Up to date Flu Vaccine status: Up to date Pneumococcal vaccine status: Up to date Covid-19 vaccine status: Declined, Education has been provided regarding the importance of this vaccine but patient still declined. Advised may receive this vaccine at local pharmacy or Health Dept.or vaccine clinic. Aware to provide a copy of the vaccination record if obtained from local pharmacy or Health Dept. Verbalized acceptance and understanding.  Qualifies for Shingles Vaccine? Yes   Zostavax completed No   Shingrix Completed?: No.    Education has been provided regarding the importance of this vaccine. Patient has been advised to call insurance company to determine out of pocket expense if they have not yet received this vaccine. Advised may also receive vaccine at local pharmacy or Health Dept. Verbalized acceptance and understanding.  Screening Tests Health Maintenance  Topic Date Due  . COVID-19 Vaccine (1) Never done  . COLONOSCOPY  Never done  . INFLUENZA VACCINE  10/22/2019  . TETANUS/TDAP  06/04/2029  . Hepatitis C Screening  Completed  . HIV Screening  Completed    Health Maintenance  Health Maintenance Due  Topic Date Due  . COVID-19 Vaccine (1) Never done  . COLONOSCOPY  Never done  . INFLUENZA VACCINE  10/22/2019    Colorectal cancer screening: Completed 2021. Repeat every 1 years - awaiting results from FIT testing  Lung Cancer Screening: (Low Dose CT Chest recommended if Age 54-80 years, 30 pack-year currently smoking OR have quit w/in 15years.) does not qualify. -  PY = ~ 11  Lung Cancer Screening Referral: N/A  Additional Screening:  Hepatitis C Screening: does not qualify; Completed 2020  Vision Screening: Recommended annual ophthalmology exams for early detection of glaucoma and other disorders of the eye. Is the patient up to date with their annual eye exam?  No  Who is the provider or what is the name of the office in which the patient attends annual eye exams? Does not have an eye doctor If pt is not established with a provider, would they like to be referred to a provider to establish care? No .   Dental Screening: Recommended annual dental exams for proper oral hygiene  Community Resource Referral / Chronic Care Management: CRR required this visit?  No   CCM required this visit?  No      Plan:     I have personally reviewed and noted the following in the patient's chart:   . Medical and social history . Use of alcohol, tobacco or illicit drugs  . Current medications and supplements . Functional ability and status . Nutritional status . Physical activity . Advanced directives . List of other physicians . Hospitalizations, surgeries, and ER visits in previous 12 months . Vitals . Screenings to include cognitive, depression, and falls . Referrals and appointments  In addition, I have reviewed and discussed with patient certain preventive protocols, quality metrics, and best practice recommendations. A written personalized care plan for preventive services as well as general preventive health recommendations were provided to patient.    Patient mentioned he is having myalgias with his pravastatin.  We will discontinue this and start him on ezetimibe instead.  Blood pressure quite low today will reduce amlodipine down to 5 mg from 10 mg daily.  Flu shot administered today.  Awaiting results of colon cancer screening.  He will follow-up as scheduled next month or sooner as needed.  Elenore Paddy, NP   12/21/2019

## 2020-01-15 ENCOUNTER — Other Ambulatory Visit (INDEPENDENT_AMBULATORY_CARE_PROVIDER_SITE_OTHER): Payer: Self-pay | Admitting: Internal Medicine

## 2020-01-15 ENCOUNTER — Other Ambulatory Visit (INDEPENDENT_AMBULATORY_CARE_PROVIDER_SITE_OTHER): Payer: Self-pay | Admitting: Nurse Practitioner

## 2020-01-15 ENCOUNTER — Ambulatory Visit (INDEPENDENT_AMBULATORY_CARE_PROVIDER_SITE_OTHER): Payer: Medicare Other | Admitting: Internal Medicine

## 2020-01-15 DIAGNOSIS — I10 Essential (primary) hypertension: Secondary | ICD-10-CM

## 2020-01-23 ENCOUNTER — Ambulatory Visit (INDEPENDENT_AMBULATORY_CARE_PROVIDER_SITE_OTHER): Payer: Medicare Other | Admitting: Nurse Practitioner

## 2020-01-23 ENCOUNTER — Other Ambulatory Visit: Payer: Self-pay

## 2020-01-23 ENCOUNTER — Other Ambulatory Visit (INDEPENDENT_AMBULATORY_CARE_PROVIDER_SITE_OTHER): Payer: Self-pay | Admitting: Nurse Practitioner

## 2020-01-23 ENCOUNTER — Encounter (INDEPENDENT_AMBULATORY_CARE_PROVIDER_SITE_OTHER): Payer: Self-pay | Admitting: Nurse Practitioner

## 2020-01-23 VITALS — BP 94/60 | HR 74 | Temp 97.5°F | Ht 66.0 in | Wt 149.6 lb

## 2020-01-23 DIAGNOSIS — I10 Essential (primary) hypertension: Secondary | ICD-10-CM

## 2020-01-23 DIAGNOSIS — E782 Mixed hyperlipidemia: Secondary | ICD-10-CM

## 2020-01-23 DIAGNOSIS — N1831 Chronic kidney disease, stage 3a: Secondary | ICD-10-CM

## 2020-01-23 DIAGNOSIS — R351 Nocturia: Secondary | ICD-10-CM

## 2020-01-23 DIAGNOSIS — N401 Enlarged prostate with lower urinary tract symptoms: Secondary | ICD-10-CM | POA: Diagnosis not present

## 2020-01-23 MED ORDER — METOPROLOL TARTRATE 25 MG PO TABS: 25.0000 mg | ORAL_TABLET | Freq: Two times a day (BID) | ORAL | 2 refills | Status: DC

## 2020-01-23 MED ORDER — ASPIRIN 81 MG PO TBEC: 81.0000 mg | DELAYED_RELEASE_TABLET | Freq: Every day | ORAL | 3 refills | Status: AC

## 2020-01-23 MED ORDER — EZETIMIBE 10 MG PO TABS: 10.0000 mg | ORAL_TABLET | Freq: Every day | ORAL | 0 refills | Status: DC

## 2020-01-23 MED ORDER — TAMSULOSIN HCL 0.4 MG PO CAPS: 0.4000 mg | ORAL_CAPSULE | Freq: Every day | ORAL | 0 refills | Status: DC

## 2020-01-23 NOTE — Progress Notes (Signed)
   Subjective:  Patient ID: Robert Walls, male    DOB: 07/22/1955  Age: 64 y.o. MRN: [id number]  CC:  Chief Complaint  Patient presents with  . Follow-up    Doing well  . Hyperlipidemia  . Hypertension  . Chronic Kidney Disease  . Benign Prostatic Hypertrophy      HPI  This patient arrives today for the above.  Hyperlipidemia: Continues on 81 mg of aspirin daily.  Was on pravastatin but was experiencing some myalgias with this so this was stopped.  He was supposedly started on ezetimibe, he tells me he is not sure if he ever started this medicine.  Hypertension: Amlodipine was changed from 10 mg/day down to 5 mg last office visit he continues on metoprolol 25 mg twice a day.  He tells me he is not sure if he actually reduced his intake of amlodipine from 10 mg to 5 mg daily.  He thinks he may be on a 10 mg tablet still at this time.  Chronic kidney disease: Stage IIIa, due for metabolic panel rechecked today.  BPH: Continues on tamsulosin without negative side effects.  Past Medical History:  Diagnosis Date  . Anxiety   . Aortic atherosclerosis (HCC)    Documented by TEE  . Arthritis   . Back pain   . CKD (chronic kidney disease) stage 3, GFR 30-59 ml/min (HCC) 02/07/2019  . Concussion 1993  . Essential hypertension, benign   . History of stroke July 2015   Small acute to subacute infarcts - TEE negative for cardioembolic source  . Impaired hearing   . Seizures (HCC)    Dilantin  . Stroke (HCC)   . Substance abuse (HCC)       Family History  Problem Relation Age of Onset  . Anesthesia problems Neg Hx   . Hypotension Neg Hx   . Malignant hyperthermia Neg Hx   . Pseudochol deficiency Neg Hx     Social History   Social History Narrative   Divorced since 2004.Lives with older sister.On disability since 2015 due to CVAs.   Social History   Tobacco Use  . Smoking status: Current Every Day Smoker    Packs/day: 1.00    Years: 8.00    Pack years: 8.00     Types: Cigarettes  . Smokeless tobacco: Never Used  Substance Use Topics  . Alcohol use: No    Comment: former- last etoh was June     Current Meds  Medication Sig  . aspirin 81 MG EC tablet Take 1 tablet (81 mg total) by mouth daily.  . ezetimibe (ZETIA) 10 MG tablet Take 1 tablet (10 mg total) by mouth daily.  . metoprolol tartrate (LOPRESSOR) 25 MG tablet Take 1 tablet (25 mg total) by mouth 2 (two) times daily.  . tamsulosin (FLOMAX) 0.4 MG CAPS capsule Take 1 capsule (0.4 mg total) by mouth daily.  . [DISCONTINUED] amLODipine (NORVASC) 5 MG tablet Take 1 tablet (5 mg total) by mouth daily.  . [DISCONTINUED] aspirin 81 MG EC tablet Take 1 tablet (81 mg total) by mouth daily.  . [DISCONTINUED] ezetimibe (ZETIA) 10 MG tablet Take 1 tablet (10 mg total) by mouth daily.  . [DISCONTINUED] metoprolol tartrate (LOPRESSOR) 25 MG tablet TAKE 1 TABLET BY MOUTH TWICE DAILY  . [DISCONTINUED] tamsulosin (FLOMAX) 0.4 MG CAPS capsule TAKE 1 CAPSULE BY MOUTH DAILY    ROS:  Review of Systems  Constitutional: Positive for malaise/fatigue.  Eyes: Negative.   Respiratory: Negative.     Cardiovascular: Negative.   Neurological: Negative.      Objective:   Today's Vitals: BP 94/60   Pulse 74   Temp (!) 97.5 F (36.4 C) (Temporal)   Ht 5' 6" (1.676 m)   Wt 149 lb 9.6 oz (67.9 kg)   SpO2 97%   BMI 24.15 kg/m  Vitals with BMI 01/23/2020 12/21/2019 10/10/2019  Height 5' 6" 5' 5" 5' 6"  Weight 149 lbs 10 oz 148 lbs 3 oz 153 lbs 3 oz  BMI 24.16 24.66 24.74  Systolic 94 90 120  Diastolic 60 50 60  Pulse 74 67 69  Some encounter information is confidential and restricted. Go to Review Flowsheets activity to see all data.     Physical Exam Vitals reviewed.  Constitutional:      Appearance: Normal appearance.  HENT:     Head: Normocephalic and atraumatic.  Cardiovascular:     Rate and Rhythm: Normal rate and regular rhythm.  Pulmonary:     Effort: Pulmonary effort is normal.     Breath  sounds: Normal breath sounds.  Musculoskeletal:     Cervical back: Neck supple.  Skin:    General: Skin is warm and dry.  Neurological:     Mental Status: He is alert and oriented to person, place, and time.  Psychiatric:        Mood and Affect: Mood normal.        Behavior: Behavior normal.        Thought Content: Thought content normal.        Judgment: Judgment normal.          Assessment and Plan   1. Mixed hyperlipidemia   2. Essential hypertension   3. Benign prostatic hyperplasia with nocturia   4. Stage 3a chronic kidney disease (HCC)      Plan: 1.  We will order metabolic panel and refill ezetimibe to patient's pharmacy today.  He was given a handout of his medication list and was told to verify that he is taking these medications only these medications.  He tells me he will check his medicine cabinet when he is home today and make sure that he is taking only what is listed.  He will return to office in about 6 weeks at which point we will recheck CMP and lipid panel.  We will check CMP today in case he has been taken at ezetimibe to monitor liver enzyme levels. 2.  We will stop amlodipine completely at this time he will continue on his metoprolol.  Again he was told to take the updated medication list and compare with medicine cabinet at home and make sure he is only taking what is listed.  He tells me he will do this. 3.  Continue on tamsulosin. 4.  We will continue to monitor patient was encouraged to continue hydrating regularly throughout the day.   Tests ordered Orders Placed This Encounter  Procedures  . CMP with eGFR([facility])      Meds ordered this encounter  Medications  . ezetimibe (ZETIA) 10 MG tablet    Sig: Take 1 tablet (10 mg total) by mouth daily.    Dispense:  90 tablet    Refill:  0    Order Specific Question:   Supervising Provider    Answer:   GOSRANI, NIMISH C [[id number]]  . aspirin 81 MG EC tablet    Sig: Take 1 tablet (81 mg total) by  mouth daily.    Dispense:  30 tablet      Refill:  3    Order Specific Question:   Supervising Provider    Answer:   Hurshel Party C [[id number]]  . metoprolol tartrate (LOPRESSOR) 25 MG tablet    Sig: Take 1 tablet (25 mg total) by mouth 2 (two) times daily.    Dispense:  60 tablet    Refill:  2    Order Specific Question:   Supervising Provider    Answer:   Hurshel Party C [[id number]]  . tamsulosin (FLOMAX) 0.4 MG CAPS capsule    Sig: Take 1 capsule (0.4 mg total) by mouth daily.    Dispense:  90 capsule    Refill:  0    Order Specific Question:   Supervising Provider    Answer:   Doree Albee [[id number]]    Patient to follow-up in 6 weeks or sooner as needed.  Ailene Ards, NP

## 2020-01-24 LAB — COMPLETE METABOLIC PANEL WITH GFR
AG Ratio: 1.5 (calc) (ref 1.0–2.5)
ALT: 17 U/L (ref 9–46)
AST: 21 U/L (ref 10–35)
Albumin: 4.5 g/dL (ref 3.6–5.1)
Alkaline phosphatase (APISO): 67 U/L (ref 35–144)
BUN/Creatinine Ratio: 15 (calc) (ref 6–22)
BUN: 33 mg/dL — ABNORMAL HIGH (ref 7–25)
CO2: 25 mmol/L (ref 20–32)
Calcium: 9.6 mg/dL (ref 8.6–10.3)
Chloride: 102 mmol/L (ref 98–110)
Creat: 2.23 mg/dL — ABNORMAL HIGH (ref 0.70–1.25)
GFR, Est African American: 35 mL/min/{1.73_m2} — ABNORMAL LOW (ref >=60)
GFR, Est Non African American: 30 mL/min/{1.73_m2} — ABNORMAL LOW (ref >=60)
Globulin: 3 g/dL (calc) (ref 1.9–3.7)
Glucose, Bld: 97 mg/dL (ref 65–99)
Potassium: 5.2 mmol/L (ref 3.5–5.3)
Sodium: 140 mmol/L (ref 135–146)
Total Bilirubin: 0.6 mg/dL (ref 0.2–1.2)
Total Protein: 7.5 g/dL (ref 6.1–8.1)

## 2020-02-26 ENCOUNTER — Other Ambulatory Visit (INDEPENDENT_AMBULATORY_CARE_PROVIDER_SITE_OTHER): Payer: Self-pay | Admitting: Internal Medicine

## 2020-03-05 ENCOUNTER — Ambulatory Visit (INDEPENDENT_AMBULATORY_CARE_PROVIDER_SITE_OTHER): Payer: Medicare Other | Admitting: Internal Medicine

## 2020-06-12 ENCOUNTER — Other Ambulatory Visit (INDEPENDENT_AMBULATORY_CARE_PROVIDER_SITE_OTHER): Payer: Self-pay | Admitting: Nurse Practitioner

## 2020-06-12 DIAGNOSIS — N401 Enlarged prostate with lower urinary tract symptoms: Secondary | ICD-10-CM

## 2020-06-17 ENCOUNTER — Encounter (INDEPENDENT_AMBULATORY_CARE_PROVIDER_SITE_OTHER): Payer: Medicare Other | Admitting: Nurse Practitioner

## 2020-06-17 ENCOUNTER — Ambulatory Visit (INDEPENDENT_AMBULATORY_CARE_PROVIDER_SITE_OTHER): Payer: Medicare Other | Admitting: Internal Medicine

## 2020-06-18 ENCOUNTER — Other Ambulatory Visit (INDEPENDENT_AMBULATORY_CARE_PROVIDER_SITE_OTHER): Payer: Self-pay | Admitting: Internal Medicine

## 2020-06-26 ENCOUNTER — Telehealth (INDEPENDENT_AMBULATORY_CARE_PROVIDER_SITE_OTHER): Payer: Self-pay

## 2020-06-26 NOTE — Telephone Encounter (Signed)
Patient called and left a detailed voice message and apologized for missing his last appointment. Patient stated that he does not know why he is forgetful and not remembering his appointments.   Called patient and left a detailed voice message to let patient know to call back to reschedule his appointment.

## 2020-07-16 ENCOUNTER — Encounter (INDEPENDENT_AMBULATORY_CARE_PROVIDER_SITE_OTHER): Payer: Self-pay | Admitting: Nurse Practitioner

## 2020-07-16 ENCOUNTER — Ambulatory Visit (INDEPENDENT_AMBULATORY_CARE_PROVIDER_SITE_OTHER): Payer: Medicare Other | Admitting: Nurse Practitioner

## 2020-07-16 ENCOUNTER — Telehealth (INDEPENDENT_AMBULATORY_CARE_PROVIDER_SITE_OTHER): Payer: Self-pay | Admitting: Nurse Practitioner

## 2020-07-16 ENCOUNTER — Other Ambulatory Visit: Payer: Self-pay

## 2020-07-16 VITALS — BP 144/80 | HR 69 | Temp 98.0°F | Ht 64.0 in | Wt 136.0 lb

## 2020-07-16 DIAGNOSIS — Z1211 Encounter for screening for malignant neoplasm of colon: Secondary | ICD-10-CM

## 2020-07-16 DIAGNOSIS — Z131 Encounter for screening for diabetes mellitus: Secondary | ICD-10-CM

## 2020-07-16 DIAGNOSIS — R739 Hyperglycemia, unspecified: Secondary | ICD-10-CM

## 2020-07-16 DIAGNOSIS — R5383 Other fatigue: Secondary | ICD-10-CM | POA: Diagnosis not present

## 2020-07-16 DIAGNOSIS — R634 Abnormal weight loss: Secondary | ICD-10-CM | POA: Diagnosis not present

## 2020-07-16 DIAGNOSIS — E782 Mixed hyperlipidemia: Secondary | ICD-10-CM | POA: Diagnosis not present

## 2020-07-16 DIAGNOSIS — Z0001 Encounter for general adult medical examination with abnormal findings: Secondary | ICD-10-CM

## 2020-07-16 DIAGNOSIS — N1831 Chronic kidney disease, stage 3a: Secondary | ICD-10-CM | POA: Diagnosis not present

## 2020-07-16 DIAGNOSIS — E559 Vitamin D deficiency, unspecified: Secondary | ICD-10-CM | POA: Diagnosis not present

## 2020-07-16 DIAGNOSIS — I10 Essential (primary) hypertension: Secondary | ICD-10-CM | POA: Diagnosis not present

## 2020-07-16 DIAGNOSIS — Z125 Encounter for screening for malignant neoplasm of prostate: Secondary | ICD-10-CM

## 2020-07-16 NOTE — Patient Instructions (Signed)
[employer]ep[employer] Nutrition Shake - Drink 1 shake twice a day between meals

## 2020-07-16 NOTE — Progress Notes (Signed)
   Subjective:  Patient ID: Robert Walls, male    DOB: 08/10/1955  Age: 64 y.o. MRN: [id number]  CC:  Chief Complaint  Patient presents with  . Annual Exam    Doing okay      HPI  This patient arrives today for the above.  He has no concerns today and tells me overall he is feeling good.  He is up-to-date on immunizations except for COVID-19 vaccine.  He is not interested in having this administered.  He has not yet had colon cancer completed.  We did send referral back in July 2021, it appears that the office did reach out to him via questionnaire in the mail.  Note say they never got the questionnaire back so his appoint was never scheduled.  He also is due for prostate cancer screening via PSA.  He is a current smoker but pack years approximately 12.75.  He tells me he has been smoking for about 17 years (since 2005), and smokes about 3/4 pack/day.  Past Medical History:  Diagnosis Date  . Anxiety   . Aortic atherosclerosis (HCC)    Documented by TEE  . Arthritis   . Back pain   . CKD (chronic kidney disease) stage 3, GFR 30-59 ml/min (HCC) 02/07/2019  . Concussion 1993  . Essential hypertension, benign   . History of stroke July 2015   Small acute to subacute infarcts - TEE negative for cardioembolic source  . Impaired hearing   . Seizures (HCC)    Dilantin  . Stroke (HCC)   . Substance abuse (HCC)       Family History  Problem Relation Age of Onset  . Anesthesia problems Neg Hx   . Hypotension Neg Hx   . Malignant hyperthermia Neg Hx   . Pseudochol deficiency Neg Hx     Social History   Social History Narrative   Divorced since 2004.Lives with older sister.On disability since 2015 due to CVAs.   Social History   Tobacco Use  . Smoking status: Current Every Day Smoker    Packs/day: 1.00    Years: 8.00    Pack years: 8.00    Types: Cigarettes  . Smokeless tobacco: Never Used  Substance Use Topics  . Alcohol use: No    Comment: former- last etoh  was June     Current Meds  Medication Sig  . aspirin 81 MG EC tablet Take 1 tablet (81 mg total) by mouth daily.  . ezetimibe (ZETIA) 10 MG tablet Take 1 tablet (10 mg total) by mouth daily.  . metoprolol tartrate (LOPRESSOR) 25 MG tablet Take 1 tablet (25 mg total) by mouth 2 (two) times daily.  . tamsulosin (FLOMAX) 0.4 MG CAPS capsule TAKE ONE CAPSULE BY MOUTH DAILY    ROS:  Review of Systems  Constitutional: Positive for malaise/fatigue and weight loss. Negative for fever.  HENT: Positive for hearing loss. Negative for ear pain.   Eyes: Negative for blurred vision.  Respiratory: Positive for shortness of breath (with exertion). Negative for cough.   Cardiovascular: Negative for chest pain.  Gastrointestinal: Negative for abdominal pain and blood in stool.  Neurological: Negative for dizziness and headaches.     Objective:   Today's Vitals: BP (!) 144/80   Pulse 69   Temp 98 F (36.7 C) (Temporal)   Ht 5' 4" (1.626 m)   Wt 136 lb (61.7 kg)   SpO2 99%   BMI 23.34 kg/m  Vitals with BMI 07/16/2020 01/23/2020   12/21/2019  Height 5' 4" 5' 6" 5' 5"  Weight 136 lbs 149 lbs 10 oz 148 lbs 3 oz  BMI 23.33 24.16 24.66  Systolic 144 94 90  Diastolic 80 60 50  Pulse 69 74 67  Some encounter information is confidential and restricted. Go to Review Flowsheets activity to see all data.     Physical Exam Vitals reviewed.  Constitutional:      General: He is not in acute distress.    Appearance: Normal appearance. He is not ill-appearing.  HENT:     Head: Normocephalic and atraumatic.     Right Ear: Ear canal and external ear normal. Tympanic membrane is perforated.     Left Ear: Tympanic membrane, ear canal and external ear normal.  Eyes:     General: No scleral icterus.    Extraocular Movements: Extraocular movements intact.     Conjunctiva/sclera: Conjunctivae normal.     Pupils: Pupils are equal, round, and reactive to light.  Neck:     Vascular: No carotid bruit.   Cardiovascular:     Rate and Rhythm: Normal rate and regular rhythm.     Pulses: Normal pulses.     Heart sounds: Normal heart sounds.  Pulmonary:     Effort: Pulmonary effort is normal.     Breath sounds: Normal breath sounds.  Abdominal:     General: Bowel sounds are normal. There is no distension.     Palpations: There is no mass.     Tenderness: There is no abdominal tenderness.     Hernia: No hernia is present.  Musculoskeletal:        General: No swelling or tenderness.     Cervical back: Normal range of motion and neck supple. No rigidity.  Lymphadenopathy:     Cervical: No cervical adenopathy.  Skin:    General: Skin is warm and dry.  Neurological:     General: No focal deficit present.     Mental Status: He is alert and oriented to person, place, and time.     Cranial Nerves: No cranial nerve deficit.     Sensory: No sensory deficit.     Motor: No weakness.     Gait: Gait normal.  Psychiatric:        Mood and Affect: Mood normal.        Behavior: Behavior normal.        Judgment: Judgment normal.          Assessment and Plan   1. Colon cancer screening   2. Unintentional weight loss   3. Fatigue, unspecified type   4. Essential hypertension   5. Stage 3a chronic kidney disease (HCC)   6. Mixed hyperlipidemia   7. Screening for diabetes mellitus   8. Prostate cancer screening   9. Vitamin D insufficiency   10. Hyperglycemia   11. Encounter for general adult medical examination with abnormal findings      Plan: 1.  We will rerefer to gastroenterology to discuss colon cancer screening. 2.  We will collect blood work for further evaluation.  I recommended he try nutritional meal replacement shakes in between meals during the day.  He tells me he eats probably 2 meals a day but snacks throughout the day.  I encouraged him to continue eating throughout the day if possible.  He does have a pack year of 12.75 and he is a current smoker but he would not  qualify for lung cancer screening.  May consider chest x-ray   if all other work-up is negative.  He will follow-up in 2 months for close monitoring of weight. 3.  We will collect blood work for further evaluation. 4.  Blood pressure acceptable on current regimen we will continue to monitor closely. 5.  He will follow-up with nephrology as scheduled. 6.  We will check lipid panel today. 7.,  10.  We will check A1c today. 8.  We will check PSA today. 9.  We will check vitamin D level today.  Tests ordered Orders Placed This Encounter  Procedures  . CBC with Differential/Platelets  . CMP with eGFR([facility])  . Lipid Panel  . Hemoglobin A1c  . TSH  . Vitamin D, 25-hydroxy  . PSA  . Sedimentation Rate  . C-reactive Protein  . Ambulatory referral to Gastroenterology      No orders of the defined types were placed in this encounter.   Patient to follow-up in 2 months or sooner as needed.  In addition to performing an annual physical exam I also performed an office visit to address the concerns, chronic conditions, and abnormalities as stated above.  Ailene Ards, NP

## 2020-07-16 NOTE — Telephone Encounter (Signed)
FYI referring patient to GI for colon cancer screening. Please refer to Dr. Aura Fey office.

## 2020-07-16 NOTE — Telephone Encounter (Signed)
I sent to [facility]; sorry

## 2020-07-17 ENCOUNTER — Other Ambulatory Visit (INDEPENDENT_AMBULATORY_CARE_PROVIDER_SITE_OTHER): Payer: Self-pay | Admitting: Nurse Practitioner

## 2020-07-17 ENCOUNTER — Encounter (INDEPENDENT_AMBULATORY_CARE_PROVIDER_SITE_OTHER): Payer: Self-pay | Admitting: *Deleted

## 2020-07-17 ENCOUNTER — Encounter (INDEPENDENT_AMBULATORY_CARE_PROVIDER_SITE_OTHER): Payer: Self-pay | Admitting: Nurse Practitioner

## 2020-07-17 DIAGNOSIS — R5383 Other fatigue: Secondary | ICD-10-CM

## 2020-07-17 DIAGNOSIS — R700 Elevated erythrocyte sedimentation rate: Secondary | ICD-10-CM

## 2020-07-17 DIAGNOSIS — R7982 Elevated C-reactive protein (CRP): Secondary | ICD-10-CM

## 2020-07-17 DIAGNOSIS — R634 Abnormal weight loss: Secondary | ICD-10-CM

## 2020-07-17 LAB — CBC WITH DIFFERENTIAL/PLATELET
Absolute Monocytes: 772 cells/uL (ref 200–950)
Basophils Absolute: 47 cells/uL (ref 0–200)
Basophils Relative: 0.5 %
Eosinophils Absolute: 205 cells/uL (ref 15–500)
Eosinophils Relative: 2.2 %
HCT: 35.2 % — ABNORMAL LOW (ref 38.5–50.0)
Hemoglobin: 11.6 g/dL — ABNORMAL LOW (ref 13.2–17.1)
Lymphs Abs: 1758 cells/uL (ref 850–3900)
MCH: 30.1 pg (ref 27.0–33.0)
MCHC: 33 g/dL (ref 32.0–36.0)
MCV: 91.2 fL (ref 80.0–100.0)
MPV: 10.4 fL (ref 7.5–12.5)
Monocytes Relative: 8.3 %
Neutro Abs: 6519 cells/uL (ref 1500–7800)
Neutrophils Relative %: 70.1 %
Platelets: 227 10*3/uL (ref 140–400)
RBC: 3.86 10*6/uL — ABNORMAL LOW (ref 4.20–5.80)
RDW: 12.4 % (ref 11.0–15.0)
Total Lymphocyte: 18.9 %
WBC: 9.3 10*3/uL (ref 3.8–10.8)

## 2020-07-17 LAB — LIPID PANEL
Cholesterol: 102 mg/dL (ref <200)
HDL: 44 mg/dL (ref >=40)
LDL Cholesterol (Calc): 42 mg/dL (calc)
Non-HDL Cholesterol (Calc): 58 mg/dL (calc) (ref <130)
Total CHOL/HDL Ratio: 2.3 (calc) (ref <5.0)
Triglycerides: 80 mg/dL (ref <150)

## 2020-07-17 LAB — HEMOGLOBIN A1C
Hgb A1c MFr Bld: 5.5 % of total Hgb (ref <5.7)
Mean Plasma Glucose: 111 mg/dL
eAG (mmol/L): 6.2 mmol/L

## 2020-07-17 LAB — COMPLETE METABOLIC PANEL WITH GFR
AG Ratio: 1.2 (calc) (ref 1.0–2.5)
ALT: 11 U/L (ref 9–46)
AST: 16 U/L (ref 10–35)
Albumin: 3.6 g/dL (ref 3.6–5.1)
Alkaline phosphatase (APISO): 78 U/L (ref 35–144)
BUN/Creatinine Ratio: 16 (calc) (ref 6–22)
BUN: 21 mg/dL (ref 7–25)
CO2: 28 mmol/L (ref 20–32)
Calcium: 8.5 mg/dL — ABNORMAL LOW (ref 8.6–10.3)
Chloride: 104 mmol/L (ref 98–110)
Creat: 1.35 mg/dL — ABNORMAL HIGH (ref 0.70–1.25)
GFR, Est African American: 65 mL/min/{1.73_m2} (ref >=60)
GFR, Est Non African American: 56 mL/min/{1.73_m2} — ABNORMAL LOW (ref >=60)
Globulin: 2.9 g/dL (calc) (ref 1.9–3.7)
Glucose, Bld: 100 mg/dL (ref 65–139)
Potassium: 4.3 mmol/L (ref 3.5–5.3)
Sodium: 139 mmol/L (ref 135–146)
Total Bilirubin: 0.7 mg/dL (ref 0.2–1.2)
Total Protein: 6.5 g/dL (ref 6.1–8.1)

## 2020-07-17 LAB — VITAMIN D 25 HYDROXY (VIT D DEFICIENCY, FRACTURES): Vit D, 25-Hydroxy: 25 ng/mL — ABNORMAL LOW (ref 30–100)

## 2020-07-17 LAB — C-REACTIVE PROTEIN: CRP: 52.5 mg/L — ABNORMAL HIGH (ref <8.0)

## 2020-07-17 LAB — SEDIMENTATION RATE: Sed Rate: 39 mm/h — ABNORMAL HIGH (ref 0–20)

## 2020-07-17 LAB — PSA: PSA: 0.24 ng/mL (ref <=4.00)

## 2020-07-17 LAB — TSH: TSH: 0.62 mIU/L (ref 0.40–4.50)

## 2020-07-17 NOTE — Progress Notes (Signed)
This encounter was created in error - please disregard.

## 2020-07-17 NOTE — Progress Notes (Signed)
Just FYI, ordering CT scan with contrast of chest, abdomen, and pelvis for further evaluation of unintentional weight loss, fatigue, elevated sed rate, and elevated CRP. Concern for possible malignancy.

## 2020-07-22 ENCOUNTER — Encounter (INDEPENDENT_AMBULATORY_CARE_PROVIDER_SITE_OTHER): Payer: Self-pay

## 2020-07-22 ENCOUNTER — Telehealth (INDEPENDENT_AMBULATORY_CARE_PROVIDER_SITE_OTHER): Payer: Self-pay

## 2020-07-22 NOTE — Telephone Encounter (Signed)
Just put a letter in the mail to go out today.

## 2020-07-22 NOTE — Telephone Encounter (Signed)
Try to send a letter if you are unable to get him on the phone.

## 2020-07-23 NOTE — Telephone Encounter (Signed)
Thank you :)

## 2020-07-29 ENCOUNTER — Ambulatory Visit (HOSPITAL_BASED_OUTPATIENT_CLINIC_OR_DEPARTMENT_OTHER): Admission: RE | Admit: 2020-07-29 | Payer: Medicare Other | Source: Ambulatory Visit

## 2020-08-21 ENCOUNTER — Telehealth (INDEPENDENT_AMBULATORY_CARE_PROVIDER_SITE_OTHER): Payer: Self-pay | Admitting: Nurse Practitioner

## 2020-08-21 NOTE — Telephone Encounter (Signed)
Can you look into CT scan that was ordered by me for this patient in April? I don't see where it has been scheduled. Thank you.

## 2020-08-21 NOTE — Telephone Encounter (Signed)
Will you look into referral to GI for this patient? It was ordered in April, and per notes I just see that a questionnaire was sent out to the patient, but I do not see that appointment has been scheduled. Thank you.

## 2020-08-22 NOTE — Telephone Encounter (Signed)
Okay, thank you!

## 2020-08-22 NOTE — Telephone Encounter (Signed)
So the last image appts was set. And given to patient. But he was a "no show". Will have to get him to call back to reschedule. Will try to contact him today.

## 2020-08-26 ENCOUNTER — Encounter (INDEPENDENT_AMBULATORY_CARE_PROVIDER_SITE_OTHER): Payer: Self-pay

## 2020-08-26 NOTE — Telephone Encounter (Signed)
It was older order. Pt was schedule & insurance PA was gotten. Pt was a no show. No way to leave a message for the patient. So a letter was mail out with information and appt to home address/.

## 2020-08-31 ENCOUNTER — Other Ambulatory Visit (INDEPENDENT_AMBULATORY_CARE_PROVIDER_SITE_OTHER): Payer: Self-pay | Admitting: Nurse Practitioner

## 2020-08-31 DIAGNOSIS — E782 Mixed hyperlipidemia: Secondary | ICD-10-CM

## 2020-08-31 DIAGNOSIS — R351 Nocturia: Secondary | ICD-10-CM

## 2020-08-31 DIAGNOSIS — N401 Enlarged prostate with lower urinary tract symptoms: Secondary | ICD-10-CM

## 2020-09-06 ENCOUNTER — Other Ambulatory Visit (INDEPENDENT_AMBULATORY_CARE_PROVIDER_SITE_OTHER): Payer: Self-pay | Admitting: Internal Medicine

## 2020-09-06 DIAGNOSIS — I10 Essential (primary) hypertension: Secondary | ICD-10-CM

## 2020-09-18 ENCOUNTER — Ambulatory Visit (INDEPENDENT_AMBULATORY_CARE_PROVIDER_SITE_OTHER): Payer: Medicare Other | Admitting: Nurse Practitioner

## 2020-09-18 ENCOUNTER — Encounter (INDEPENDENT_AMBULATORY_CARE_PROVIDER_SITE_OTHER): Payer: Self-pay | Admitting: Nurse Practitioner

## 2020-10-17 ENCOUNTER — Other Ambulatory Visit: Payer: Self-pay

## 2020-10-17 ENCOUNTER — Encounter (INDEPENDENT_AMBULATORY_CARE_PROVIDER_SITE_OTHER): Payer: Self-pay | Admitting: Nurse Practitioner

## 2020-10-17 ENCOUNTER — Ambulatory Visit (INDEPENDENT_AMBULATORY_CARE_PROVIDER_SITE_OTHER): Payer: Medicare Other | Admitting: Nurse Practitioner

## 2020-10-17 VITALS — BP 136/66 | HR 92 | Temp 98.4°F | Resp 18 | Ht 64.0 in | Wt 131.4 lb

## 2020-10-17 DIAGNOSIS — E782 Mixed hyperlipidemia: Secondary | ICD-10-CM | POA: Diagnosis not present

## 2020-10-17 DIAGNOSIS — Z5941 Food insecurity: Secondary | ICD-10-CM

## 2020-10-17 DIAGNOSIS — I10 Essential (primary) hypertension: Secondary | ICD-10-CM

## 2020-10-17 DIAGNOSIS — R634 Abnormal weight loss: Secondary | ICD-10-CM | POA: Diagnosis not present

## 2020-10-17 NOTE — Progress Notes (Signed)
Subjective:  Patient ID: Robert Walls, male    DOB: December 13, 1955  Age: 65 y.o. MRN: [id number]  CC:  Chief Complaint  Patient presents with   Other    Unintentional Weight Loss   Hyperlipidemia   Hypertension      HPI  This patient arrives today for the above.  Unintentional Weight Loss: Patient has been losing weight unintentionally. Has lost an additional 5lbs since last OV. Blood work has shown elevated CRP and sed rate. Had ordered CT of chest, abd, and pelvis but patient never went to have the scans completed. Blood work also showed mild anemia, CKD, normal A1C, normal TSH, low PSA (0.24). He is a current smoker. Upon further questioning today he does admit to having food insecurity.  He tells me sometimes he does not have the money to buy food and therefore only eats 1 meal a day at times.  Hyperlipidemia: Last LDL was 44.  He continues on Zetia and aspirin.  Hypertension: He continues on metoprolol and tolerates it well.  Past Medical History:  Diagnosis Date   Anxiety    Aortic atherosclerosis ([place])    Documented by TEE   Arthritis    Back pain    CKD (chronic kidney disease) stage 3, GFR 30-59 ml/min ([place]) 02/07/2019   Concussion 1993   Essential hypertension, benign    History of stroke July 2015   Small acute to subacute infarcts - TEE negative for cardioembolic source   Impaired hearing    Seizures (HCC)    Dilantin   Stroke ([place])    Substance abuse ([place])       Family History  Problem Relation Age of Onset   Anesthesia problems Neg Hx    Hypotension Neg Hx    Malignant hyperthermia Neg Hx    Pseudochol deficiency Neg Hx     Social History   Social History Narrative   Divorced since 2004.Lives with older sister.On disability since 2015 due to CVAs.   Social History   Tobacco Use   Smoking status: Every Day    Packs/day: 1.00    Years: 8.00    Pack years: 8.00    Types: Cigarettes   Smokeless tobacco: Never  Substance Use Topics    Alcohol use: No    Comment: former- last etoh was June     Current Meds  Medication Sig   aspirin 81 MG EC tablet Take 1 tablet (81 mg total) by mouth daily.   ezetimibe (ZETIA) 10 MG tablet TAKE 1 TABLET BY MOUTH EVERY DAY   metoprolol tartrate (LOPRESSOR) 25 MG tablet TAKE 1 TABLET BY MOUTH TWICE DAILY   tamsulosin (FLOMAX) 0.4 MG CAPS capsule TAKE ONE CAPSULE BY MOUTH DAILY    ROS:  Review of Systems  Constitutional:  Positive for malaise/fatigue. Negative for weight loss.  Respiratory:  Negative for shortness of breath.   Cardiovascular:  Negative for chest pain.  Neurological:  Negative for dizziness and headaches.    Objective:   Today's Vitals: BP 136/66 (BP Location: Left Arm, Patient Position: Sitting, Cuff Size: Small) Comment: clamy to the touch all over.  Pulse 92   Temp 98.4 F (36.9 C) (Temporal)   Resp 18   Ht _0  (1.626 m)   Wt 131 lb 6.4 oz (59.6 kg)   SpO2 97%   BMI 22.55 kg/m  Vitals with BMI 10/17/2020 07/16/2020 01/23/2020  Height _1  _2  _3   Weight 131 lbs 6 oz  136 lbs 149 lbs 10 oz  BMI 22.54 30.94 07.68  Systolic 088 110 94  Diastolic 66 80 60  Pulse 92 69 74  Some encounter information is confidential and restricted. Go to Review Flowsheets activity to see all data.     Physical Exam Vitals reviewed.  Constitutional:      Appearance: Normal appearance.  HENT:     Head: Normocephalic and atraumatic.  Cardiovascular:     Rate and Rhythm: Normal rate and regular rhythm.  Pulmonary:     Effort: Pulmonary effort is normal.     Breath sounds: Normal breath sounds.  Musculoskeletal:     Cervical back: Neck supple.  Skin:    General: Skin is warm and dry.  Neurological:     Mental Status: He is alert and oriented to person, place, and time.  Psychiatric:        Mood and Affect: Mood normal.        Behavior: Behavior normal.        Thought Content: Thought content normal.        Judgment: Judgment normal.         Assessment  and Plan   1. Food insecurity   2. Mixed hyperlipidemia   3. Unintentional weight loss   4. Essential hypertension      Plan: 1.,  3.  Will refer him to social work to see if they can help assist him with making sure he has access to 3 meals a day.  We were able to get CT scan rescheduled for this upcoming Monday and patient was notified of appointment date and time in person and is agreeable to presenting to the CT scan on Monday.  Further recommendations be made based upon these results. 2.  He will continue take medications as currently prescribed. 4.  He will continue taking metoprolol as prescribed.  We will check CMP.   Tests ordered Orders Placed This Encounter  Procedures   CMP with eGFR([facility])   Ambulatory referral to Social Work      No orders of the defined types were placed in this encounter.   Patient to follow-up in 6 weeks or sooner as needed.  Ailene Ards, NP

## 2020-10-18 ENCOUNTER — Other Ambulatory Visit (INDEPENDENT_AMBULATORY_CARE_PROVIDER_SITE_OTHER): Payer: Self-pay | Admitting: Nurse Practitioner

## 2020-10-18 DIAGNOSIS — E876 Hypokalemia: Secondary | ICD-10-CM

## 2020-10-18 LAB — COMPLETE METABOLIC PANEL WITH GFR
AG Ratio: 1.2 (calc) (ref 1.0–2.5)
ALT: 14 U/L (ref 9–46)
AST: 14 U/L (ref 10–35)
Albumin: 3.8 g/dL (ref 3.6–5.1)
Alkaline phosphatase (APISO): 63 U/L (ref 35–144)
BUN/Creatinine Ratio: 14 (calc) (ref 6–22)
BUN: 28 mg/dL — ABNORMAL HIGH (ref 7–25)
CO2: 29 mmol/L (ref 20–32)
Calcium: 8.6 mg/dL (ref 8.6–10.3)
Chloride: 105 mmol/L (ref 98–110)
Creat: 2.06 mg/dL — ABNORMAL HIGH (ref 0.70–1.35)
Globulin: 3.1 g/dL (calc) (ref 1.9–3.7)
Glucose, Bld: 108 mg/dL (ref 65–139)
Potassium: 3.4 mmol/L — ABNORMAL LOW (ref 3.5–5.3)
Sodium: 144 mmol/L (ref 135–146)
Total Bilirubin: 0.3 mg/dL (ref 0.2–1.2)
Total Protein: 6.9 g/dL (ref 6.1–8.1)
eGFR: 36 mL/min/{1.73_m2} — ABNORMAL LOW (ref >=60)

## 2020-10-21 ENCOUNTER — Ambulatory Visit (HOSPITAL_COMMUNITY): Payer: Medicare Other

## 2020-10-21 ENCOUNTER — Encounter (INDEPENDENT_AMBULATORY_CARE_PROVIDER_SITE_OTHER): Payer: Self-pay

## 2020-10-21 NOTE — Progress Notes (Signed)
Patient called.  Unable to reach patient.Phone contact is disconnected. Will send a letter out with lab results.

## 2020-10-21 NOTE — Progress Notes (Signed)
Printed copy & mail to patient home address

## 2020-10-24 ENCOUNTER — Encounter (INDEPENDENT_AMBULATORY_CARE_PROVIDER_SITE_OTHER): Payer: Self-pay

## 2020-12-04 ENCOUNTER — Ambulatory Visit (INDEPENDENT_AMBULATORY_CARE_PROVIDER_SITE_OTHER): Payer: Medicare Other | Admitting: Nurse Practitioner

## 2021-01-01 ENCOUNTER — Ambulatory Visit (INDEPENDENT_AMBULATORY_CARE_PROVIDER_SITE_OTHER): Payer: Medicare Other | Admitting: Nurse Practitioner

## 2021-02-26 ENCOUNTER — Encounter (INDEPENDENT_AMBULATORY_CARE_PROVIDER_SITE_OTHER): Payer: Medicare Other | Admitting: Nurse Practitioner

## 2021-03-01 DIAGNOSIS — I469 Cardiac arrest, cause unspecified: Secondary | ICD-10-CM | POA: Diagnosis not present

## 2021-09-28 IMAGING — US US RENAL
1 series · 14 of 25 positions shown · non-contrast
Comparison: March 14, 2018

CLINICAL DATA: Chronic renal disease

EXAM:
RENAL / URINARY TRACT ULTRASOUND COMPLETE

[Series 1: us renal · 14 of 49 slices shown]
[im 1/49]
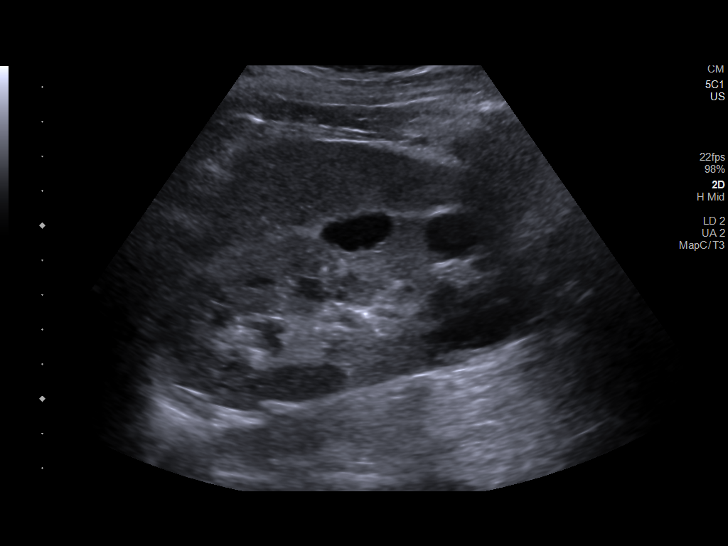
[im 5/49]
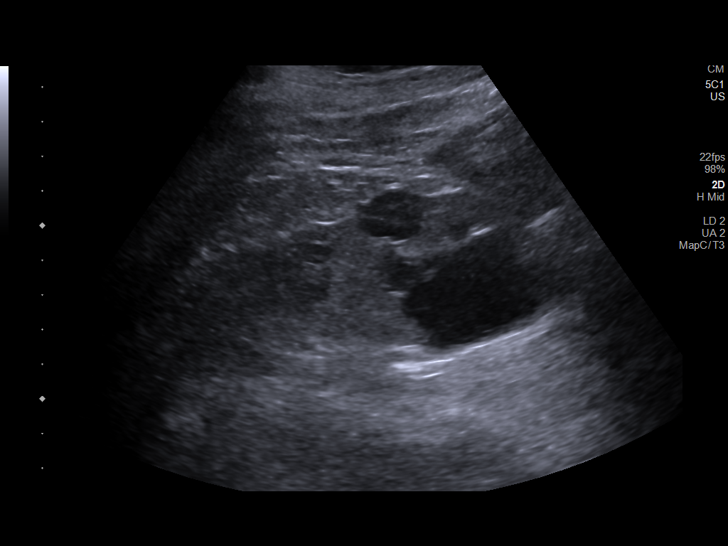
[im 9/49]
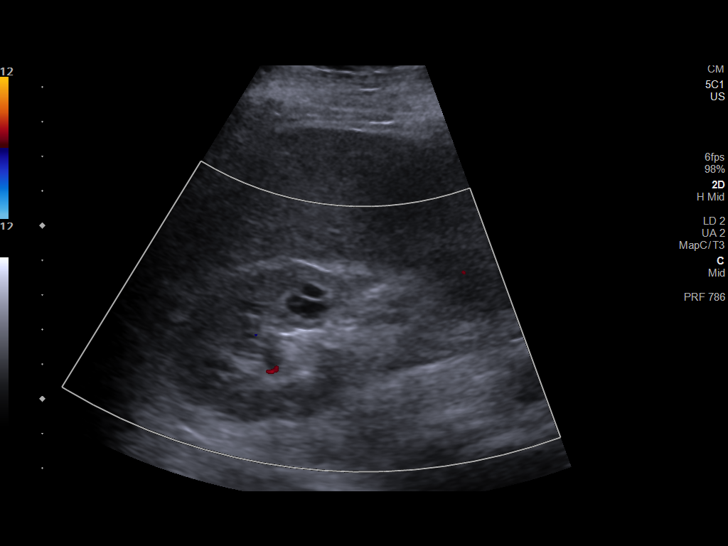
[im 13/49]
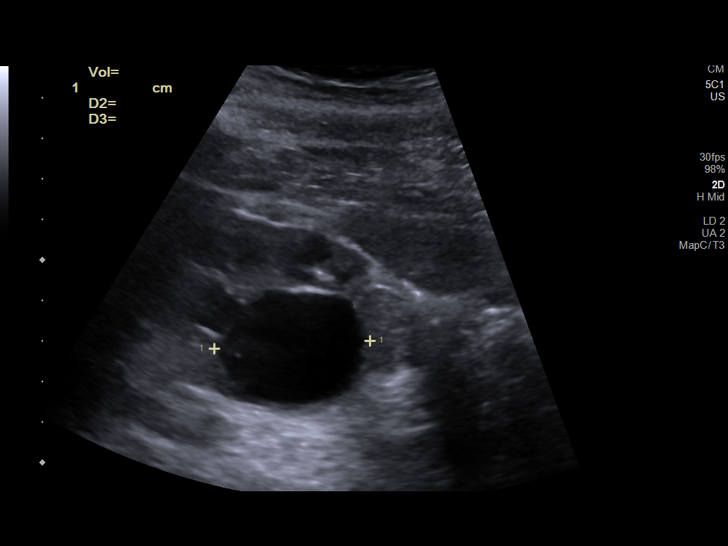
[im 17/49]
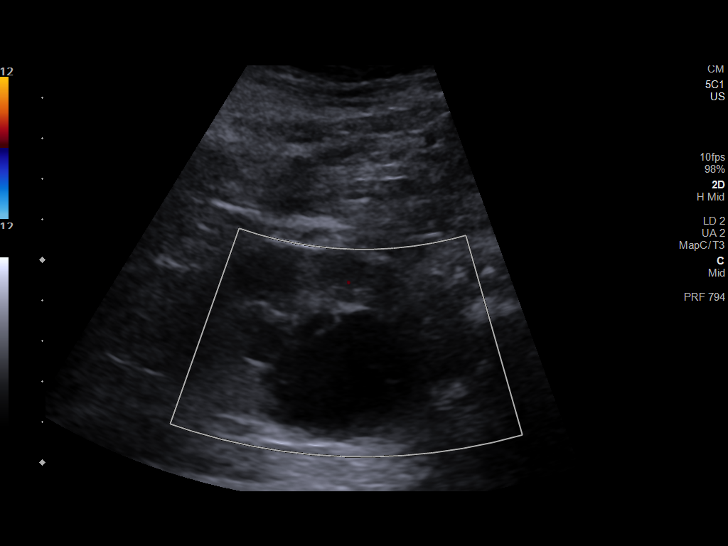
[im 19/49]
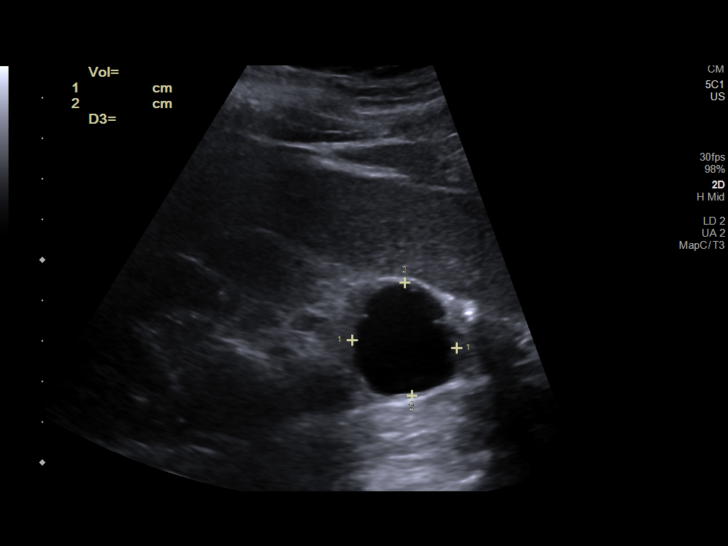
[im 23/49]
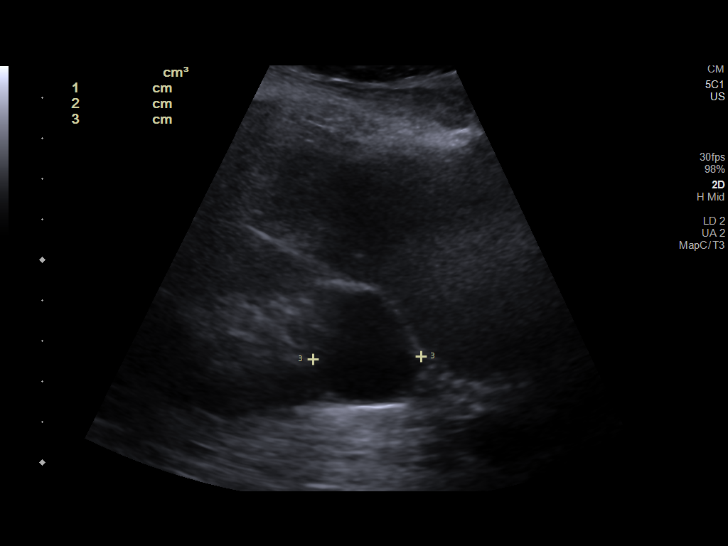
[im 27/49]
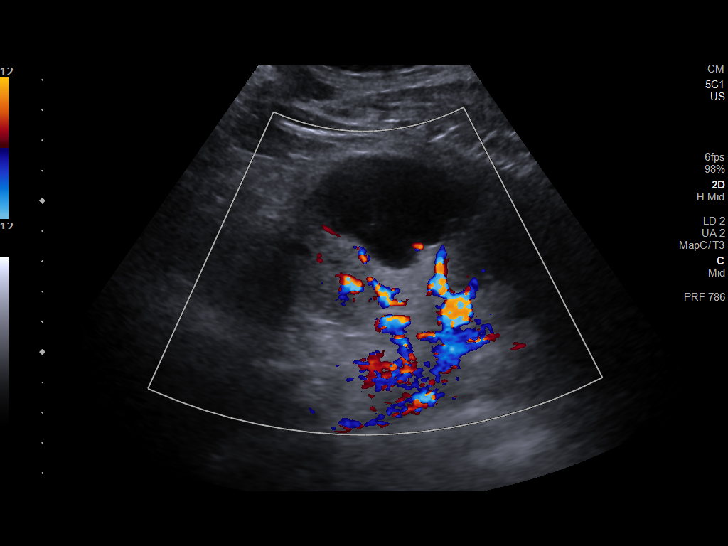
[im 31/49]
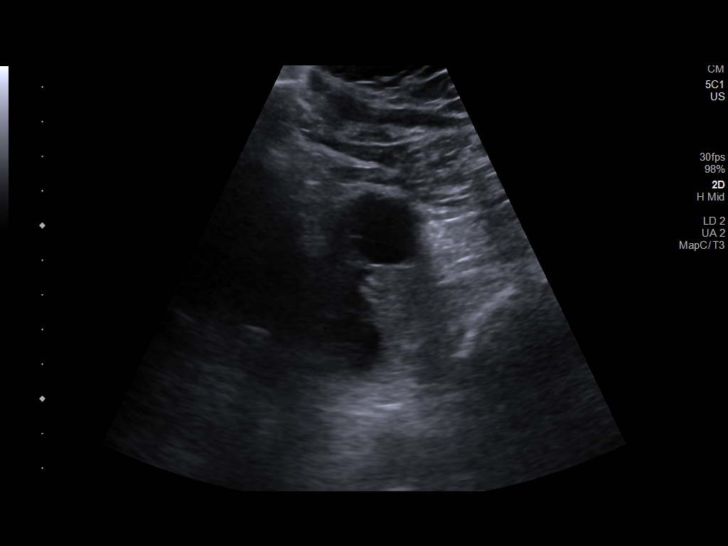
[im 33/49]
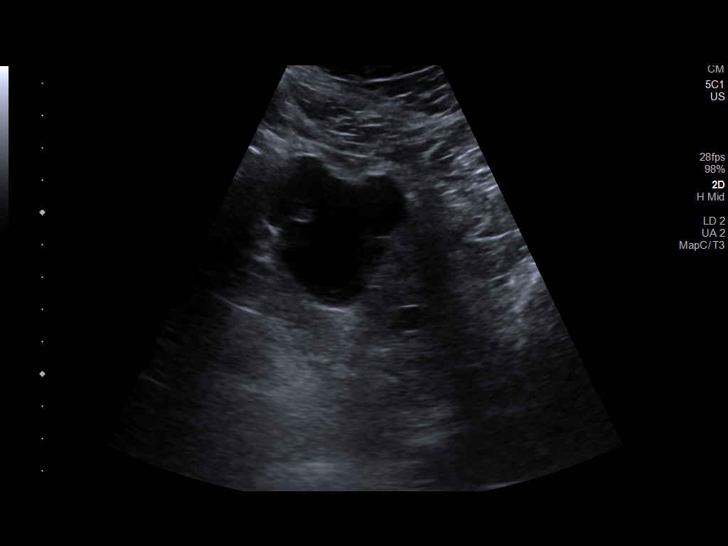
[im 37/49]
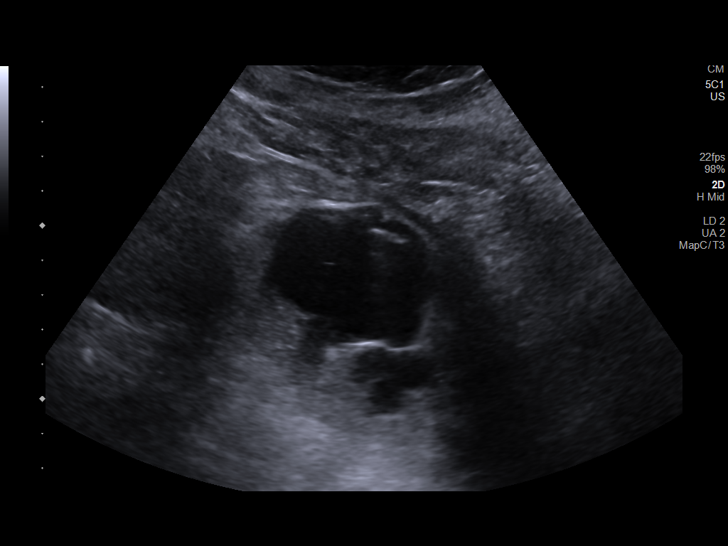
[im 41/49]
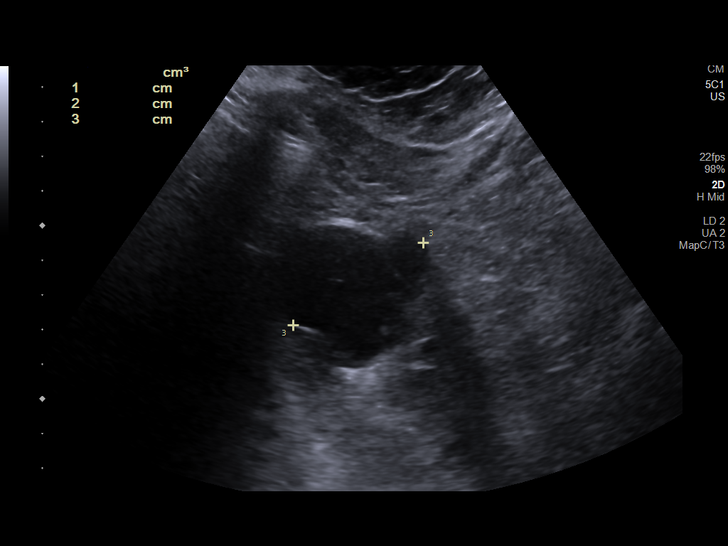
[im 45/49]
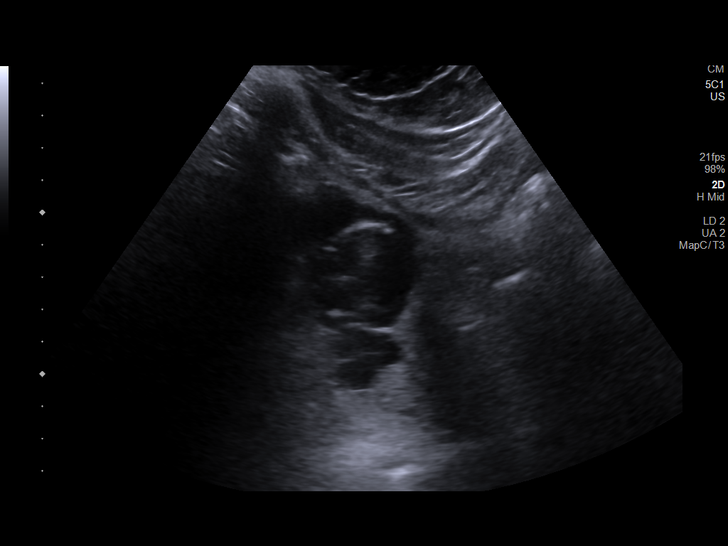
[im 49/49]
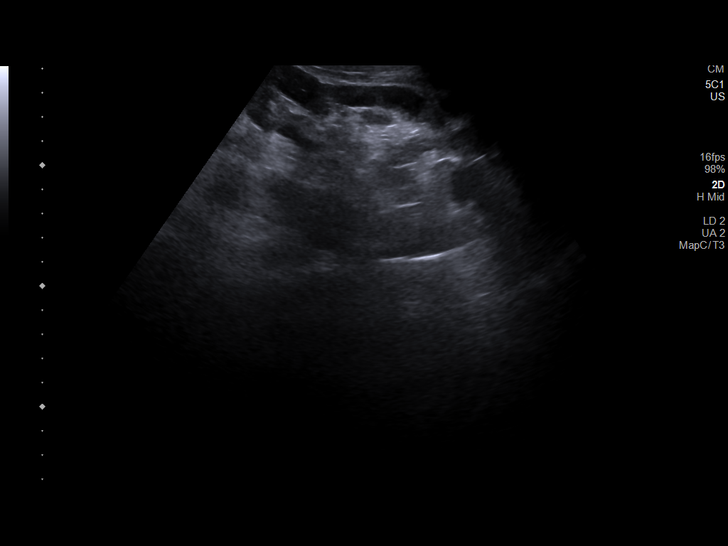

[14 of 25 positions shown; findings below may reference images not displayed]

FINDINGS: Right Kidney:

Renal measurements: 10.9 x 5.1 x 5.4 cm = volume: 157.5 mL. Contains
multiple cysts, less than 10. The largest in the lower pole measures
up to 4.4 cm. Another in the lower pole measures 2.8 cm.

Left Kidney:

Renal measurements: 12.4 x 5.9 x 6.8 cm = volume: 257.7 mL. Contains
cysts, less than 10. The largest in the midpole measures up to 5 cm.

Bladder:

Not seen due to lack of distention.

Other:

None.
IMPRESSION: Bilateral renal cysts.
